# Patient Record
Sex: Female | Born: 1935 | Race: White | Hispanic: No | Smoking: Never smoker
Health system: Southern US, Community
[De-identification: ages and names within clinical notes are randomized; demographics above are authoritative.]

## PROBLEM LIST (undated history)

## (undated) DIAGNOSIS — I639 Cerebral infarction, unspecified: Secondary | ICD-10-CM

## (undated) DIAGNOSIS — I10 Essential (primary) hypertension: Secondary | ICD-10-CM

## (undated) DIAGNOSIS — E7800 Pure hypercholesterolemia, unspecified: Secondary | ICD-10-CM

## (undated) HISTORY — PX: TONSILLECTOMY: SUR1361

---

## 2004-06-20 ENCOUNTER — Ambulatory Visit: Payer: Self-pay | Admitting: Internal Medicine

## 2004-06-24 ENCOUNTER — Ambulatory Visit: Payer: Self-pay | Admitting: Internal Medicine

## 2004-07-21 ENCOUNTER — Ambulatory Visit: Payer: Self-pay | Admitting: Internal Medicine

## 2005-06-23 ENCOUNTER — Ambulatory Visit: Payer: Self-pay | Admitting: Internal Medicine

## 2005-07-19 ENCOUNTER — Inpatient Hospital Stay: Payer: Self-pay | Admitting: Vascular Surgery

## 2005-08-17 ENCOUNTER — Ambulatory Visit: Payer: Self-pay | Admitting: Internal Medicine

## 2006-08-20 ENCOUNTER — Ambulatory Visit: Payer: Self-pay | Admitting: Internal Medicine

## 2007-07-26 ENCOUNTER — Ambulatory Visit: Payer: Self-pay | Admitting: Gastroenterology

## 2007-09-12 ENCOUNTER — Ambulatory Visit: Payer: Self-pay | Admitting: Internal Medicine

## 2008-10-07 ENCOUNTER — Ambulatory Visit: Payer: Self-pay | Admitting: Internal Medicine

## 2009-10-22 ENCOUNTER — Ambulatory Visit: Payer: Self-pay | Admitting: Internal Medicine

## 2010-10-24 ENCOUNTER — Ambulatory Visit: Payer: Self-pay | Admitting: Internal Medicine

## 2011-11-10 ENCOUNTER — Ambulatory Visit: Payer: Self-pay | Admitting: Internal Medicine

## 2012-11-11 ENCOUNTER — Ambulatory Visit: Payer: Self-pay

## 2013-03-10 ENCOUNTER — Emergency Department: Payer: Self-pay | Admitting: Emergency Medicine

## 2013-03-10 LAB — URINALYSIS, COMPLETE
Bacteria: NONE SEEN
Blood: NEGATIVE
Leukocyte Esterase: NEGATIVE
Ph: 5 (ref 4.5–8.0)
Specific Gravity: 1.012 (ref 1.003–1.030)

## 2013-03-10 LAB — CBC
MCH: 32.2 pg (ref 26.0–34.0)
MCHC: 33.9 g/dL (ref 32.0–36.0)
MCV: 95 fL (ref 80–100)
Platelet: 254 10*3/uL (ref 150–440)
RBC: 4.43 10*6/uL (ref 3.80–5.20)
WBC: 8.8 10*3/uL (ref 3.6–11.0)

## 2013-03-10 LAB — COMPREHENSIVE METABOLIC PANEL
Alkaline Phosphatase: 144 U/L — ABNORMAL HIGH (ref 50–136)
Bilirubin,Total: 0.7 mg/dL (ref 0.2–1.0)
Creatinine: 1.24 mg/dL (ref 0.60–1.30)
EGFR (Non-African Amer.): 39 — ABNORMAL LOW
Osmolality: 274 (ref 275–301)
Sodium: 136 mmol/L (ref 136–145)

## 2013-03-10 LAB — LIPASE, BLOOD: Lipase: 207 U/L (ref 73–393)

## 2013-12-17 ENCOUNTER — Observation Stay: Payer: Self-pay

## 2013-12-17 LAB — BASIC METABOLIC PANEL
ANION GAP: 6 — AB (ref 7–16)
BUN: 19 mg/dL — ABNORMAL HIGH (ref 7–18)
CREATININE: 1.16 mg/dL (ref 0.60–1.30)
Calcium, Total: 8.9 mg/dL (ref 8.5–10.1)
Chloride: 105 mmol/L (ref 98–107)
Co2: 31 mmol/L (ref 21–32)
EGFR (African American): 49 — ABNORMAL LOW
EGFR (Non-African Amer.): 42 — ABNORMAL LOW
Glucose: 103 mg/dL — ABNORMAL HIGH (ref 65–99)
OSMOLALITY: 286 (ref 275–301)
Potassium: 4.6 mmol/L (ref 3.5–5.1)
Sodium: 142 mmol/L (ref 136–145)

## 2013-12-17 LAB — CBC WITH DIFFERENTIAL/PLATELET
BASOS PCT: 0.7 %
Basophil #: 0 10*3/uL (ref 0.0–0.1)
Eosinophil #: 0.1 10*3/uL (ref 0.0–0.7)
Eosinophil %: 2.2 %
HCT: 41 % (ref 35.0–47.0)
HGB: 13.6 g/dL (ref 12.0–16.0)
LYMPHS ABS: 1.2 10*3/uL (ref 1.0–3.6)
Lymphocyte %: 18.4 %
MCH: 32.5 pg (ref 26.0–34.0)
MCHC: 33.2 g/dL (ref 32.0–36.0)
MCV: 98 fL (ref 80–100)
MONO ABS: 0.5 x10 3/mm (ref 0.2–0.9)
Monocyte %: 8.5 %
Neutrophil #: 4.5 10*3/uL (ref 1.4–6.5)
Neutrophil %: 70.2 %
Platelet: 249 10*3/uL (ref 150–440)
RBC: 4.18 10*6/uL (ref 3.80–5.20)
RDW: 13.6 % (ref 11.5–14.5)
WBC: 6.4 10*3/uL (ref 3.6–11.0)

## 2013-12-17 LAB — URINALYSIS, COMPLETE
Bacteria: NONE SEEN
Bilirubin,UR: NEGATIVE
Blood: NEGATIVE
Glucose,UR: NEGATIVE mg/dL (ref 0–75)
KETONE: NEGATIVE
Leukocyte Esterase: NEGATIVE
Nitrite: NEGATIVE
PH: 8 (ref 4.5–8.0)
Protein: NEGATIVE
RBC,UR: NONE SEEN /HPF (ref 0–5)
SPECIFIC GRAVITY: 1.005 (ref 1.003–1.030)
Squamous Epithelial: NONE SEEN

## 2013-12-17 LAB — TROPONIN I: Troponin-I: <0.02 ng/mL

## 2013-12-17 LAB — LIPID PANEL
Cholesterol: 154 mg/dL (ref 0–200)
HDL: 41 mg/dL (ref 40–60)
Ldl Cholesterol, Calc: 81 mg/dL (ref 0–100)
Triglycerides: 160 mg/dL (ref 0–200)
VLDL Cholesterol, Calc: 32 mg/dL (ref 5–40)

## 2013-12-18 LAB — CBC WITH DIFFERENTIAL/PLATELET
BASOS ABS: 0 10*3/uL (ref 0.0–0.1)
BASOS PCT: 0.9 %
Eosinophil #: 0.2 10*3/uL (ref 0.0–0.7)
Eosinophil %: 4.4 %
HCT: 37.4 % (ref 35.0–47.0)
HGB: 12.3 g/dL (ref 12.0–16.0)
LYMPHS ABS: 1.4 10*3/uL (ref 1.0–3.6)
Lymphocyte %: 26.6 %
MCH: 31.3 pg (ref 26.0–34.0)
MCHC: 32.9 g/dL (ref 32.0–36.0)
MCV: 95 fL (ref 80–100)
MONOS PCT: 10.9 %
Monocyte #: 0.6 x10 3/mm (ref 0.2–0.9)
NEUTROS ABS: 3 10*3/uL (ref 1.4–6.5)
Neutrophil %: 57.2 %
PLATELETS: 214 10*3/uL (ref 150–440)
RBC: 3.93 10*6/uL (ref 3.80–5.20)
RDW: 13.1 % (ref 11.5–14.5)
WBC: 5.3 10*3/uL (ref 3.6–11.0)

## 2013-12-18 LAB — BASIC METABOLIC PANEL
ANION GAP: 7 (ref 7–16)
BUN: 14 mg/dL (ref 7–18)
Calcium, Total: 8.8 mg/dL (ref 8.5–10.1)
Chloride: 107 mmol/L (ref 98–107)
Co2: 28 mmol/L (ref 21–32)
Creatinine: 1.15 mg/dL (ref 0.60–1.30)
EGFR (African American): 50 — ABNORMAL LOW
GFR CALC NON AF AMER: 43 — AB
GLUCOSE: 84 mg/dL (ref 65–99)
Osmolality: 283 (ref 275–301)
Potassium: 3.8 mmol/L (ref 3.5–5.1)
Sodium: 142 mmol/L (ref 136–145)

## 2014-10-03 NOTE — H&P (Signed)
PATIENT NAME:  Cheyenne Boone, Cheyenne Boone MR#:  [id number] DATE OF BIRTH:  [birth year]  DATE OF ADMISSION:  12/17/2013  ADMITTING PHYSICIAN: Enid Baas, MD   PRIMARY CARE PHYSICIAN: Stann Mainland. Sampson Goon, MD  CHIEF COMPLAINT: Confusion and dizziness.     HISTORY OF PRESENT ILLNESS: Cheyenne Boone is an 81 year old, very pleasant Caucasian female with a past medical history significant for hypertension, hyperlipidemia and transient ischemic attack in the past, comes to the hospital secondary to confusion that started this morning. The patient states she lives at home by herself. She went to use the restroom this morning. She actually uses a bedside commode. After using the commode, she got confused getting back to bed, which was right next to her.  Even now, if you ask detailed questions, the patient cannot remember things, which is acute onset according to the patient, and sometimes knows the words but just cannot speak them out.  She is getting very emotional because of this. Also, she is noted to have elevated blood pressure, systolic greater than 220s and diastolic in 6 110 region so she is being admitted for possible transient ischemic attack.    PAST MEDICAL HISTORY: 1.  Hypertension.  2.  Hyperlipidemia.  3.  History of transient ischemic attack.    PAST SURGICAL HISTORY: Carotid endarterectomy.   ALLERGIES TO MEDICATIONS: No known drug allergies.   CURRENT HOME MEDICATIONS:  Not known at this time.   SOCIAL HISTORY: Lives at home by herself. No smoking or alcohol use.   FAMILY HISTORY: The patient is not sure.    REVIEW OF SYSTEMS:    CONSTITUTIONAL: Denies fever, fatigue or weakness.  EYES: Complains of blurred vision. No inflammation or glaucoma. Had left eye cataract taken out.  ENT: No tinnitus,positive for hearing loss, No epistaxis or discharge.  RESPIRATORY: No cough, wheeze, hemoptysis or chronic obstructive pulmonary disease.  CARDIOVASCULAR: No chest pain, orthopnea, edema,  arrhythmia, palpitations or syncope.  GASTROINTESTINAL: No nausea, vomiting, diarrhea, abdominal pain, hematemesis or melena.  GENITOURINARY: No dysuria, hematuria, renal calculus, frequency or incontinence.  ENDOCRINE: No polyuria, nocturia, thyroid problems, heat or cold intolerance.  HEMATOLOGY: No anemia, easy bruising or bleeding.  SKIN: No acne, rash or lesions.  MUSCULOSKELETAL: No neck, back, shoulder pain, arthritis or gout.  NEUROLOGIC: No numbness, weakness or cerebrovascular accident. History of transient ischemic attack present. Positive for dizziness now.   PSYCHOLOGICAL: No anxiety, insomnia, depression.   PHYSICAL EXAMINATION: VITAL SIGNS: Temperature 98.2 degrees Fahrenheit, pulse 63, respirations 18, blood pressure 224/105, pulse oximetry 96% on room air.  GENERAL: Well-built, well-nourished female lying in bed, not in any acute distress.  HEENT: Normocephalic, atraumatic. Pupils equal, round, reacting to light. Left pupil is postsurgical, mild erythematous conjunctiva noted. Extraocular movements are intact. Oropharynx clear without erythema, mass or exudates.  NECK: Supple. No thyromegaly, JVD or carotid bruits. No lymphadenopathy.  LUNGS: Moving air bilaterally. No wheeze or crackles. No use of accessory muscles for breathing.  CARDIOVASCULAR: S1, S2, regular rate and rhythm. A 2/6 systolic murmur present. No rubs or gallops.  ABDOMEN: Soft, nontender, nondistended. No hepatosplenomegaly. Normal bowel sounds.  EXTREMITIES: No pedal edema. No clubbing or cyanosis. Has 2+ dorsalis pedis pulses felt bilaterally.  SKIN: No acne, rash or lesions.  LYMPHATICS: No cervical or inguinal lymphadenopathy.  NEUROLOGIC: Cranial nerves intact. No focal motor or sensory deficits.  PSYCHOLOGICAL: The patient is awake, alert, oriented x 3.   LABORATORY, DIAGNOSTIC AND RADIOLOGICAL DATA: 1.  WBC 6.4, hemoglobin 13.6, hematocrit 41.0, platelet  count 249.  2.  Sodium 142, potassium 4.6,  chloride 105, bicarbonate 31, BUN 19, creatinine 1.16,  glucose 103, calcium of 8.9. Troponin less than 0.02.  3.  Urinalysis negative for any infection.  4.  CT of the head showing no acute intracranial abnormality. Diffuse areas of low attenuation indicating small vessel white matter ischemic changes which are chronic.  5.  EKG showing normal sinus rhythm. No acute ST-T wave changes.   ASSESSMENT AND PLAN: [name] with past medical history significant for hypertension, hyperlipidemia, being admitted for acute onset of confusion and dizziness and also noted to have elevated blood pressure.  1.  Possible transient ischemic attack. Check lipid profile, admit to telemetry. Will get MRI of the brain, carotid Dopplers and echocardiogram. Continue aspirin, add statin until home medications are verified. Also deep vein thrombosis prophylaxis with subcutaneous heparin.  2.  Malignant hypertension. The patient states her blood pressure medication has been stopped recently, about 6 months ago, as her blood pressure has not been this high before. She is getting p.o. clonidine stat in the ER, will be placed on IV hydralazine p.r.n. at this time.  3.  Hyperlipidemia. Add statin and check lipid profile.  4.  Deep vein thrombosis prophylaxis with subcu heparin.  5.  CODE STATUS: Full code.   TIME SPENT ON ADMISSION: 50 minutes.    ____________________________ Enid Baasadhika Myeesha Shane, MD rk:cs D: 12/17/2013 19:10:34 ET T: 12/17/2013 19:42:01 ET JOB#: [id number]  cc: Enid Baasadhika Hazleigh Mccleave, MD, <Dictator> Stann Mainlandavid P. Sampson GoonFitzgerald, MD Enid BaasADHIKA Letecia Arps MD ELECTRONICALLY SIGNED 12/18/2013 10:51

## 2014-10-03 NOTE — Discharge Summary (Signed)
PATIENT NAME:  Cheyenne Boone, Abbee M MR#:  [id number] DATE OF BIRTH:  [birth year]  DATE OF ADMISSION:  12/17/2013 DATE OF DISCHARGE:  12/19/2013  DISCHARGE DIAGNOSES: 1.  Malignant hypertension.  2.  Transient ischemic attack.   HISTORY OF PRESENT ILLNESS: Please see initial history and physical for details. Briefly, this is an 81 year old female admitted with symptoms of TIA as well as markedly elevated blood pressure.   HOSPITAL COURSE BY ISSUE: The patient's blood pressure was controlled with oral medications. Her work-up for TIA included a MRI which was negative for an acute CVA, carotid Dopplers and echocardiogram. For her blood pressure, she was started on Norvasc at low-dose, 2.5 mg. She was discharged home with home physical therapy and nursing. She is to continue a low sodium diet. She will follow up with Dr. Sampson GoonFitzgerald in 1 to 2 weeks.   TIME SPENT: 35 minutes.   ____________________________ Stann Mainlandavid P. Sampson GoonFitzgerald, MD dpf:sb D: 12/29/2013 11:40:12 ET T: 12/29/2013 11:55:30 ET JOB#: [id number]  cc: Stann Mainlandavid P. Sampson GoonFitzgerald, MD, <Dictator> Jaymie Mckiddy Sampson GoonFITZGERALD MD ELECTRONICALLY SIGNED 12/29/2013 21:25

## 2014-10-31 ENCOUNTER — Emergency Department: Payer: Commercial Managed Care - HMO

## 2014-10-31 ENCOUNTER — Encounter: Payer: Self-pay | Admitting: Emergency Medicine

## 2014-10-31 ENCOUNTER — Inpatient Hospital Stay
Admission: EM | Admit: 2014-10-31 | Discharge: 2014-11-05 | DRG: 481 | Disposition: A | Discharge status: Skilled Nursing Facility | Payer: Commercial Managed Care - HMO | Attending: Internal Medicine | Admitting: Internal Medicine

## 2014-10-31 DIAGNOSIS — R4189 Other symptoms and signs involving cognitive functions and awareness: Secondary | ICD-10-CM

## 2014-10-31 DIAGNOSIS — M810 Age-related osteoporosis without current pathological fracture: Secondary | ICD-10-CM | POA: Diagnosis present

## 2014-10-31 DIAGNOSIS — M25552 Pain in left hip: Secondary | ICD-10-CM | POA: Diagnosis present

## 2014-10-31 DIAGNOSIS — Z8673 Personal history of transient ischemic attack (TIA), and cerebral infarction without residual deficits: Secondary | ICD-10-CM | POA: Diagnosis not present

## 2014-10-31 DIAGNOSIS — S72002A Fracture of unspecified part of neck of left femur, initial encounter for closed fracture: Secondary | ICD-10-CM

## 2014-10-31 DIAGNOSIS — Y92008 Other place in unspecified non-institutional (private) residence as the place of occurrence of the external cause: Secondary | ICD-10-CM

## 2014-10-31 DIAGNOSIS — E785 Hyperlipidemia, unspecified: Secondary | ICD-10-CM | POA: Diagnosis present

## 2014-10-31 DIAGNOSIS — R52 Pain, unspecified: Secondary | ICD-10-CM

## 2014-10-31 DIAGNOSIS — F419 Anxiety disorder, unspecified: Secondary | ICD-10-CM | POA: Diagnosis present

## 2014-10-31 DIAGNOSIS — W1830XA Fall on same level, unspecified, initial encounter: Secondary | ICD-10-CM | POA: Diagnosis present

## 2014-10-31 DIAGNOSIS — D696 Thrombocytopenia, unspecified: Secondary | ICD-10-CM | POA: Diagnosis not present

## 2014-10-31 DIAGNOSIS — S72009A Fracture of unspecified part of neck of unspecified femur, initial encounter for closed fracture: Secondary | ICD-10-CM | POA: Diagnosis present

## 2014-10-31 DIAGNOSIS — I10 Essential (primary) hypertension: Secondary | ICD-10-CM | POA: Diagnosis present

## 2014-10-31 DIAGNOSIS — D62 Acute posthemorrhagic anemia: Secondary | ICD-10-CM | POA: Diagnosis not present

## 2014-10-31 DIAGNOSIS — Y93E2 Activity, laundry: Secondary | ICD-10-CM | POA: Diagnosis not present

## 2014-10-31 DIAGNOSIS — S72142A Displaced intertrochanteric fracture of left femur, initial encounter for closed fracture: Secondary | ICD-10-CM | POA: Diagnosis present

## 2014-10-31 DIAGNOSIS — Z01811 Encounter for preprocedural respiratory examination: Secondary | ICD-10-CM

## 2014-10-31 DIAGNOSIS — R4182 Altered mental status, unspecified: Secondary | ICD-10-CM | POA: Diagnosis not present

## 2014-10-31 HISTORY — DX: Pure hypercholesterolemia, unspecified: E78.00

## 2014-10-31 HISTORY — DX: Essential (primary) hypertension: I10

## 2014-10-31 LAB — URINALYSIS COMPLETE WITH MICROSCOPIC (ARMC ONLY)
BILIRUBIN URINE: NEGATIVE
Bacteria, UA: NONE SEEN
GLUCOSE, UA: NEGATIVE mg/dL
Hgb urine dipstick: NEGATIVE
LEUKOCYTES UA: NEGATIVE
NITRITE: NEGATIVE
PH: 9 — AB (ref 5.0–8.0)
Protein, ur: NEGATIVE mg/dL
RBC / HPF: NONE SEEN RBC/hpf (ref 0–5)
Specific Gravity, Urine: 1.009 (ref 1.005–1.030)
Squamous Epithelial / LPF: NONE SEEN
WBC, UA: NONE SEEN WBC/hpf (ref 0–5)

## 2014-10-31 LAB — BASIC METABOLIC PANEL
Anion gap: 8 (ref 5–15)
BUN: 22 mg/dL — ABNORMAL HIGH (ref 6–20)
CHLORIDE: 105 mmol/L (ref 101–111)
CO2: 29 mmol/L (ref 22–32)
CREATININE: 1.24 mg/dL — AB (ref 0.44–1.00)
Calcium: 9.2 mg/dL (ref 8.9–10.3)
GFR calc Af Amer: 44 mL/min — ABNORMAL LOW (ref >60)
GFR, EST NON AFRICAN AMERICAN: 38 mL/min — AB (ref >60)
Glucose, Bld: 105 mg/dL — ABNORMAL HIGH (ref 65–99)
POTASSIUM: 3.7 mmol/L (ref 3.5–5.1)
SODIUM: 142 mmol/L (ref 135–145)

## 2014-10-31 LAB — CBC
HCT: 41 % (ref 35.0–47.0)
Hemoglobin: 13.6 g/dL (ref 12.0–16.0)
MCH: 31.3 pg (ref 26.0–34.0)
MCHC: 33.2 g/dL (ref 32.0–36.0)
MCV: 94.3 fL (ref 80.0–100.0)
PLATELETS: 241 10*3/uL (ref 150–440)
RBC: 4.35 MIL/uL (ref 3.80–5.20)
RDW: 13.3 % (ref 11.5–14.5)
WBC: 4.8 10*3/uL (ref 3.6–11.0)

## 2014-10-31 LAB — TROPONIN I: Troponin I: <0.03 ng/mL (ref <0.031)

## 2014-10-31 MED ORDER — POLYETHYLENE GLYCOL 3350 17 G PO PACK
17.0000 g | PACK | Freq: Every day | ORAL | Status: DC | PRN
  Administered 2014-11-01 – 2014-11-05 (×2): 17 g via ORAL
  Filled 2014-10-31 (×2): qty 1

## 2014-10-31 MED ORDER — CALCIUM-D 600-400 MG-UNIT PO TABS: 1.0000 | ORAL_TABLET | Freq: Every day | ORAL | Status: DC

## 2014-10-31 MED ORDER — LATANOPROST 0.005 % OP SOLN
1.0000 [drp] | Freq: Every day | OPHTHALMIC | Status: DC
  Administered 2014-10-31 – 2014-11-04 (×5): 1 [drp] via OPHTHALMIC
  Filled 2014-10-31: qty 2.5

## 2014-10-31 MED ORDER — ALPRAZOLAM 0.25 MG PO TABS
0.2500 mg | ORAL_TABLET | Freq: Two times a day (BID) | ORAL | Status: DC | PRN
  Administered 2014-10-31 – 2014-11-05 (×7): 0.25 mg via ORAL
  Filled 2014-10-31 (×7): qty 1

## 2014-10-31 MED ORDER — POLYETHYLENE GLYCOL 3350 17 GM/SCOOP PO POWD: 1.0000 | Freq: Every day | ORAL | Status: DC | PRN

## 2014-10-31 MED ORDER — ONDANSETRON HCL 4 MG/2ML IJ SOLN: 4.0000 mg | Freq: Four times a day (QID) | INTRAMUSCULAR | Status: DC | PRN

## 2014-10-31 MED ORDER — CALCIUM CARBONATE-VITAMIN D 500-200 MG-UNIT PO TABS
1.0000 | ORAL_TABLET | Freq: Every day | ORAL | Status: DC
  Administered 2014-10-31 – 2014-11-05 (×4): 1 via ORAL
  Filled 2014-10-31 (×5): qty 1

## 2014-10-31 MED ORDER — DOCUSATE SODIUM 100 MG PO CAPS: 100.0000 mg | ORAL_CAPSULE | Freq: Every day | ORAL | Status: DC | PRN

## 2014-10-31 MED ORDER — SODIUM CHLORIDE 0.9 % IV SOLN
INTRAVENOUS | Status: DC
  Administered 2014-10-31: 17:00:00 via INTRAVENOUS
  Administered 2014-11-01: 1000 mL via INTRAVENOUS
  Administered 2014-11-01 (×2): via INTRAVENOUS

## 2014-10-31 MED ORDER — HYDROCHLOROTHIAZIDE 12.5 MG PO CAPS
12.5000 mg | ORAL_CAPSULE | ORAL | Status: DC
  Administered 2014-11-03 – 2014-11-05 (×3): 12.5 mg via ORAL
  Filled 2014-10-31 (×4): qty 1

## 2014-10-31 MED ORDER — MORPHINE SULFATE 2 MG/ML IJ SOLN
2.0000 mg | INTRAMUSCULAR | Status: DC | PRN
  Administered 2014-10-31 – 2014-11-03 (×4): 2 mg via INTRAVENOUS
  Filled 2014-10-31 (×4): qty 1

## 2014-10-31 MED ORDER — CEFAZOLIN SODIUM 1-5 GM-% IV SOLN
1.0000 g | Freq: Once | INTRAVENOUS | Status: DC
  Filled 2014-10-31: qty 50

## 2014-10-31 MED ORDER — ACETAMINOPHEN 650 MG RE SUPP: 650.0000 mg | Freq: Four times a day (QID) | RECTAL | Status: DC | PRN

## 2014-10-31 MED ORDER — ONDANSETRON HCL 4 MG PO TABS: 4.0000 mg | ORAL_TABLET | Freq: Four times a day (QID) | ORAL | Status: DC | PRN

## 2014-10-31 MED ORDER — SIMVASTATIN 20 MG PO TABS
20.0000 mg | ORAL_TABLET | Freq: Every day | ORAL | Status: DC
  Administered 2014-10-31 – 2014-11-04 (×5): 20 mg via ORAL
  Filled 2014-10-31 (×5): qty 1

## 2014-10-31 MED ORDER — FENTANYL CITRATE (PF) 100 MCG/2ML IJ SOLN
INTRAMUSCULAR | Status: AC
  Administered 2014-10-31: 50 ug via INTRAVENOUS
  Filled 2014-10-31: qty 2

## 2014-10-31 MED ORDER — ASPIRIN EC 81 MG PO TBEC
81.0000 mg | DELAYED_RELEASE_TABLET | ORAL | Status: DC
  Administered 2014-11-02 – 2014-11-05 (×4): 81 mg via ORAL
  Filled 2014-10-31 (×4): qty 1

## 2014-10-31 MED ORDER — ONDANSETRON HCL 4 MG/2ML IJ SOLN: 4.0000 mg | Freq: Once | INTRAMUSCULAR | Status: DC

## 2014-10-31 MED ORDER — FENTANYL CITRATE (PF) 100 MCG/2ML IJ SOLN
50.0000 ug | Freq: Once | INTRAMUSCULAR | Status: AC
Start: 2014-10-31 — End: 2014-10-31
  Administered 2014-10-31: 50 ug via INTRAVENOUS

## 2014-10-31 MED ORDER — MORPHINE SULFATE 2 MG/ML IJ SOLN: 2.0000 mg | Freq: Once | INTRAMUSCULAR | Status: DC

## 2014-10-31 MED ORDER — ACETAMINOPHEN 325 MG PO TABS: 650.0000 mg | ORAL_TABLET | Freq: Four times a day (QID) | ORAL | Status: DC | PRN

## 2014-10-31 MED ORDER — AMLODIPINE BESYLATE 5 MG PO TABS
2.5000 mg | ORAL_TABLET | ORAL | Status: DC
  Administered 2014-11-01: 2.5 mg via ORAL
  Administered 2014-11-03: 07:00:00 via ORAL
  Administered 2014-11-04 – 2014-11-05 (×2): 2.5 mg via ORAL
  Filled 2014-10-31 (×5): qty 1

## 2014-10-31 NOTE — ED Notes (Signed)
Patient lost her balance doing laundry this AM and fell. Patient c/o left hip pain. Unable to move LLE.

## 2014-10-31 NOTE — Consult Note (Signed)
Reason for consult, left comminuted intertrochanteric hip fracture  History patient suffered a fall at home today she is uncertain as to the reason but denies loss of consciousness. She is normally active and takes care of herself. She does use a cane. 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 She denies prodromal symptoms of left hip pain.  Past history is not really pertinent she has not had any prior orthopedic treatments.  Examination: Skin around the thigh is intact on the left with the leg markedly shortened and externally rotated approximately 45. Sensation to the foot is intact with brisk capillary refill and palpable dorsalis pedis and posterior tibial pulses. There is no peripheral edema.  X-ray examination shows a very comminuted fracture with subtrochanteric extension of the intertrochanteric fracture consistent with a very unstable fracture pattern.  Impression complex proximal femur fracture  Plan is for several medullary device fixation with expectation of some shortening along with healing. risks, benefits, possible complications were discussed with the patient in particular blood clots and infection, failure of fixation and shortening of the leg.

## 2014-10-31 NOTE — ED Notes (Signed)
Pt laying in bed with family at bedside. Questions answered. Will ask for update by doctor. Vitals reassessed. Will continue to monitor.

## 2014-10-31 NOTE — H&P (Addendum)
[facility] [place] [facility]   PATIENT NAME: Cheyenne Boone    MR#:  [id number]  DATE OF BIRTH:  01-28-1924  DATE OF ADMISSION:  10/31/2014  PRIMARY CARE PHYSICIAN:  Dr. Marcello Fennel    REQUESTING/REFERRING PHYSICIAN: Dr. Shaune Pollack  CHIEF COMPLAINT:   Chief Complaint  Patient presents with  . Hip Pain   S/p Fall and left hip pain and noted to have a left hip fracture.   HISTORY OF PRESENT ILLNESS:  Cheyenne Boone  is a 81 y.o. female with a known history of hypertension, hyperlipidemia, osteoporosis, history of previous CVA, who presented to the hospital after a mechanical fall and noted to have a left hip fracture. Patient denied any prodromal symptoms prior to her fall like any chest pain shortness of breath palpitations dizziness or any true syncope.  Patient basically lost her footing and fell to the floor and complained of left hip pain and was brought to the emergency room and noted to have a left hip fracture.  PAST MEDICAL HISTORY:   Past Medical History  Diagnosis Date  . Hypertension   . Hypercholesteremia     PAST SURGICAL HISTORY:  History reviewed. No pertinent past surgical history.  SOCIAL HISTORY:   History  Substance Use Topics  . Smoking status: Never Smoker   . Smokeless tobacco: Not on file  . Alcohol Use: No    FAMILY HISTORY:   No significant family hx.  Both mother & father died from old age.   DRUG ALLERGIES:  No Known Allergies  REVIEW OF SYSTEMS:   Review of Systems  Constitutional: Negative for fever and weight loss.  HENT: Negative for congestion, nosebleeds and tinnitus.   Eyes: Negative for blurred vision, double vision and redness.  Respiratory: Negative for cough, hemoptysis and shortness of breath.   Cardiovascular: Negative for chest pain, orthopnea, leg swelling and PND.  Gastrointestinal: Negative for nausea, vomiting, abdominal pain, diarrhea and melena.  Genitourinary: Negative for dysuria,  urgency and hematuria.  Musculoskeletal: Negative for joint pain and falls.  Neurological: Negative for dizziness, tingling, sensory change, focal weakness, seizures, weakness and headaches.  Endo/Heme/Allergies: Negative for polydipsia. Does not bruise/bleed easily.  Psychiatric/Behavioral: Negative for depression and memory loss. The patient is not nervous/anxious.     MEDICATIONS AT HOME:   Prior to Admission medications   Medication Sig Start Date End Date Taking? Authorizing Provider  ALPRAZolam (XANAX) 0.25 MG tablet Take 0.25 mg by mouth 2 (two) times daily as needed. For sleep 09/07/14  Yes Historical Provider, MD  amLODipine (NORVASC) 2.5 MG tablet Take 2.5 mg by mouth every morning. 08/06/14  Yes Historical Provider, MD  aspirin EC 81 MG tablet Take 81 mg by mouth every morning.   Yes Historical Provider, MD  Calcium Carbonate-Vitamin D (CALCIUM-D) 600-400 MG-UNIT TABS Take 1 tablet by mouth daily.   Yes Historical Provider, MD  docusate sodium (COLACE) 100 MG capsule Take 100 mg by mouth daily as needed for mild constipation.   Yes Historical Provider, MD  hydrochlorothiazide (MICROZIDE) 12.5 MG capsule Take 12.5 mg by mouth every morning. 08/06/14  Yes Historical Provider, MD  latanoprost (XALATAN) 0.005 % ophthalmic solution Place 1 drop into the left eye at bedtime. 08/20/14  Yes Historical Provider, MD  polyethylene glycol powder (GLYCOLAX/MIRALAX) powder Take 1 Container by mouth daily as needed for mild constipation or moderate constipation.   Yes Historical Provider, MD  simvastatin (ZOCOR) 20 MG tablet Take 20 mg by mouth at bedtime.  08/06/14  Yes Historical Provider, MD      VITAL SIGNS:  Blood pressure 105/49, pulse 67, temperature 98.1 F (36.7 C), temperature source Oral, resp. rate 20, height 4' (1.219 m), weight 53.071 kg (117 lb), SpO2 100 %.  PHYSICAL EXAMINATION:  Physical Exam  GENERAL:  81 y.o.-year-old patient lying in the bed with no acute distress.  EYES:  Pupils equal, round, reactive to light and accommodation. No scleral icterus. Extraocular muscles intact.  HEENT: Head atraumatic, normocephalic. Oropharynx and nasopharynx clear.  No oropharyngeal erythema, moist oral mucosa.  NECK:  Supple, no jugular venous distention. No thyroid enlargement, no tenderness. No Lymphadenopathy.  LUNGS: Normal breath sounds bilaterally, no wheezing, rales, rhonchi. No use of accessory muscles of respiration.  CARDIOVASCULAR: S1, S2 normal. + II/VI SEM at RSB, no rubs, gallops or clicks.  ABDOMEN: Soft, nontender, nondistended. Bowel sounds present. No organomegaly or mass.  EXTREMITIES: No pedal edema, cyanosis, or clubbing.  Left lower ext. Externally rotated and shortened due to hip fracutre. +2 pedal and radial pulses bilaterally. NEUROLOGIC: Cranial nerves II through XII are intact. No focal Motor or sensory deficits appreciated b/l PSYCHIATRIC: The patient is alert and oriented x 3. Good affect.  SKIN: No obvious rash, lesion, or ulcer.   LABORATORY PANEL:   CBC  Recent Labs Lab 10/31/14 0912  WBC 4.8  HGB 13.6  HCT 41.0  PLT 241   ------------------------------------------------------------------------------------------------------------------  Chemistries   Recent Labs Lab 10/31/14 0912  NA 142  K 3.7  CL 105  CO2 29  GLUCOSE 105*  BUN 22*  CREATININE 1.24*  CALCIUM 9.2   ------------------------------------------------------------------------------------------------------------------  Cardiac Enzymes  Recent Labs Lab 10/31/14 0912  TROPONINI <0.03   ------------------------------------------------------------------------------------------------------------------  RADIOLOGY:  Dg Chest 1 View  10/31/2014   CLINICAL DATA:  Left intertrochanteric femoral neck fracture due to a fall and her laundry room at home earlier today. Preoperative respiratory evaluation.  EXAM: CHEST  1 VIEW  COMPARISON:  No prior chest x-ray.  Visualized lung bases on CT abdomen 03/10/2013.  FINDINGS: Cardiac silhouette upper normal in size to mildly enlarged for the supine technique. Thoracic aorta atherosclerotic. Large hiatal hernia as noted previously. Hilar and mediastinal contours otherwise unremarkable. Lungs clear. Bronchovascular markings normal. Pulmonary vascularity normal. No visible pleural effusions. No pneumothorax. Old healed fractures involving right ribs. Generalized osseous demineralization.  IMPRESSION: 1. Borderline to mild cardiomegaly. No acute cardiopulmonary disease. 2. Large hiatal hernia.   Electronically Signed   By: Hulan Saas M.D.   On: 10/31/2014 09:54   Dg Hip Unilat With Pelvis 2-3 Views Left  10/31/2014   CLINICAL DATA:  81 year old who fell earlier today while at home in her laundry room. Injury to the left hip with inability to move the left lower extremity. Initial encounter.  EXAM: LEFT HIP (WITH PELVIS) 2-3 VIEWS  COMPARISON:  None.  FINDINGS: Comminuted intertrochanteric left femoral neck fracture. Severe osseous demineralization. Mild joint space narrowing.  Included AP pelvis demonstrates no fractures elsewhere. Symmetric mild joint space narrowing in the contralateral right hip. Sacroiliac joints and symphysis pubis intact. Aortoiliofemoral atherosclerosis.  IMPRESSION: Comminuted intertrochanteric left femoral neck fracture. Severe osseous demineralization.   Electronically Signed   By: Hulan Saas M.D.   On: 10/31/2014 09:52    EKG:                   IMPRESSION AND PLAN:   81 year old female with past medical history of hypertension, hyperlipidemia, osteoporosis, history of previous CVA, anxiety, who  presented to the hospital after a mechanical fall and noted to have a left hip fracture.  #1 preoperative medical evaluation - patient is likely a low to moderate risk for noncardiac surgery. There is no absolute contraindication to surgery at this time. Patient's EKG has been reviewed  and shows no acute ST or T-wave changes.  - I would proceed with surgery as per orthopedics  #2 left hip fracture - get orthopedic consult and continue care as per them  #3 hypertension stable and controlled - continue hydrochlorothiazide and amlodipine  #4 anxiety continue as needed Xanax  #5 hyperlipidemia continue simvastatin  #6 Glaucoma - continue with latanoprost eyedrops    All the records are reviewed and case discussed with ED provider. Management plans discussed with the patient, family and they are in agreement.  CODE STATUS: Full  TOTAL TIME TAKING CARE OF THIS PATIENT: 45 minutes.    Houston SirenSAINANI,VIVEK J M.D on 10/31/2014 at 12:53 PM  Between 7am to 6pm - Pager - [phone]  After 6pm go to [email or web address] - password [email or web address] [facility]  [facility]  Office  [phone]  CC: Primary care physician; No primary care provider on file.

## 2014-10-31 NOTE — ED Notes (Signed)
Patient transported to X-ray 

## 2014-10-31 NOTE — ED Notes (Signed)
Patient oxygen saturations decreased to 86%. Patient placed on 3L [place] and oxygen saturations elevated to 93%. Shaune PollackLord, MD notified.

## 2014-10-31 NOTE — ED Provider Notes (Signed)
[facility] Emergency Department Provider Note   ____________________________________________  Time seen: On arrival I have reviewed the triage vital signs and the triage nursing note.  HISTORY  Chief Complaint Hip Pain   Historian Patient  HPI Cheyenne Boone is a 81 y.o. female who came in with left hip pain after a fall from standing. She lost her balance doing laundry this morning. She is having moderate pain. Location is left hip. There are no other traumatic injuries including no head injury neck injury chest or abdominal injury.    Past Medical History  Diagnosis Date  . Hypertension   . Hypercholesteremia     There are no active problems to display for this patient.   History reviewed. No pertinent past surgical history.  No current outpatient prescriptions on file.  Allergies Review of patient's allergies indicates no known allergies.  History reviewed. No pertinent family history.  Social History History  Substance Use Topics  . Smoking status: Never Smoker   . Smokeless tobacco: Not on file  . Alcohol Use: No    Review of Systems  Constitutional: Negative for fever. Eyes: Negative for visual changes. ENT: Negative for sore throat. Cardiovascular: Negative for chest pain. Respiratory: Negative for shortness of breath. Gastrointestinal: Negative for abdominal pain, vomiting and diarrhea. Genitourinary: Negative for dysuria. Musculoskeletal: Negative for back pain. Skin: Negative for rash. Neurological: Negative for headaches, focal weakness or numbness.  ____________________________________________   PHYSICAL EXAM:  VITAL SIGNS: ED Triage Vitals  Enc Vitals Group     BP 10/31/14 0903 181/98 mmHg     Pulse Rate 10/31/14 0903 88     Resp 10/31/14 0903 16     Temp 10/31/14 0903 98.1 F (36.7 C)     Temp Source 10/31/14 0903 Oral     SpO2 10/31/14 0903 98 %     Weight 10/31/14 0903 117 lb (53.071 kg)     Height  10/31/14 0903 4' (1.219 m)     Head Cir --      Peak Flow --      Pain Score --      Pain Loc --      Pain Edu? --      Excl. in GC? --      Constitutional: Alert and oriented. Well appearing and in no distress. Heart appearing Eyes: Conjunctivae are normal. PERRL. Normal extraocular movements. ENT   Head: Normocephalic and atraumatic.   Nose: No congestion/rhinnorhea.   Mouth/Throat: Mucous membranes are moist.   Neck: No stridor. No midline C-spine tenderness. Cardiovascular: Normal rate, regular rhythm.  No murmurs, rubs, or gallops. Respiratory: Normal respiratory effort without tachypnea nor retractions. Breath sounds are clear and equal bilaterally. No wheezes/rales/rhonchi. Gastrointestinal: Soft and nontender. No distention.  Genitourinary: Musculoskeletal: Left leg shortened and externally rotated. Normal distal pulses. Pelvis stable. Other 3 extremities without traumatic injury. Neurologic:  Normal speech and language. No gross focal neurologic deficits are appreciated. Skin:  Skin is warm, dry and intact. No rash noted. Psychiatric: Mood and affect are normal. Speech and behavior are normal. Patient exhibits appropriate insight and judgment.  ____________________________________________   EKG I, Governor Rooks, MD, the attending physician have personally viewed and interpreted this ECG.  66 bpm. Normal sinus rhythm. Normal axis. Normal QRS. Normal ST and T-wave ____________________________________________  LABS (pertinent positives/negatives)  Urinalysis negative Troponin negative BUN 22 and creatinine 1.24, electrolytes without significant abnormality White blood count 4.8 and hemoglobin 13.6  ____________________________________________  RADIOLOGY Radiologist results reviewed  Chest x-ray one view: No acute cardio pulmonary disease  Left hip with pelvis: comminuted intertrochanteric left femoral neck  fracture __________________________________________  PROCEDURES  Procedure(s) performed: None Critical Care performed: None  ____________________________________________   ED COURSE / ASSESSMENT AND PLAN  Pertinent labs & imaging results that were available during my care of the patient were reviewed by me and considered in my medical decision making (see chart for details).   Patient is neurovascularly intact. She has had no head injury, neck injury, chest or abdominal injury. She does have a closed left hip fracture. Consult at Dr. Rosita KeaMenz with orthopedics who will see her in the hospital. Hospitalist will admit, I discussed with Dr. Cherlynn KaiserSainani. _ __________________________________________   FINAL CLINICAL IMPRESSION(S) / ED DIAGNOSES   Final diagnoses:  Pre-op chest exam  Closed left hip fracture, initial encounter      Governor Rooksebecca Myangel Summons, MD 10/31/14 1204

## 2014-10-31 NOTE — ED Notes (Signed)
Report to receiving nurse, Shannin 

## 2014-11-01 ENCOUNTER — Inpatient Hospital Stay: Payer: Commercial Managed Care - HMO | Admitting: Anesthesiology

## 2014-11-01 ENCOUNTER — Inpatient Hospital Stay: Payer: Commercial Managed Care - HMO

## 2014-11-01 ENCOUNTER — Encounter
Admission: EM | Disposition: A | Discharge status: Skilled Nursing Facility | Payer: Self-pay | Source: Home / Self Care | Attending: Internal Medicine

## 2014-11-01 ENCOUNTER — Encounter: Payer: Self-pay | Admitting: Anesthesiology

## 2014-11-01 HISTORY — PX: FEMUR IM NAIL: SHX1597

## 2014-11-01 LAB — BASIC METABOLIC PANEL
Anion gap: 6 (ref 5–15)
BUN: 20 mg/dL (ref 6–20)
CO2: 27 mmol/L (ref 22–32)
Calcium: 8.5 mg/dL — ABNORMAL LOW (ref 8.9–10.3)
Chloride: 104 mmol/L (ref 101–111)
Creatinine, Ser: 0.96 mg/dL (ref 0.44–1.00)
GFR calc non Af Amer: 51 mL/min — ABNORMAL LOW (ref >60)
GFR, EST AFRICAN AMERICAN: 59 mL/min — AB (ref >60)
GLUCOSE: 126 mg/dL — AB (ref 65–99)
POTASSIUM: 3.6 mmol/L (ref 3.5–5.1)
Sodium: 137 mmol/L (ref 135–145)

## 2014-11-01 LAB — CBC
HEMATOCRIT: 33 % — AB (ref 35.0–47.0)
Hemoglobin: 11.1 g/dL — ABNORMAL LOW (ref 12.0–16.0)
MCH: 31.9 pg (ref 26.0–34.0)
MCHC: 33.5 g/dL (ref 32.0–36.0)
MCV: 95.1 fL (ref 80.0–100.0)
PLATELETS: 191 10*3/uL (ref 150–440)
RBC: 3.47 MIL/uL — AB (ref 3.80–5.20)
RDW: 13.3 % (ref 11.5–14.5)
WBC: 8.4 10*3/uL (ref 3.6–11.0)

## 2014-11-01 SURGERY — INSERTION, INTRAMEDULLARY ROD, FEMUR
Anesthesia: Spinal | Laterality: Left

## 2014-11-01 MED ORDER — MAGNESIUM HYDROXIDE 400 MG/5ML PO SUSP: 30.0000 mL | Freq: Every day | ORAL | Status: DC | PRN

## 2014-11-01 MED ORDER — IPRATROPIUM-ALBUTEROL 0.5-2.5 (3) MG/3ML IN SOLN
3.0000 mL | RESPIRATORY_TRACT | Status: DC | PRN
  Administered 2014-11-01 – 2014-11-03 (×2): 3 mL via RESPIRATORY_TRACT
  Filled 2014-11-01 (×2): qty 3

## 2014-11-01 MED ORDER — CEFAZOLIN SODIUM 1-5 GM-% IV SOLN
INTRAVENOUS | Status: DC | PRN
  Administered 2014-11-01: 1 g via INTRAVENOUS

## 2014-11-01 MED ORDER — PROPOFOL 10 MG/ML IV BOLUS
INTRAVENOUS | Status: DC | PRN
  Administered 2014-11-01: 20 ug via INTRAVENOUS
  Administered 2014-11-01: 10 ug via INTRAVENOUS

## 2014-11-01 MED ORDER — METHYLPREDNISOLONE SODIUM SUCC 125 MG IJ SOLR
80.0000 mg | Freq: Two times a day (BID) | INTRAMUSCULAR | Status: DC
  Administered 2014-11-01 – 2014-11-05 (×8): 80 mg via INTRAVENOUS
  Filled 2014-11-01 (×8): qty 2

## 2014-11-01 MED ORDER — BISACODYL 10 MG RE SUPP
10.0000 mg | Freq: Every day | RECTAL | Status: DC | PRN
  Administered 2014-11-02 – 2014-11-05 (×2): 10 mg via RECTAL
  Filled 2014-11-01 (×2): qty 1

## 2014-11-01 MED ORDER — MAGNESIUM CITRATE PO SOLN
1.0000 | Freq: Once | ORAL | Status: AC | PRN
  Filled 2014-11-01: qty 296

## 2014-11-01 MED ORDER — METOCLOPRAMIDE HCL 10 MG PO TABS
5.0000 mg | ORAL_TABLET | Freq: Three times a day (TID) | ORAL | Status: DC | PRN
  Filled 2014-11-01: qty 1

## 2014-11-01 MED ORDER — DOCUSATE SODIUM 100 MG PO CAPS
100.0000 mg | ORAL_CAPSULE | Freq: Two times a day (BID) | ORAL | Status: DC
  Administered 2014-11-01 – 2014-11-05 (×9): 100 mg via ORAL
  Filled 2014-11-01 (×8): qty 1

## 2014-11-01 MED ORDER — ONDANSETRON HCL 4 MG/2ML IJ SOLN: 4.0000 mg | Freq: Once | INTRAMUSCULAR | Status: AC | PRN

## 2014-11-01 MED ORDER — METOCLOPRAMIDE HCL 5 MG/ML IJ SOLN: 5.0000 mg | Freq: Three times a day (TID) | INTRAMUSCULAR | Status: DC | PRN

## 2014-11-01 MED ORDER — CEFTRIAXONE SODIUM IN DEXTROSE 20 MG/ML IV SOLN
1.0000 g | INTRAVENOUS | Status: DC
  Administered 2014-11-01 – 2014-11-04 (×4): 1 g via INTRAVENOUS
  Filled 2014-11-01 (×5): qty 50

## 2014-11-01 MED ORDER — FENTANYL CITRATE (PF) 100 MCG/2ML IJ SOLN: 25.0000 ug | INTRAMUSCULAR | Status: DC | PRN

## 2014-11-01 MED ORDER — MENTHOL 3 MG MT LOZG
1.0000 | LOZENGE | OROMUCOSAL | Status: DC | PRN
  Filled 2014-11-01: qty 9

## 2014-11-01 MED ORDER — MIDAZOLAM HCL 2 MG/2ML IJ SOLN
INTRAMUSCULAR | Status: DC | PRN
  Administered 2014-11-01: 0.5 mg via INTRAVENOUS

## 2014-11-01 MED ORDER — PHENOL 1.4 % MT LIQD
1.0000 | OROMUCOSAL | Status: DC | PRN
  Filled 2014-11-01: qty 177

## 2014-11-01 MED ORDER — ALUM & MAG HYDROXIDE-SIMETH 200-200-20 MG/5ML PO SUSP: 30.0000 mL | ORAL | Status: DC | PRN

## 2014-11-01 MED ORDER — FENTANYL CITRATE (PF) 100 MCG/2ML IJ SOLN
INTRAMUSCULAR | Status: DC | PRN
  Administered 2014-11-01: 25 ug via INTRAVENOUS

## 2014-11-01 MED ORDER — PROPOFOL INFUSION 10 MG/ML OPTIME
INTRAVENOUS | Status: DC | PRN
  Administered 2014-11-01: 25 ug/kg/min via INTRAVENOUS

## 2014-11-01 MED ORDER — CEFAZOLIN SODIUM-DEXTROSE 2-3 GM-% IV SOLR
2.0000 g | Freq: Four times a day (QID) | INTRAVENOUS | Status: AC
  Administered 2014-11-01 – 2014-11-02 (×3): 2 g via INTRAVENOUS
  Filled 2014-11-01 (×3): qty 50

## 2014-11-01 MED ORDER — ENOXAPARIN SODIUM 40 MG/0.4ML ~~LOC~~ SOLN
40.0000 mg | SUBCUTANEOUS | Status: DC
  Administered 2014-11-02: 40 mg via SUBCUTANEOUS
  Filled 2014-11-01: qty 0.4

## 2014-11-01 MED ORDER — SODIUM CHLORIDE 0.9 % IV SOLN
INTRAVENOUS | Status: DC
  Administered 2014-11-01 – 2014-11-05 (×7): via INTRAVENOUS

## 2014-11-01 MED ORDER — PHENYLEPHRINE HCL 10 MG/ML IJ SOLN
INTRAMUSCULAR | Status: DC | PRN
  Administered 2014-11-01 (×6): 100 ug via INTRAVENOUS

## 2014-11-01 MED ORDER — HYDROCODONE-ACETAMINOPHEN 5-325 MG PO TABS
1.0000 | ORAL_TABLET | Freq: Four times a day (QID) | ORAL | Status: DC | PRN
  Administered 2014-11-01 – 2014-11-02 (×3): 1 via ORAL
  Filled 2014-11-01 (×3): qty 1

## 2014-11-01 SURGICAL SUPPLY — 44 items
130 degree 11mmx340mm left nail ×3 IMPLANT
AFFIXUS DISTAL SHORT GRADUATED DRILL ×3 IMPLANT
AFFIXUS VERSANAIL THREADED GUIDE PIN ×3 IMPLANT
BIOMET AFFIXUS BALL NOSE GUIDE WIRE ×3 IMPLANT
BIT DRILL 4.3MMS DISTAL GRDTED (BIT) ×1 IMPLANT
CANISTER SUCT 1200ML W/VALVE (MISCELLANEOUS) ×3 IMPLANT
CHLORAPREP W/TINT 26ML (MISCELLANEOUS) ×3 IMPLANT
DRAPE SHEET LG 3/4 BI-LAMINATE (DRAPES) ×3 IMPLANT
DRAPE SURG 17X11 SM STRL (DRAPES) ×3 IMPLANT
DRAPE U-SHAPE 47X51 STRL (DRAPES) ×3 IMPLANT
DRILL 4.3MMS DISTAL GRADUATED (BIT) ×3
ELECT CAUTERY BLADE 6.4 (BLADE) ×3 IMPLANT
GAUZE SPONGE 4X4 12PLY STRL (GAUZE/BANDAGES/DRESSINGS) ×3 IMPLANT
GLOVE BIOGEL M 6.5 STRL (GLOVE) ×3 IMPLANT
GLOVE BIOGEL PI IND STRL 7.0 (GLOVE) ×1 IMPLANT
GLOVE BIOGEL PI IND STRL 9 (GLOVE) ×1 IMPLANT
GLOVE BIOGEL PI INDICATOR 7.0 (GLOVE) ×2
GLOVE BIOGEL PI INDICATOR 9 (GLOVE) ×2
GLOVE SURG ORTHO 9.0 STRL STRW (GLOVE) ×3 IMPLANT
GOWN SPECIALTY ULTRA XL (MISCELLANEOUS) ×3 IMPLANT
GOWN STRL REUS W/ TWL LRG LVL3 (GOWN DISPOSABLE) ×1 IMPLANT
GOWN STRL REUS W/TWL LRG LVL3 (GOWN DISPOSABLE) ×2
GOWN STRL REUS W/TWL LRG LVL4 (GOWN DISPOSABLE) ×3 IMPLANT
GUIDEPIN VERSANAIL DSP 3.2X444 ×3 IMPLANT
GUIDEWIRE BALL NOSE 80CM (WIRE) ×3 IMPLANT
HFN LH 130 DEG 11MM X 340MM (Nail) ×3 IMPLANT
HIP FRAC NAIL LAG SCR 10.5X100 (Orthopedic Implant) ×2 IMPLANT
IV NS 500ML (IV SOLUTION) ×2
IV NS 500ML BAXH (IV SOLUTION) ×1 IMPLANT
KIT RM TURNOVER STRD PROC AR (KITS) ×3 IMPLANT
MAT BLUE FLOOR 46X72 FLO (MISCELLANEOUS) ×3 IMPLANT
NEEDLE FILTER BLUNT 18X 1/2SAF (NEEDLE) ×2
NEEDLE FILTER BLUNT 18X1 1/2 (NEEDLE) ×1 IMPLANT
PACK HIP COMPR (MISCELLANEOUS) ×3 IMPLANT
PAD GROUND ADULT SPLIT (MISCELLANEOUS) IMPLANT
SCREW BONE CORTICAL 5.0X38 (Screw) ×3 IMPLANT
SCREW CANN THRD AFF 10.5X100 (Orthopedic Implant) ×1 IMPLANT
STAPLER SKIN PROX 35W (STAPLE) ×3 IMPLANT
SUT VIC AB 1 CT1 36 (SUTURE) ×3 IMPLANT
SUT VIC AB 2-0 CT1 (SUTURE) ×3 IMPLANT
SYRINGE 10CC LL (SYRINGE) ×3 IMPLANT
TAPE MICROFOAM 4IN (TAPE) ×3 IMPLANT
affixus cortical  bone screw ×2 IMPLANT
biomet lag screw 10.5x100mm ×2 IMPLANT

## 2014-11-01 NOTE — OR Nursing (Signed)
Patient has a spinal anesthesia sensation level at t10 no movement of lower extrmeties

## 2014-11-01 NOTE — Progress Notes (Signed)
CSW consult for SNF. Will await PT eval with recommendations for appropriate level of care. CSW will follow. Dellie BurnsJosie Maya Scholer, MSW, LCSW [phone] (weekend coverage)

## 2014-11-01 NOTE — Progress Notes (Signed)
   11/01/14 0955  Clinical Encounter Type  Visited With Patient and family together  Visit Type Initial;Spiritual support;Pre-op  Referral From Nurse  Consult/Referral To Chaplain  Spiritual Encounters  Spiritual Needs Prayer;Emotional  Stress Factors  Patient Stress Factors Other (Comment)  Chaplain went to unit looking for patient but patient had been transported to surgery. Located patietn and family in pre-op. Had prayer with patient and offered a compassionate presence. Chaplain Neliah Cuyler A. Shiraz Bastyr Ext. [phone]

## 2014-11-01 NOTE — Anesthesia Preprocedure Evaluation (Addendum)
Anesthesia Evaluation  Patient identified by MRN, date of birth, ID band Patient awake    Reviewed: Allergy & Precautions, NPO status , Patient's Chart, lab work & pertinent test results  Airway Mallampati: III  TM Distance: >3 FB Neck ROM: Full    Dental  (+) Upper Dentures, Lower Dentures   Pulmonary          Cardiovascular hypertension, Pt. on medications + Valvular Problems/Murmurs (murmer as a child)     Neuro/Psych    GI/Hepatic GERD- (hx of GERD no meds now)  ,  Endo/Other    Renal/GU      Musculoskeletal   Abdominal   Peds  Hematology   Anesthesia Other Findings   Reproductive/Obstetrics                           Anesthesia Physical Anesthesia Plan  ASA: III  Anesthesia Plan: Spinal   Post-op Pain Management:    Induction:   Airway Management Planned:   Additional Equipment:   Intra-op Plan:   Post-operative Plan:   Informed Consent: I have reviewed the patients History and Physical, chart, labs and discussed the procedure including the risks, benefits and alternatives for the proposed anesthesia with the patient or authorized representative who has indicated his/her understanding and acceptance.     Plan Discussed with:   Anesthesia Plan Comments:         Anesthesia Quick Evaluation

## 2014-11-01 NOTE — Progress Notes (Signed)
Pt  NPO for surgery today. Skin prep and bath done in preparation. Only needed something for pain x 1 at begin of the shift. Reports the pain is only bad when she turns. Remains SR with 1st degree on the monitor.

## 2014-11-01 NOTE — Progress Notes (Signed)
In or, main issue is the hip being cared for by ortho so please call me with issues today. Am labs ordered, no charge for today

## 2014-11-01 NOTE — OR Nursing (Signed)
500 ml bolus complete pules 61 and B/P at 105/55

## 2014-11-01 NOTE — OR Nursing (Signed)
Patient at T12 level

## 2014-11-01 NOTE — Transfer of Care (Signed)
Immediate Anesthesia Transfer of Care Note  Patient: Cheyenne Boone  Procedure(s) Performed: Procedure(s): INTRAMEDULLARY (IM) NAIL FEMORAL (Left)  Patient Location: PACU  Anesthesia Type:Spinal  Level of Consciousness: awake and patient cooperative  Airway & Oxygen Therapy: Patient Spontanous Breathing and Patient connected to nasal cannula oxygen  Post-op Assessment: Report given to RN and Post -op Vital signs reviewed and stable  Post vital signs: Reviewed and stable  Last Vitals:  Filed Vitals:   11/01/14 0814  BP: 123/55  Pulse: 65  Temp: 36.9 C  Resp: 18    Complications: No apparent anesthesia complications

## 2014-11-01 NOTE — Anesthesia Postprocedure Evaluation (Signed)
  Anesthesia Post-op Note  Patient: Cheyenne Boone  Procedure(s) Performed: Procedure(s): INTRAMEDULLARY (IM) NAIL FEMORAL (Left)  Anesthesia type:Spinal  Patient location: PACU  Post pain: Pain level controlled  Post assessment: Post-op Vital signs reviewed, Patient's Cardiovascular Status Stable, Respiratory Function Stable, Patent Airway and No signs of Nausea or vomiting  Post vital signs: Reviewed and stable  Last Vitals:  Filed Vitals:   11/01/14 1130  BP: 141/79  Pulse: 58  Temp: 37.8 C  Resp: 7    Level of consciousness: awake, alert  and patient cooperative  Complications: No apparent anesthesia complications

## 2014-11-01 NOTE — Op Note (Signed)
10/31/2014 - 11/01/2014  11:32 AM  PATIENT:  Cheyenne Boone  81 y.o. female  PRE-OPERATIVE DIAGNOSIS:  left hip fracture comminuted left intertrochanteric hip fracture  POST-OPERATIVE DIAGNOSIS:  Same  PROCEDURE:  Procedure(s): INTRAMEDULLARY (IM) NAIL FEMORAL (Left)  SURGEON: Leitha SchullerMichael J Taison Celani, MD  ASSISTANTS: None  ANESTHESIA:   spinal  EBL:  Total I/O In: 1947.5 [I.V.:1947.5] Out: 500 [Urine:350; Blood:150]  BLOOD ADMINISTERED:none  DRAINS: none   LOCAL MEDICATIONS USED:  NONE  SPECIMEN:  No Specimen  DISPOSITION OF SPECIMEN:  N/A  COUNTS:  YES  TOURNIQUET:  * No tourniquets in log *  IMPLANTS: Biomet affixes nail left 11 x 3 40, 100 mm lag screw, 38 mm distal interlocking screw.  DICTATION: Reubin Milan.Dragon Dictation  patient was brought to the operating room and after adequate spinal anesthesia The right leg was placed in the well-leg holder and the left foot in the traction boot. Traction was applied and C-arm showed acceptable reduction of the fracture in both AP and lateral projections. The hip was prepped and draped using a Barrier drape method. A proximal incision was made and a guide were inserted into the tip of the trochanter and proximal reaming carried out. The guide long guidewire was inserted down the canal. Measurements were made off of this and a 340 mm rod was chosen reaming was carried out to 13 mm and an 11 x 340 rod was obtained and inserted down the canal. It was placed at the appropriate depth and a small lateral incision was made and a guide were inserted into essentially center center had. Measurements made off of this and 100 mm drill drilling was carried out followed by the placement of the 100 mm lag screw. The set screw was placed proximally with a quarter turn rotation to allow for compression. Compression device was then applied and the fracture compressed with related release of traction. The distal lag screw was placed using a freehand technique with  through this slotted distal screw hole drilling measuring and placing the screw with AP lateral views proximally AP view distally. The wounds were thoroughly irrigated and closed with #1 Vicryl for the deep tissue 2-0 Vicryl substantially and skin staples. Xeroform 4 x 4's ABDs and tape applied. The patient center comes stable condition  PLAN OF CARE: Continue as inpatient  PATIENT DISPOSITION:  PACU - hemodynamically stable.

## 2014-11-01 NOTE — Anesthesia Procedure Notes (Signed)
Spinal Patient location during procedure: OR Preanesthetic Checklist Completed: patient identified, site marked, surgical consent, pre-op evaluation, timeout performed, IV checked, risks and benefits discussed and monitors and equipment checked Spinal Block Patient position: right lateral decubitus Prep: Betadine Patient monitoring: heart rate, continuous pulse ox, blood pressure and cardiac monitor Approach: midline Location: L4-5 Injection technique: single-shot Needle Needle type: Whitacre and Introducer  Needle gauge: 25 G Needle length: 9 cm Assessment Sensory level: T8 Additional Notes Negative paresthesia. Negative blood return. Positive free-flowing CSF. Expiration date of kit checked and confirmed. Patient tolerated procedure well, without complications.

## 2014-11-02 LAB — CBC WITH DIFFERENTIAL/PLATELET
Basophils Absolute: 0 10*3/uL (ref 0–0.1)
Basophils Relative: 0 %
EOS ABS: 0 10*3/uL (ref 0–0.7)
Eosinophils Relative: 0 %
HCT: 27.3 % — ABNORMAL LOW (ref 35.0–47.0)
HEMOGLOBIN: 9.3 g/dL — AB (ref 12.0–16.0)
Lymphocytes Relative: 6 %
Lymphs Abs: 0.5 10*3/uL — ABNORMAL LOW (ref 1.0–3.6)
MCH: 32.4 pg (ref 26.0–34.0)
MCHC: 33.9 g/dL (ref 32.0–36.0)
MCV: 95.6 fL (ref 80.0–100.0)
MONO ABS: 0.4 10*3/uL (ref 0.2–0.9)
MONOS PCT: 5 %
NEUTROS PCT: 89 %
Neutro Abs: 7.5 10*3/uL — ABNORMAL HIGH (ref 1.4–6.5)
Platelets: 149 10*3/uL — ABNORMAL LOW (ref 150–440)
RBC: 2.86 MIL/uL — AB (ref 3.80–5.20)
RDW: 13.3 % (ref 11.5–14.5)
WBC: 8.3 10*3/uL (ref 3.6–11.0)

## 2014-11-02 LAB — BASIC METABOLIC PANEL
Anion gap: 4 — ABNORMAL LOW (ref 5–15)
BUN: 20 mg/dL (ref 6–20)
CO2: 24 mmol/L (ref 22–32)
Calcium: 7.5 mg/dL — ABNORMAL LOW (ref 8.9–10.3)
Chloride: 105 mmol/L (ref 101–111)
Creatinine, Ser: 1.18 mg/dL — ABNORMAL HIGH (ref 0.44–1.00)
GFR calc Af Amer: 46 mL/min — ABNORMAL LOW (ref >60)
GFR calc non Af Amer: 40 mL/min — ABNORMAL LOW (ref >60)
GLUCOSE: 200 mg/dL — AB (ref 65–99)
Potassium: 3.9 mmol/L (ref 3.5–5.1)
Sodium: 133 mmol/L — ABNORMAL LOW (ref 135–145)

## 2014-11-02 MED ORDER — ENOXAPARIN SODIUM 30 MG/0.3ML ~~LOC~~ SOLN
30.0000 mg | SUBCUTANEOUS | Status: DC
  Administered 2014-11-03 – 2014-11-04 (×2): 30 mg via SUBCUTANEOUS
  Filled 2014-11-02 (×2): qty 0.3

## 2014-11-02 NOTE — Evaluation (Signed)
Physical Therapy Evaluation Patient Details Name: Heath GoldKatherine M Roderick MRN: [id number] DOB: 08/25/1923 Today's Date: 11/02/2014   History of Present Illness  Pt is an 81 y.o. female s/p fall with L hip pain.  Imaging demonstrating comminuted intertrochanteric L femoral neck fx with subtrochanteric extension.  Imaging also shows large hital hernia.  Pt s/p L IMN femoral 11/01/14.  Clinical Impression  Currently pt demonstrates impairments with L LE strength, pain, and limitations with functional mobility.  Prior to admission, pt was independent without AD in home (used walking stick in community).  Pt lives alone in 1 level home with 3 steps to enter with R railing (pt's family member lives next door).  Currently pt is max assist supine to sit and max assist x1-2 for transfers.  Pt would benefit from skilled PT to address above noted impairments and functional limitations.  Recommend pt discharge to STR when medically appropriate.     Follow Up Recommendations SNF    Equipment Recommendations  Rolling walker with 5" wheels    Recommendations for Other Services       Precautions / Restrictions Precautions Precautions: Fall Restrictions Weight Bearing Restrictions: Yes LLE Weight Bearing: Weight bearing as tolerated      Mobility  Bed Mobility Overal bed mobility: Needs Assistance Bed Mobility: Supine to Sit     Supine to sit: Max assist;HOB elevated     General bed mobility comments: assist for trunk and B LE's  Transfers Overall transfer level: Needs assistance  Treatment:  Pt required vc's and demo for correct technique with transfers noted below. Transfers: Sit to/from [employer]Stand;Stand Pivot Transfers Sit to Stand: Max assist;+2 physical assistance (trialed RW 2x's but pt unable to maintain standing d/t L hip pain) Stand pivot transfers: Mod assist;Max assist;+2 physical assistance (stand pivot transfer bed to commode; pt then stood from commode with max assist (commode removed)  and pt assisted to sitting on recliner placed directly behind pt)       General transfer comment: pt unable to WB through L LE d/t significant L hip pain  Ambulation/Gait             General Gait Details: Not appropriate at this time (pt unable to WB through L LE d/t significant L hip pain)  Stairs            Wheelchair Mobility    Modified Rankin (Stroke Patients Only)       Balance                                             Pertinent Vitals/Pain Pain Assessment: 0-10 Pain Score: 10-Worst pain ever (2/10 at rest) Pain Location: L hip Pain Descriptors / Indicators: Sharp;Shooting Pain Intervention(s): Limited activity within patient's tolerance;Monitored during session;Repositioned (RN notified of pain)  HR 71-87 bpm during session O2 >94% on 4 L/min via nasal cannula during session.    Home Living Family/patient expects to be discharged to:: Skilled nursing facility Living Arrangements: Alone   Type of Home: House Home Access: Stairs to enter Entrance Stairs-Rails: Right Entrance Stairs-Number of Steps: 3 Home Layout: One level Home Equipment: Walker - 4 wheels;Cane - single point;Bedside commode;Hand held shower head;Shower seat Additional Comments: [facility]    Prior Function Level of Independence: Independent with assistive device(s)         Comments: Pt independent without AD in home but uses walking  stick in community.     Hand Dominance        Extremity/Trunk Assessment   Upper Extremity Assessment: Defer to OT evaluation           Lower Extremity Assessment: LLE deficits/detail   LLE Deficits / Details: L DF at least 3+/5     Communication   Communication: HOH (R hearing aide)  Cognition Arousal/Alertness: Awake/alert Behavior During Therapy: WFL for tasks assessed/performed Overall Cognitive Status: Within Functional Limits for tasks assessed                      General Comments  Pt's family  present beginning of session but left for rest of session.  Pt shaking after getting to chair and nursing came to assess pt (pt's BP 122/102 and vitals as noted above); pt reporting being really cold.    Exercises  Deferred d/t pt requesting to toilet right away.      Assessment/Plan    PT Assessment Patient needs continued PT services  PT Diagnosis Difficulty walking;Acute pain   PT Problem List Decreased strength;Decreased activity tolerance;Decreased balance;Decreased mobility;Decreased knowledge of use of DME;Decreased knowledge of precautions;Pain  PT Treatment Interventions DME instruction;Gait training;Stair training;Functional mobility training;Therapeutic activities;Therapeutic exercise;Patient/family education;Balance training;Neuromuscular re-education   PT Goals (Current goals can be found in the Care Plan section) Acute Rehab PT Goals Patient Stated Goal: To have less pain PT Goal Formulation: With patient Time For Goal Achievement: 11/16/14 Potential to Achieve Goals: Good    Frequency BID   Barriers to discharge   Level of assist    Co-evaluation               End of Session Equipment Utilized During Treatment: Gait belt;Oxygen Activity Tolerance: Patient limited by pain Patient left: in chair;with call bell/phone within reach;with chair alarm set;with nursing/sitter in room Nurse Communication: Mobility status;Precautions (Pt's pain levels with mobility)         Time: 1610-9604 PT Time Calculation (min) (ACUTE ONLY): 50 min   Charges:   PT Evaluation $Initial PT Evaluation Tier I: 1 Procedure PT Treatments $Therapeutic Activity: 8-22 mins   PT G CodesHendricks Limes Nov 20, 2014, 10:37 AM Hendricks Limes, PT [phone]

## 2014-11-02 NOTE — Care Management Note (Signed)
Case Management Note  Patient Details  Name: Cheyenne Boone MRN: [id number] Date of Birth: 07/30/1923  Subjective/Objective:   CSW following for SNF. CM will sign off.                 Action/Plan:   Expected Discharge Date:                  Expected Discharge Plan:  Skilled Nursing Facility  In-House Referral:  Clinical Social Work  Discharge planning Services     Post Acute Care Choice:    Choice offered to:     DME Arranged:    DME Agency:     HH Arranged:    HH Agency:     Status of Service:  Completed, signed off  Medicare Important Message Given:  Yes Date Medicare IM Given:  11/02/14 Medicare IM give by:  Gweneth DimitriLisa Kavon Valenza Date Additional Medicare IM Given:    Additional Medicare Important Message give by:     If discussed at Long Length of Stay Meetings, dates discussed:    Additional Comments:  Cheyenne MemosLisa M Josealfredo Adkins, RN 11/02/2014, 2:09 PM

## 2014-11-02 NOTE — Progress Notes (Signed)
Pt SR with 1st degree heart block. POD 1. Minimal pain except with movement. Medicated at HS. BP has been low this shift, but back within normal range.  [facility].

## 2014-11-02 NOTE — Progress Notes (Signed)
Physical Therapy Treatment Patient Details Name: Cheyenne GoldKatherine M Corriveau MRN: [id number] DOB: 06/23/1923 Today's Date: 11/02/2014    History of Present Illness Pt is an 81 y.o. female s/p fall with L hip pain.  Imaging demonstrating comminuted intertrochanteric L femoral neck fx with subtrochanteric extension.  Imaging also shows large hital hernia.  Pt s/p L IMN femoral 11/01/14.    PT Comments    Pt with improved pain control this afternoon and able to ambulate 6 feet with RW and 2 assist.  Will continue to progress pt per pt tolerance.  Recommend pt discharge to STR when medically appropriate.  Follow Up Recommendations  SNF     Equipment Recommendations  Rolling walker with 5" wheels    Recommendations for Other Services       Precautions / Restrictions Precautions Precautions: Fall Restrictions Weight Bearing Restrictions: Yes LLE Weight Bearing: Weight bearing as tolerated    Mobility  Bed Mobility Overal bed mobility: Needs Assistance Bed Mobility: Sit to Supine       Sit to supine: Mod assist;Max assist;+2 for physical assistance   General bed mobility comments: assist for trunk and B LE's  Transfers Overall transfer level: Needs assistance Equipment used: Rolling walker (2 wheeled) Transfers: Sit to/from [employer]Stand;Stand Pivot Transfers Sit to Stand: Mod assist;+2 physical assistance Stand pivot transfers: Min assist;Mod assist;+2 physical assistance       General transfer comment: vc's required for hand and feet placement  Ambulation/Gait Ambulation/Gait assistance: Min assist;Mod assist;+2 physical assistance Ambulation Distance (Feet): 6 Feet Assistive device: Rolling walker (2 wheeled)       General Gait Details: antalgic gait; decreased stance time L LE; vc's required for stepping technique, walker advancement, and to increase UE support through Longs Drug StoresW   Stairs            Wheelchair Mobility    Modified Rankin (Stroke Patients Only)        Balance                                    Cognition Arousal/Alertness: Awake/alert Behavior During Therapy: WFL for tasks assessed/performed Overall Cognitive Status: Within Functional Limits for tasks assessed                      Exercises      General Comments  Pt's family present for session.      Pertinent Vitals/Pain Pain Assessment: 0-10 Pain Score: 6  (none at rest) Pain Location: L hip Pain Intervention(s): Limited activity within patient's tolerance;Monitored during session;Premedicated before session;Repositioned  Vitals stable and WFL throughout treatment session (pt on 2 L/min O2 via nasal cannula).     Home Living                      Prior Function            PT Goals (current goals can now be found in the care plan section) Acute Rehab PT Goals Patient Stated Goal: To have less pain PT Goal Formulation: With patient Time For Goal Achievement: 11/16/14 Potential to Achieve Goals: Good Progress towards PT goals: Progressing toward goals    Frequency  BID    PT Plan Current plan remains appropriate    Co-evaluation             End of Session Equipment Utilized During Treatment: Gait belt;Oxygen Activity Tolerance: Patient limited by pain (although  significant improved compared to this AM) Patient left: in bed;with call bell/phone within reach;with bed alarm set;with family/visitor present (B heels elevated via pillow)     Time: 1355-1420 PT Time Calculation (min) (ACUTE ONLY): 25 min  Charges:  $Gait Training: 8-22 mins $Therapeutic Activity: 8-22 mins                    G CodesHendricks Limes 11/09/14, 2:33 PM Hendricks Limes, PT [phone]

## 2014-11-02 NOTE — Progress Notes (Signed)
Pts VSS. PRN meds given for pain 10/10. On reassessment pain 0/10.  Will continue to monitor.

## 2014-11-02 NOTE — Progress Notes (Signed)
Pts  BP 108/55, Per MD Hande hold 2 scheduled BP (see MAR) medications this morning.

## 2014-11-02 NOTE — Clinical Social Work Placement (Signed)
   CLINICAL SOCIAL WORK PLACEMENT  NOTE  Date:  11/02/2014  Patient Details  Name: Cheyenne Boone MRN: [id number] Date of Birth: 04/10/1924  Clinical Social Work is seeking post-discharge placement for this patient at the Skilled  Nursing Facility level of care (*CSW will initial, date and re-position this form in  chart as items are completed):  Yes   Patient/family provided with [place] Clinical Social Work Department's list of facilities offering this level of care within the geographic area requested by the patient (or if unable, by the patient's family).  Yes   Patient/family informed of their freedom to choose among providers that offer the needed level of care, that participate in Medicare, Medicaid or managed care program needed by the patient, have an available bed and are willing to accept the patient.  Yes   Patient/family informed of [facility]'s ownership interest in [facility] and [facility], as well as of the fact that they are under no obligation to receive care at these facilities.  PASRR submitted to EDS on 11/02/14     PASRR number received on 11/02/14     Existing PASRR number confirmed on       FL2 transmitted to all facilities in geographic area requested by pt/family on 11/02/14     FL2 transmitted to all facilities within larger geographic area on       Patient informed that his/her managed care company has contracts with or will negotiate with certain facilities, including the following:            Patient/family informed of bed offers received.  Patient chooses bed at       Physician recommends and patient chooses bed at      Patient to be transferred to   on  .  Patient to be transferred to facility by       Patient family notified on   of transfer.  Name of family member notified:        PHYSICIAN Please sign FL2     Additional Comment:    _______________________________________________ Haig ProphetMorgan, Graison Leinberger G, LCSW 11/02/2014,  12:39 PM

## 2014-11-02 NOTE — Progress Notes (Signed)
PROGRESS NOTE  STORMEY WILBORN ZOX:[id number] DOB: 1923/09/30 DOA: 10/31/2014 PCP: No primary care provider on file.  HPI/Recap of past 24 hours: 81 y/o f with hx of HTN, Hyperlipidemia, osteoporosis, previous CVA admitted with left Hip fracture following a mechanical fall. Pt underwent ORIF yesterday. This am slightly confused according to daughter. Denies pain      Objective: BP 108/55 mmHg  Pulse 57  Temp(Src) 97.6 F (36.4 C) (Oral)  Resp 18  Ht  (1.499 m)  Wt 62.596 kg (138 lb)  BMI 27.86 kg/m2  SpO2 97%  Intake/Output Summary (Last 24 hours) at 11/02/14 0811 Last data filed at 11/02/14 0651  Gross per 24 hour  Intake   2279 ml  Output    930 ml  Net   1349 ml   Filed Weights   10/31/14 0903 10/31/14 1533 11/01/14 1303  Weight: 53.071 kg (117 lb) 53.071 kg (117 lb) 62.596 kg (138 lb)    Exam:   General:  Not in distress  Cardiovascular: S1 S2  Respiratory: Clear to auscultation  Abdomen: Soft. Non tender  Neuro:No Focal  Musculoskeltal; S/p Left hip ORIF  Data Reviewed: Basic Metabolic Panel:  Recent Labs Lab 10/31/14 0912 11/01/14 0422 11/02/14 0355  NA 142 137 133*  K 3.7 3.6 3.9  CL 105 104 105  CO2 GLUCOSE 105* 126* 200*  BUN 22* 20 20  CREATININE 1.24* 0.96 1.18*  CALCIUM 9.2 8.5* 7.5*   Liver Function Tests: No results for input(s): AST, ALT, ALKPHOS, BILITOT, PROT, ALBUMIN in the last 168 hours. No results for input(s): LIPASE, AMYLASE in the last 168 hours. No results for input(s): AMMONIA in the last 168 hours. CBC:  Recent Labs Lab 10/31/14 0912 11/01/14 0422 11/02/14 0355  WBC 4.8 8.4 8.3  NEUTROABS  --   --  7.5*  HGB 13.6 11.1* 9.3*  HCT 41.0 33.0* 27.3*  MCV 94.3 95.1 95.6  PLT 241 191 149*   Cardiac Enzymes:    Recent Labs Lab 10/31/14 0912  TROPONINI <0.03   BNP (last 3 results) No results for input(s): BNP in the last 8760 hours.  ProBNP (last 3 results) No results for  input(s): PROBNP in the last 8760 hours.  CBG: No results for input(s): GLUCAP in the last 168 hours.  No results found for this or any previous visit (from the past 240 hour(s)).   Studies: Dg C-arm 1-60 Min-no Report  11/01/2014   CLINICAL DATA: left hip fracture   C-ARM 1-60 MINUTES  Fluoroscopy was utilized by the requesting physician.  No radiographic  interpretation.     Scheduled Meds: . amLODipine  2.5 mg Oral BH-q7a  . aspirin EC  81 mg Oral BH-q7a  . calcium-vitamin D  1 tablet Oral Daily  . cefTRIAXone (ROCEPHIN)  IV  1 g Intravenous Q24H  . docusate sodium  100 mg Oral BID  . enoxaparin (LOVENOX) injection  40 mg Subcutaneous Q24H  . hydrochlorothiazide  12.5 mg Oral BH-q7a  . latanoprost  1 drop Left Eye QHS  . methylPREDNISolone (SOLU-MEDROL) injection  80 mg Intravenous Q12H  . simvastatin  20 mg Oral QHS    Continuous Infusions: . sodium chloride 75 mL/hr at 11/02/14 0508    Assessment/Plan: 1Left proximal Intertrochanteric fracture with Sub trochnatric extension; s/p ORIF Doing well post -op Continue pain management  On lovenox 2 HTN: On Amlodipine. 3 Hyperlipidemia;  On Simvastatin 4 Elevated Sugar; Continue to monitor 5 Physical Therapy  Code Status: Full  Family Communication: D/w daughter      Barbette ReichmannHANDE,Gwendolen Hewlett   11/02/2014, 8:11 AM  LOS: 2 days

## 2014-11-02 NOTE — Progress Notes (Signed)
   Subjective: 1 Day Post-Op Procedure(s) (LRB): INTRAMEDULLARY (IM) NAIL FEMORAL (Left) Patient reports pain as 0 on 0-10 scale.   Patient is well, and has had no acute complaints or problems We will start therapy today.  Plan is to go Skilled nursing facility after hospital stay.  Objective: Vital signs in last 24 hours: Temp:  [97.5 F (36.4 C)-100.6 F (38.1 C)] 97.6 F (36.4 C) (05/23 0732) Pulse Rate:  [46-110] 57 (05/23 0732) Resp:  [10-18] 18 (05/23 0732) BP: (83-190)/(45-83) 108/55 mmHg (05/23 0732) SpO2:  [89 %-100 %] 97 % (05/23 0732) Weight:  [62.596 kg (138 lb)] 62.596 kg (138 lb) (05/22 1303)  Intake/Output from previous day: 05/22 0701 - 05/23 0700 In: 3326.5 [P.O.:240; I.V.:3036.5; IV Piggyback:50] Out: 930 [Urine:780; Blood:150] Intake/Output this shift:     Recent Labs  10/31/14 0912 11/01/14 0422 11/02/14 0355  HGB 13.6 11.1* 9.3*    Recent Labs  11/01/14 0422 11/02/14 0355  WBC 8.4 8.3  RBC 3.47* 2.86*  HCT 33.0* 27.3*  PLT 191 149*    Recent Labs  11/01/14 0422 11/02/14 0355  NA 137 133*  K 3.6 3.9  CL 104 105  CO2 27 24  BUN 20 20  CREATININE 0.96 1.18*  GLUCOSE 126* 200*  CALCIUM 8.5* 7.5*   No results for input(s): LABPT, INR in the last 72 hours.  EXAM General - Patient is Alert, Appropriate and Oriented Extremity - Neurologically intact Neurovascular intact Sensation intact distally Intact pulses distally Dorsiflexion/Plantar flexion intact No cellulitis present Dressing - dressing C/D/I Motor Function - intact, moving foot and toes well on exam.   Past Medical History  Diagnosis Date  . Hypertension   . Hypercholesteremia     Assessment/Plan:   1 Day Post-Op Procedure(s) (LRB): INTRAMEDULLARY (IM) NAIL FEMORAL (Left) Active Problems:   Hip fracture  Estimated body mass index is 27.86 kg/(m^2) as calculated from the following:   Height as of this encounter: 4\' 11"  (1.499 m).   Weight as of this encounter:  62.596 kg (138 lb). Advance diet Up with therapy  Recheck labs in the am  DVT Prophylaxis - Lovenox Weight-Bearing as tolerated to left leg D/C O2 and Pulse OX and try on Room Air  T. Cranston Neighborhris Gaines, PA-C [facility] 11/02/2014, 7:43 AM

## 2014-11-02 NOTE — Progress Notes (Signed)
Pharmacy Note - Anticoagulation  Patient with orders for enoxaparin 40mg  SQ daily for DVT prophylaxis  Estimated Creatinine Clearance: 26.5 mL/min (by C-G formula based on Cr of 1.18).  Per policy, will transition to enoxaparin 30mg  SQ daily for CrCl < 3330ml/min  Garlon HatchetJody Jaymie Misch, PharmD Clinical Pharmacist 11/02/2014

## 2014-11-02 NOTE — Clinical Social Work Note (Signed)
Clinical Social Work Assessment  Patient Details  Name: Cheyenne Boone MRN: [id number] Date of Birth: 1924/04/26  Date of referral:  11/02/14               Reason for consult:  Facility Placement                Permission sought to share information with:  Facility Sport and exercise psychologist, Family Supports Permission granted to share information::  Yes, Verbal Permission Granted  Name::      Cheyenne Boone Daughter ([place]) [phone]  Agency::     Relationship::     Contact Information:     Housing/Transportation Living arrangements for the past 2 months:  Single Family Home Source of Information:  Adult Children Patient Interpreter Needed:  None Criminal Activity/Legal Involvement Pertinent to Current Situation/Hospitalization:  No - Comment as needed Significant Relationships:  Adult Children Lives with:  Self Do you feel safe going back to the place where you live?  Yes Need for family participation in patient care:  Yes (Comment)  Care giving concerns:  Patient lives alone in [place].    Social Worker assessment / plan: Holiday representative (CSW) met with patient and her oldest daughter Cheyenne Boone at bedside. Patient was sitting in chair and just worked with PT. CSW introduced self and explained role of [place] department. Per daughter patient lives alone in [place]. Daughter Cheyenne Boone lives in [place] and patient has 2 other children that live in the [place] area. CSW explained SNF process to daughter and patient. They are agreeable for SNF search in [facility] and prefer [place]. CSW explained to daughter and patient that [facility] will have to authorize SNF stay. Daughter verbalized her understanding.   FL2 complete and faxed out. Per Maudie Mercury admission coordinator at [facility] they can accept patient. CSW made patient and daughter aware. Plan is for patient to D/C to [facility] when medically stable. CSW made Amy with [facility] aware of above. CSW will continue to follow and assist  as needed.   Blima Rich, [place] [phone]  Employment status:  Retired Nurse, adult PT Recommendations:  [place] / Referral to community resources:  [place]  Patient/Family's Response to care: Patient and daughter Cheyenne Boone are agreeable to [employer]   Patient/Family's Understanding of and Emotional Response to Diagnosis, Current Treatment, and Prognosis: Daughter thanked CSW for visit and assisting with placement.   Emotional Assessment Appearance:  Appears stated age Attitude/Demeanor/Rapport:    Affect (typically observed):  Accepting, Quiet Orientation:  Oriented to Self, Oriented to Place, Oriented to  Time, Oriented to Situation Alcohol / Substance use:  Not Applicable Psych involvement (Current and /or in the community):  No (Comment)  Discharge Needs  Concerns to be addressed:  Discharge Planning Concerns Readmission within the last 30 days:  No Current discharge risk:  Lives alone Barriers to Discharge:  Insurance Authorization   Elwyn Reach 11/02/2014, 12:40 PM

## 2014-11-03 ENCOUNTER — Encounter
Admission: RE | Admit: 2014-11-03 | Discharge: 2014-11-03 | Disposition: A | Discharge status: Home / Self Care | Payer: Commercial Managed Care - HMO | Source: Ambulatory Visit | Attending: Internal Medicine | Admitting: Internal Medicine

## 2014-11-03 LAB — CBC
HEMATOCRIT: 24.8 % — AB (ref 35.0–47.0)
HEMOGLOBIN: 8.2 g/dL — AB (ref 12.0–16.0)
MCH: 31.6 pg (ref 26.0–34.0)
MCHC: 33.1 g/dL (ref 32.0–36.0)
MCV: 95.2 fL (ref 80.0–100.0)
Platelets: 153 10*3/uL (ref 150–440)
RBC: 2.61 MIL/uL — ABNORMAL LOW (ref 3.80–5.20)
RDW: 13.4 % (ref 11.5–14.5)
WBC: 13.6 10*3/uL — ABNORMAL HIGH (ref 3.6–11.0)

## 2014-11-03 NOTE — Progress Notes (Signed)
PROGRESS NOTE  Cheyenne Boone ZOX:[id number] DOB: 06/06/1924 DOA: 10/31/2014 PCP: No primary care provider on file.   81 y/o f with hx of HTN, Hyperlipidemia, osteoporosis, previous CVA admitted with left Hip fracture following a mechanical fall. Pt underwent ORIF 11/01/14 Denies pain      Objective: BP 144/67 mmHg  Pulse 69  Temp(Src) 97.8 F (36.6 C) (Oral)  Resp 18  Ht 4\' 11"  (1.499 m)  Wt 62.596 kg (138 lb)  BMI 27.86 kg/m2  SpO2 98%  Intake/Output Summary (Last 24 hours) at 11/03/14 0802 Last data filed at 11/03/14 0700  Gross per 24 hour  Intake    730 ml  Output   2900 ml  Net  -2170 ml   Filed Weights   10/31/14 0903 10/31/14 1533 11/01/14 1303  Weight: 53.071 kg (117 lb) 53.071 kg (117 lb) 62.596 kg (138 lb)    Exam:   General:  Not in distress  Cardiovascular: S1 S2  Respiratory: Clear to auscultation  Abdomen: Soft. Non tender  Neuro:No Focal  Musculoskeltal; S/p Left hip ORIF  Data Reviewed: Basic Metabolic Panel:  Recent Labs Lab 10/31/14 0912 11/01/14 0422 11/02/14 0355  NA 142 137 133*  K 3.7 3.6 3.9  CL 105 104 105  CO2 29 27 24   GLUCOSE 105* 126* 200*  BUN 22* 20 20  CREATININE 1.24* 0.96 1.18*  CALCIUM 9.2 8.5* 7.5*   Liver Function Tests: No results for input(s): AST, ALT, ALKPHOS, BILITOT, PROT, ALBUMIN in the last 168 hours. No results for input(s): LIPASE, AMYLASE in the last 168 hours. No results for input(s): AMMONIA in the last 168 hours. CBC:  Recent Labs Lab 10/31/14 0912 11/01/14 0422 11/02/14 0355 11/03/14 0626  WBC 4.8 8.4 8.3 13.6*  NEUTROABS  --   --  7.5*  --   HGB 13.6 11.1* 9.3* 8.2*  HCT 41.0 33.0* 27.3* 24.8*  MCV 94.3 95.1 95.6 95.2  PLT 241 191 149* 153   Cardiac Enzymes:    Recent Labs Lab 10/31/14 0912  TROPONINI <0.03   BNP (last 3 results) No results for input(s): BNP in the last 8760 hours.  ProBNP (last 3 results) No results for input(s): PROBNP in the last 8760  hours.  CBG: No results for input(s): GLUCAP in the last 168 hours.  No results found for this or any previous visit (from the past 240 hour(s)).   Studies: No results found.  Scheduled Meds: . amLODipine  2.5 mg Oral BH-q7a  . aspirin EC  81 mg Oral BH-q7a  . calcium-vitamin D  1 tablet Oral Daily  . cefTRIAXone (ROCEPHIN)  IV  1 g Intravenous Q24H  . docusate sodium  100 mg Oral BID  . enoxaparin (LOVENOX) injection  30 mg Subcutaneous Q24H  . hydrochlorothiazide  12.5 mg Oral BH-q7a  . latanoprost  1 drop Left Eye QHS  . methylPREDNISolone (SOLU-MEDROL) injection  80 mg Intravenous Q12H  . simvastatin  20 mg Oral QHS    Continuous Infusions: . sodium chloride 75 mL/hr at 11/03/14 [id number]    Assessment/Plan: 1Left proximal Intertrochanteric fracture with Sub trochnatric extension; s/p ORIF Doing well post -op Continue pain management  On lovenox  Continue Physical Therapy 2 HTN: Stable-On Amlodipine. 3 Hyperlipidemia;  On Simvastatin 4 Elevated Sugar; Continue to monitor 5 Post -op anemia/ Low Platelets; Continue to monitor  6 For Rehab soon   Code Status: Full        Cheyenne Boone   11/03/2014, 8:02 AM  LOS: 3 days

## 2014-11-03 NOTE — Progress Notes (Signed)
   Subjective: 2 Days Post-Op Procedure(s) (LRB): INTRAMEDULLARY (IM) NAIL FEMORAL (Left) Patient reports pain as mild.   Patient is well, and has had no acute complaints or problems We will continue therapy today.  Plan is to go Skilled nursing facility after hospital stay.  Objective: Vital signs in last 24 hours: Temp:  [97.6 F (36.4 C)-98.1 F (36.7 C)] 97.7 F (36.5 C) (05/24 0437) Pulse Rate:  [57-83] 77 (05/24 0437) Resp:  [16-18] 18 (05/24 0437) BP: (108-140)/(55-82) 140/78 mmHg (05/24 0437) SpO2:  [96 %-100 %] 97 % (05/24 0437)  Intake/Output from previous day: 05/23 0701 - 05/24 0700 In: 730 [P.O.:480] Out: 2900 [Urine:2900] Intake/Output this shift:     Recent Labs  10/31/14 0912 11/01/14 0422 11/02/14 0355 11/03/14 0626  HGB 13.6 11.1* 9.3* 8.2*    Recent Labs  11/02/14 0355 11/03/14 0626  WBC 8.3 13.6*  RBC 2.86* 2.61*  HCT 27.3* 24.8*  PLT 149* 153    Recent Labs  11/01/14 0422 11/02/14 0355  NA 137 133*  K 3.6 3.9  CL 104 105  CO2 27 24  BUN 20 20  CREATININE 0.96 1.18*  GLUCOSE 126* 200*  CALCIUM 8.5* 7.5*   No results for input(s): LABPT, INR in the last 72 hours.  EXAM General - Patient is Alert, Appropriate and Oriented Extremity - Neurologically intact Neurovascular intact Sensation intact distally Intact pulses distally Dorsiflexion/Plantar flexion intact Dressing - dressing C/D/I Motor Function - intact, moving foot and toes well on exam.   Past Medical History  Diagnosis Date  . Hypertension   . Hypercholesteremia     Assessment/Plan:   2 Days Post-Op Procedure(s) (LRB): INTRAMEDULLARY (IM) NAIL FEMORAL (Left) Active Problems:   Hip fracture  Estimated body mass index is 27.86 kg/(m^2) as calculated from the following:   Height as of this encounter: 4\' 11"  (1.499 m).   Weight as of this encounter: 62.596 kg (138 lb). Advance diet Up with therapy  Recheck Hgb in the am  DVT Prophylaxis -  Lovenox Weight-Bearing as tolerated to left leg D/C O2 and Pulse OX and try on Room Air  T. Cranston Neighborhris Gaines, PA-C [facility] 11/03/2014, 7:13 AM

## 2014-11-03 NOTE — Progress Notes (Signed)
Clinical Social Worker (CSW) met with patient and daughter Juliann Pulse at bedside. CSW answered all questions regarding SNF at [facility]. Plan is for patient to D/C to [facility] tomorrow. Daughter Juliann Pulse is going to try to meet with Maudie Mercury admissions coordinator at [facility] today to complete paper work before she returns to [place]. Patient can sign her own paper work if needed. CSW made Amy with [facility] aware of above. CSW will continue to follow and assist as needed.   Blima Rich, [place] [phone]

## 2014-11-03 NOTE — Plan of Care (Signed)
Problem: Phase II Progression Outcomes Goal: Tolerating diet Outcome: Progressing Tolerating meals  Problem: Phase III Progression Outcomes Goal: Pain controlled on oral analgesia Outcome: Progressing Pt states pain med effective

## 2014-11-03 NOTE — Progress Notes (Signed)
Occupational Therapy Treatment Patient Details Name: Cheyenne Boone MRN: [id number] DOB: March 06, 1924 Today's Date: 11/03/2014    History of present illness Pt is an 81 y.o. female s/p fall with L hip pain.  Imaging demonstrating comminuted intertrochanteric L femoral neck fx with subtrochanteric extension.  Imaging also shows large hital hernia.  Pt s/p L IMN femoral 11/01/14.   OT comments  Patient seen for lower body dressing skills. She stated she needed to go to the bathroom. Session focused on transfer from chair to bed side commode. She required moderate assist of 2 and verbal cues for safety and technique. Patient required assistance for hygiene. Noted blood seeping from wound and nursing informed. After transfer back to chair patient had increased shortness of breath, but O2 sats were 99%. Patient daughter Cheyenne Boone arrived and stated she was anxious about going to rehab.  Follow Up Recommendations  SNF    Equipment Recommendations   (reacher, sock aid and LH shoe horn)    Recommendations for Other Services      Precautions / Restrictions Precautions Precautions: Fall Restrictions Weight Bearing Restrictions: Yes LLE Weight Bearing: Weight bearing as tolerated       Mobility Bed Mobility      Transfers          Balance                                   ADL                                              Vision                     Perception     Praxis      Cognition   Behavior During Therapy: WFL for tasks assessed/performed Overall Cognitive Status: Within Functional Limits for tasks assessed                       Extremity/Trunk Assessment  Upper Extremity Assessment Upper Extremity Assessment: Overall WFL for tasks assessed   Lower Extremity Assessment Lower Extremity Assessment: Defer to PT evaluation        Exercises     Shoulder Instructions       General Comments      Pertinent  Vitals/ Pain       Pain Assessment: 0-10 Pain Score: 2  Pain Location: left hip Pain Descriptors / Indicators: Aching;Sore Pain Intervention(s): Monitored during session  Home Living Family/patient expects to be discharged to:: Skilled nursing facility Living Arrangements: Alone   Type of Home: House Home Access: Stairs to enter [employer] of Steps: 3 Entrance Stairs-Rails: Right Home Layout: One level               Home Equipment: Walker - 4 wheels;Cane - single point;Bedside commode;Hand held shower head;Shower seat          Prior Functioning/Environment Level of Independence: Independent        Comments: Pt independent without AD in home but uses walking stick in community.   Frequency       Progress Toward Goals  OT Goals(current goals can now be found in the care plan section)  Progress towards OT goals: Progressing toward goals  Acute Rehab OT Goals Patient Stated  Goal: To have less pain OT Goal Formulation: With patient/family Time For Goal Achievement: 11/17/14 Potential to Achieve Goals: Good ADL Goals Pt Will Perform Lower Body Bathing: with min assist;with adaptive equipment;sit to/from stand (while seated in chair ) Pt Will Perform Lower Body Dressing: with adaptive equipment;sit to/from stand;with min assist Pt Will Transfer to Toilet: with mod assist;stand pivot transfer;bedside commode (BSC over toilet) Pt Will Perform Toileting - Clothing Manipulation and hygiene: with min assist;with adaptive equipment;sit to/from stand Pt Will Perform Tub/Shower Transfer: with min assist;shower seat;Squat pivot transfer  Plan      Co-evaluation                 End of Session Equipment Utilized During Treatment: Gait belt   Activity Tolerance Patient limited by fatigue   Patient Left in chair;with call bell/phone within reach   Nurse Communication Other (comment) (Reported urine out put and O2 and shortness of breath.)         Time: 1350-1415 OT Time Calculation (min): 25 min  Charges: OT General Charges $OT Visit: 1 Procedure OT Evaluation $Initial OT Evaluation Tier I: 1 Procedure OT Treatments $Self Care/Home Management : 23-37 mins  Wofford,Susan 11/03/2014, 2:26 PM    Susanne BordersSusan Wofford, OTR/L

## 2014-11-03 NOTE — Evaluation (Signed)
Occupational Therapy Evaluation Patient Details Name: Cheyenne Boone MRN: [id number] DOB: 1924/04/25 Today's Date: 11/03/2014    History of Present Illness Pt is an 81 y.o. female s/p fall with L hip pain.  Imaging demonstrating comminuted intertrochanteric L femoral neck fx with subtrochanteric extension.  Imaging also shows large hital hernia.  Pt s/p L IMN femoral 11/01/14.   Clinical Impression   Pt is 81 year old female s/p L hip ORIF after falling and sustaining a hip fracture.  She lives at home alone and was very active doing housework and cutting the grass. Pt was independent in all ADLs prior to surgery and is eager to return to PLOF.  Pt is currently limited in functional ADLs due to pain and decreased ROM .  Pt requires moderate assist and max cues for LB dressing and bathing skills due to pain and decreased AROM of L LE  and would benefit from continued skilled OT services for education in assistive devices, functional mobility, and education in recommendations for home modifications to increase safety and prevent falls. She had a company come out a few years ago and made adaptations to her bathroom toilet and shower installing grab bars and a walk in shower with seat.  Pt is a good candidate for SNF to continue rehabilitation.         Follow Up Recommendations  SNF    Equipment Recommendations   (reacher, sock aid and LH shoe horn)    Recommendations for Other Services       Precautions / Restrictions Precautions Precautions: Fall Restrictions Weight Bearing Restrictions: Yes LLE Weight Bearing: Weight bearing as tolerated      Mobility Bed Mobility                  Transfers                      Balance                                            ADL                                         General ADL Comments: Pt required a lot of encouragement to get out of bed to chair but once in chair she was  cooperative but cautious about moving LLE.  She requires moderate assist and max cues to use reacher and sock aid and is HOH.  She is able to complete grooming skills and UB dressing and bathing Independently after set up.      Vision     Perception     Praxis      Pertinent Vitals/Pain Pain Assessment: 0-10 Pain Score: 4  Pain Location: L hip Pain Descriptors / Indicators: Sharp;Sore Pain Intervention(s): Limited activity within patient's tolerance;Monitored during session;Premedicated before session     Hand Dominance     Extremity/Trunk Assessment Upper Extremity Assessment Upper Extremity Assessment: Overall WFL for tasks assessed   Lower Extremity Assessment Lower Extremity Assessment: Defer to PT evaluation       Communication Communication Communication: HOH   Cognition Arousal/Alertness: Awake/alert Behavior During Therapy: WFL for tasks assessed/performed Overall Cognitive Status: Within Functional Limits for tasks assessed  General Comments       Exercises       Shoulder Instructions      Home Living Family/patient expects to be discharged to:: Skilled nursing facility Living Arrangements: Alone   Type of Home: House Home Access: Stairs to enter Entrance Stairs-Number of Steps: 3 Entrance Stairs-Rails: Right Home Layout: One level               Home Equipment: Walker - 4 wheels;Cane - single point;Bedside commode;Hand held shower head;Shower seat          Prior Functioning/Environment Level of Independence: Independent        Comments: Pt independent without AD in home but uses walking stick in community.    OT Diagnosis: Generalized weakness;Acute pain   OT Problem List: Decreased strength;Decreased range of motion;Decreased activity tolerance;Decreased knowledge of use of DME or AE;Pain   OT Treatment/Interventions:      OT Goals(Current goals can be found in the care plan section) Acute Rehab OT  Goals Patient Stated Goal: To have less pain OT Goal Formulation: With patient/family Time For Goal Achievement: 11/17/14 Potential to Achieve Goals: Good  OT Frequency:     Barriers to D/C:            Co-evaluation              End of Session Equipment Utilized During Treatment:  (reacher and sock aid)  Activity Tolerance: Patient limited by pain Patient left: in chair;with call bell/phone within reach;with chair alarm set;with family/visitor present   Time: [id number] OT Time Calculation (min): 28 min Charges:  OT General Charges $OT Visit: 1 Procedure OT Evaluation $Initial OT Evaluation Tier I: 1 Procedure OT Treatments $Self Care/Home Management : 8-22 mins G-Codes:    Cheyenne Boone 11/03/2014, 12:56 PM   Cheyenne BordersSusan Einar Boone, OTR/L ascom [phone]

## 2014-11-03 NOTE — Progress Notes (Signed)
Physical Therapy Treatment Patient Details Name: Cheyenne Boone MRN: [id number] DOB: 1923/11/17 Today's Date: 11/03/2014    History of Present Illness Pt is an 81 y.o. female s/p fall with L hip pain.  Imaging demonstrating comminuted intertrochanteric L femoral neck fx with subtrochanteric extension.  Imaging also shows large hital hernia.  Pt s/p L IMN femoral 11/01/14.    PT Comments    Pt with decreased ambulation distance this afternoon (20 feet with RW with 2 assist) and pt with increased sweating and SOB with activity and feeling weak limiting activity; upon returning to bed pt c/o feeling like she would pass out.  Pt assisted back to bed and nursing notified and came to assess pt (nursing reporting no recent pain medications and planning to get pt pain medication); vital sign BP 154/71, HR 72 bpm, and O2 94% on 1 L/min via nasal cannula.  Will continue to progress pt per pt tolerance.  Continue to recommend STR.  Follow Up Recommendations  SNF     Equipment Recommendations  Rolling walker with 5" wheels    Recommendations for Other Services       Precautions / Restrictions Precautions Precautions: Fall Restrictions Weight Bearing Restrictions: Yes LLE Weight Bearing: Weight bearing as tolerated    Mobility  Bed Mobility Overal bed mobility: Needs Assistance Bed Mobility: Sit to Supine     Sit to supine: Mod assist;+2 for physical assistance   General bed mobility comments: assist for trunk and B LE's  Transfers Overall transfer level: Needs assistance Equipment used: Rolling walker (2 wheeled) Transfers: Sit to/from [employer] Transfers Sit to Stand: Min assist;Mod assist;+2 physical assistance Stand pivot transfers: Min assist;Mod assist (transfer chair to commode)       General transfer comment: vc's required for hand and feet placement  Ambulation/Gait Ambulation/Gait assistance: Min assist;Mod assist;+2 physical assistance;+2  safety/equipment Ambulation Distance (Feet): 20 Feet Assistive device: Rolling walker (2 wheeled)       General Gait Details: antalgic gait; decreased stance time L LE; intermittent assist to advance L LE (pt able to advance L LE on her own intermittently with decreased foot clearance and heelstrike); limited d/t SOB and fatigue and L hip pain   Stairs            Wheelchair Mobility    Modified Rankin (Stroke Patients Only)       Balance                                    Cognition Arousal/Alertness: Awake/alert Behavior During Therapy: WFL for tasks assessed/performed Overall Cognitive Status: Within Functional Limits for tasks assessed                      Exercises      General Comments  Pt agreeable to PT session and requesting to use commode.      Pertinent Vitals/Pain Pain Assessment: 0-10 Pain Score: 9  Pain Location: L hip Pain Descriptors / Indicators: Aching;Sharp;Shooting;Sore Pain Intervention(s): Limited activity within patient's tolerance;Monitored during session;Repositioned;Patient requesting pain meds-RN notified    Home Living Family/patient expects to be discharged to:: Skilled nursing facility Living Arrangements: Alone   Type of Home: House Home Access: Stairs to enter Entrance Stairs-Rails: Right Home Layout: One level Home Equipment: Walker - 4 wheels;Cane - single point;Bedside commode;Hand held shower head;Shower seat      Prior Function Level of Independence: Independent  Comments: Pt independent without AD in home but uses walking stick in community.   PT Goals (current goals can now be found in the care plan section) Acute Rehab PT Goals Patient Stated Goal: To have less pain PT Goal Formulation: With patient Time For Goal Achievement: 11/16/14 Potential to Achieve Goals: Good Progress towards PT goals: Progressing toward goals    Frequency  BID    PT Plan Current plan remains appropriate     Co-evaluation             End of Session Equipment Utilized During Treatment: Gait belt;Oxygen Activity Tolerance: Patient limited by pain;Patient limited by fatigue Patient left: in bed;with call bell/phone within reach;with bed alarm set;with nursing/sitter in room     Time: [id number] PT Time Calculation (min) (ACUTE ONLY): 38 min  Charges:  $Gait Training: 8-22 mins $Therapeutic Exercise: 8-22 mins $Therapeutic Activity: 8-22 mins                    G CodesHendricks Boone:      Cheyenne Boone 11/03/2014, 3:39 PM Cheyenne LimesEmily Jaclyn Boone, PT [phone]

## 2014-11-03 NOTE — Progress Notes (Signed)
Physical Therapy Treatment Patient Details Name: Cheyenne Boone MRN: [id number] DOB: 06/10/1924 Today's Date: 11/03/2014    History of Present Illness Pt is an 81 y.o. female s/p fall with L hip pain.  Imaging demonstrating comminuted intertrochanteric L femoral neck fx with subtrochanteric extension.  Imaging also shows large hital hernia.  Pt s/p L IMN femoral 11/01/14.    PT Comments    Pt's pain appearing to be improved today compared to yesterday and pt able to progress to 30 feet ambulation with RW this morning and participate in LE bed ex's.  Will continue to progress pt per pt tolerance.  Follow Up Recommendations  SNF     Equipment Recommendations  Rolling walker with 5" wheels    Recommendations for Other Services       Precautions / Restrictions Precautions Precautions: Fall Restrictions Weight Bearing Restrictions: Yes LLE Weight Bearing: Weight bearing as tolerated    Mobility  Bed Mobility Overal bed mobility: Needs Assistance Bed Mobility: Supine to Sit     Supine to sit: Mod assist;HOB elevated     General bed mobility comments: assist for trunk and L LE  Transfers Overall transfer level: Needs assistance Equipment used: Rolling walker (2 wheeled) Transfers: Sit to/from [employer]Stand;Stand Pivot Transfers Sit to Stand: Min assist;+2 physical assistance Stand pivot transfers: Min guard;Min assist;+2 physical assistance       General transfer comment: vc's required for hand and feet placement  Ambulation/Gait Ambulation/Gait assistance: Min assist;Min guard;+2 physical assistance Ambulation Distance (Feet): 30 Feet Assistive device: Rolling walker (2 wheeled)       General Gait Details: antalgic gait; decreased stance time L LE; initial assist to advance L LE then pt able to advance L LE on her own with decreased foot clearance and heelstrike   Stairs            Wheelchair Mobility    Modified Rankin (Stroke Patients Only)        Balance                                    Cognition Arousal/Alertness: Awake/alert Behavior During Therapy: WFL for tasks assessed/performed Overall Cognitive Status: Within Functional Limits for tasks assessed                      Exercises   Performed x10 B LE semi-supine therapeutic exercise:  Quad sets with 3 second holds (AROM R; AROM L); hip Adduction isometrics x3 seconds (pillow between thighs) (AROM B LE's); ankle pumps (AROM B LE's); gluteal sets x3 second holds (AROM B); heelslides (AROM R; AAROM L); SAQ's (AROM R; AAROM L); hip abduction/adduction (AROM R; AAROM L).  Pt required vc's and tactile cues for correct technique with ex's.    General Comments  Pt agreeable to PT session.      Pertinent Vitals/Pain Pain Assessment: 0-10 Pain Score: 6  (0/10 at rest) Pain Location: L hip Pain Descriptors / Indicators: Sharp;Shooting;Sore Pain Intervention(s): Limited activity within patient's tolerance;Monitored during session;Premedicated before session;Repositioned  Vitals stable and WFL throughout treatment session (on 1 L/min O2 via nasal cannula).     Home Living Family/patient expects to be discharged to:: Skilled nursing facility Living Arrangements: Alone   Type of Home: House Home Access: Stairs to enter Entrance Stairs-Rails: Right Home Layout: One level Home Equipment: Walker - 4 wheels;Cane - single point;Bedside commode;Hand held shower head;Shower seat  Prior Function Level of Independence: Independent      Comments: Pt independent without AD in home but uses walking stick in community.   PT Goals (current goals can now be found in the care plan section) Acute Rehab PT Goals Patient Stated Goal: To have less pain PT Goal Formulation: With patient Time For Goal Achievement: 11/16/14 Potential to Achieve Goals: Good Progress towards PT goals: Progressing toward goals    Frequency  BID    PT Plan Current plan remains  appropriate    Co-evaluation             End of Session Equipment Utilized During Treatment: Gait belt Activity Tolerance: Patient limited by pain Patient left: in chair;with call bell/phone within reach;with chair alarm set;with family/visitor present     Time: 1610-9604 PT Time Calculation (min) (ACUTE ONLY): 36 min  Charges:  $Gait Training: 8-22 mins $Therapeutic Exercise: 8-22 mins                    G CodesHendricks Limes 2014/11/12, 1:42 PM Hendricks Limes, PT [phone]

## 2014-11-03 NOTE — Progress Notes (Signed)
Pt has not had any complaints of pain thus far this shift. PT working with pt. Noticed small amount of bright red blood to dressing at incision site. Pt has no pain. Pt void post foley. Up to chair with PT. Pt became short of breath on 1L, increased 0xygen to 2L, will continue to monitor. Pt tolerating meals well. Able to move toes and feel sensation to affected extremity. Skin warm and dry. Pulses palpable. Will continue to assess and monitor for safety.

## 2014-11-04 ENCOUNTER — Encounter: Payer: Self-pay | Admitting: Orthopedic Surgery

## 2014-11-04 ENCOUNTER — Inpatient Hospital Stay: Payer: Commercial Managed Care - HMO

## 2014-11-04 LAB — CBC WITH DIFFERENTIAL/PLATELET
BASOS PCT: 0 %
Basophils Absolute: 0 10*3/uL (ref 0–0.1)
EOS PCT: 0 %
Eosinophils Absolute: 0 10*3/uL (ref 0–0.7)
HCT: 25 % — ABNORMAL LOW (ref 35.0–47.0)
HEMOGLOBIN: 8.3 g/dL — AB (ref 12.0–16.0)
LYMPHS PCT: 8 %
Lymphs Abs: 1 10*3/uL (ref 1.0–3.6)
MCH: 31.4 pg (ref 26.0–34.0)
MCHC: 33.1 g/dL (ref 32.0–36.0)
MCV: 94.9 fL (ref 80.0–100.0)
Monocytes Absolute: 0.9 10*3/uL (ref 0.2–0.9)
Monocytes Relative: 7 %
NEUTROS ABS: 11.2 10*3/uL — AB (ref 1.4–6.5)
Neutrophils Relative %: 85 %
Platelets: 201 10*3/uL (ref 150–440)
RBC: 2.63 MIL/uL — AB (ref 3.80–5.20)
RDW: 13.6 % (ref 11.5–14.5)
WBC: 13.1 10*3/uL — ABNORMAL HIGH (ref 3.6–11.0)

## 2014-11-04 LAB — BLOOD GAS, ARTERIAL
Acid-Base Excess: 3.3 mmol/L — ABNORMAL HIGH (ref 0.0–3.0)
Bicarbonate: 26.3 mEq/L (ref 21.0–28.0)
FIO2: 0.32 %
O2 Saturation: 93.6 %
PCO2 ART: 33 mmHg (ref 32.0–48.0)
PO2 ART: 62 mmHg — AB (ref 83.0–108.0)
Patient temperature: 37
pH, Arterial: 7.51 — ABNORMAL HIGH (ref 7.350–7.450)

## 2014-11-04 LAB — BASIC METABOLIC PANEL
Anion gap: 3 — ABNORMAL LOW (ref 5–15)
BUN: 29 mg/dL — ABNORMAL HIGH (ref 6–20)
CHLORIDE: 111 mmol/L (ref 101–111)
CO2: 26 mmol/L (ref 22–32)
Calcium: 8.3 mg/dL — ABNORMAL LOW (ref 8.9–10.3)
Creatinine, Ser: 1.07 mg/dL — ABNORMAL HIGH (ref 0.44–1.00)
GFR calc non Af Amer: 45 mL/min — ABNORMAL LOW (ref >60)
GFR, EST AFRICAN AMERICAN: 52 mL/min — AB (ref >60)
GLUCOSE: 129 mg/dL — AB (ref 65–99)
Potassium: 3.9 mmol/L (ref 3.5–5.1)
SODIUM: 140 mmol/L (ref 135–145)

## 2014-11-04 LAB — GLUCOSE, CAPILLARY: Glucose-Capillary: 131 mg/dL — ABNORMAL HIGH (ref 65–99)

## 2014-11-04 MED ORDER — HYDROCODONE-ACETAMINOPHEN 5-325 MG PO TABS
1.0000 | ORAL_TABLET | Freq: Four times a day (QID) | ORAL | Status: DC | PRN
Start: 2014-11-04 — End: 2015-04-07

## 2014-11-04 MED ORDER — ENOXAPARIN SODIUM 30 MG/0.3ML ~~LOC~~ SOLN: 30.0000 mg | SUBCUTANEOUS | Status: DC

## 2014-11-04 NOTE — Progress Notes (Signed)
   Subjective: 3 Days Post-Op Procedure(s) (LRB): INTRAMEDULLARY (IM) NAIL FEMORAL (Left) Patient reports pain as mild.  Pt sleeping Patient is well, and has had no acute complaints or problems. Nurses note confusion last night. We will start therapy today.  Plan is to go Skilled nursing facility after hospital stay.  Objective: Vital signs in last 24 hours: Temp:  [98 F (36.7 C)-98.9 F (37.2 C)] 98.9 F (37.2 C) (05/25 0741) Pulse Rate:  [61-108] 108 (05/25 0741) Resp:  [18] 18 (05/25 0741) BP: (117-169)/(52-88) 169/80 mmHg (05/25 0741) SpO2:  [90 %-100 %] 90 % (05/25 0741)  Intake/Output from previous day: 05/24 0701 - 05/25 0700 In: 720 [P.O.:240] Out: 50 [Urine:50] Intake/Output this shift:     Recent Labs  11/02/14 0355 11/03/14 0626 11/04/14 0511  HGB 9.3* 8.2* 8.3*    Recent Labs  11/03/14 0626 11/04/14 0511  WBC 13.6* 13.1*  RBC 2.61* 2.63*  HCT 24.8* 25.0*  PLT 153 201    Recent Labs  11/02/14 0355 11/04/14 0511  NA 133* 140  K 3.9 3.9  CL 105 111  CO2 24 26  BUN 20 29*  CREATININE 1.18* 1.07*  GLUCOSE 200* 129*  CALCIUM 7.5* 8.3*   No results for input(s): LABPT, INR in the last 72 hours.  EXAM General - Patient is Sleeping Extremity - Neurologically intact Neurovascular intact Sensation intact distally Intact pulses distally No cellulitis present Dressing - dressing C/D/I and scant drainage Motor Function - intact, moving foot and toes well on exam.   Past Medical History  Diagnosis Date  . Hypertension   . Hypercholesteremia     Assessment/Plan:   3 Days Post-Op Procedure(s) (LRB): INTRAMEDULLARY (IM) NAIL FEMORAL (Left) Active Problems:   Hip fracture  Estimated body mass index is 27.86 kg/(m^2) as calculated from the following:   Height as of this encounter: 4\' 11"  (1.499 m).   Weight as of this encounter: 62.596 kg (138 lb). Advance diet Up with therapy  Discharge to SNF today if cleared by medicine Follow up  with [facility] in 2 weeks  DVT Prophylaxis - Lovenox Weight-Bearing as tolerated to left leg D/C O2 and Pulse OX and try on Room Air  T. Cranston Neighborhris Destry Bezdek, PA-C [facility] 11/04/2014, 7:44 AM

## 2014-11-04 NOTE — Progress Notes (Signed)
Pt became unresponsive this am. Md notified. Rapid response called at 0853. Pt not responding to painful stimuli. CT of head ordered and completed. Pt VSS, pt continued to be unresponsive. Pt transferred to ICU . [facility].

## 2014-11-04 NOTE — Progress Notes (Signed)
PT Cancellation Note  Patient Details Name: Cheyenne Boone MRN: [id number] DOB: 01/24/1924   Cancelled Treatment:    Reason Eval/Treat Not Completed: Medical issues which prohibited therapy (Pt with rapid response this AM and transferring to ICU stepdown).  Will hold PT at this time and will initiate treatment (pending continuation of PT orders upon transfer to ICU) when medically appropriate.   Irving Burtonmily Angelyn Osterberg 11/04/2014, 9:42 AM Hendricks LimesEmily Chyrel Taha, PT [phone]

## 2014-11-04 NOTE — Progress Notes (Signed)
PT Cancellation Note  Patient Details Name: Heath GoldKatherine M Maffett MRN: [id number] DOB: 02/26/1924   Cancelled Treatment:    Reason Eval/Treat Not Completed: Medical issues which prohibited therapy (Pt with rapid response this AM and transferred to ICU).  Discussed pt with pt's ICU nurse and that PT would require new orders to continue.  Nursing reporting she would contact MD for new order.  Will hold PT until new (or continue upon transfer to ICU) order received.    Irving Burtonmily Marifer Hurd 11/04/2014, 2:44 PM Hendricks LimesEmily Jakory Matsuo, PT [phone]

## 2014-11-04 NOTE — Progress Notes (Signed)
Spoke with Dr. Marcello FennelHande via telephone and order received to transfer patient to ICU stepdown

## 2014-11-04 NOTE — Progress Notes (Signed)
OT Cancellation Note  Patient Details Name: Cheyenne GoldKatherine M Boone MRN: [id number] DOB: 10/20/1923   Cancelled Treatment:      Medical issues which prohibited therapy (Pt with rapid response this AM and transferring to ICU stepdown). Will hold OT at this time and will initiate treatment (pending continuation of OT orders upon transfer to ICU) when medically appropriate.  Wofford,Susan    Susanne BordersSusan Wofford, OTR/L ascom [phone] 11/04/2014, 1:06 PM

## 2014-11-04 NOTE — Progress Notes (Signed)
Pt  noted to be unresponsive this am. CT head, Chronic Microvascular changes but no acute abnormality. Transferred to Step down. Now more alert, but confused  Check u a and culture Blood culture today Continue to monitor  Discussed with family

## 2014-11-04 NOTE — Care Management Note (Signed)
Case Management Note  Patient Details  Name: Heath GoldKatherine M Hunke MRN: [id number] Date of Birth: 04/01/1924  Subjective/Objective:    Rapid response called this am. Per nursing, pt became acutely unresponsive. Would not respond to any form of  stimuli by staff. Plan is head CT to rule out acute stroke. VSS. Possible transfer to CCU                Action/Plan:   Expected Discharge Date:                  Expected Discharge Plan:  Skilled Nursing Facility  In-House Referral:  Clinical Social Work  Discharge planning Services     Post Acute Care Choice:    Choice offered to:     DME Arranged:    DME Agency:     HH Arranged:    HH Agency:     Status of Service:    Medicare Important Message Given:  Yes Date Medicare IM Given:  11/02/14 Medicare IM give by:  Gweneth DimitriLisa Brallan Denio Date Additional Medicare IM Given:    Additional Medicare Important Message give by:     If discussed at Long Length of Stay Meetings, dates discussed:    Additional Comments:  Marily MemosLisa M Lonni Dirden, RN 11/04/2014, 9:31 AM

## 2014-11-04 NOTE — Progress Notes (Signed)
PROGRESS NOTE  Cheyenne Boone YNW:[id number] DOB: 06/10/1924 DOA: 10/31/2014 PCP: No primary care provider on file.   81 y/o f with hx of HTN, Hyperlipidemia, osteoporosis, previous CVA admitted with left Hip fracture following a mechanical fall. Pt underwent ORIF 11/01/14 This am drowsy      Objective: BP 169/80 mmHg  Pulse 108  Temp(Src) 98.9 F (37.2 C) (Oral)  Resp 18  Ht 4\' 11"  (1.499 m)  Wt 62.596 kg (138 lb)  BMI 27.86 kg/m2  SpO2 90%  Intake/Output Summary (Last 24 hours) at 11/04/14 0818 Last data filed at 11/03/14 1400  Gross per 24 hour  Intake    720 ml  Output     50 ml  Net    670 ml   Filed Weights   10/31/14 0903 10/31/14 1533 11/01/14 1303  Weight: 53.071 kg (117 lb) 53.071 kg (117 lb) 62.596 kg (138 lb)    Exam:   General:  Not in distress  Cardiovascular: S1 S2  Respiratory: Clear to auscultation  Abdomen: Soft. Non tender  Neuro:No Focal  Musculoskeltal; S/p Left hip ORIF  Data Reviewed: Basic Metabolic Panel:  Recent Labs Lab 10/31/14 0912 11/01/14 0422 11/02/14 0355 11/04/14 0511  NA 142 137 133* 140  K 3.7 3.6 3.9 3.9  CL 105 104 105 111  CO2 29 27 24 26   GLUCOSE 105* 126* 200* 129*  BUN 22* 20 20 29*  CREATININE 1.24* 0.96 1.18* 1.07*  CALCIUM 9.2 8.5* 7.5* 8.3*   Liver Function Tests: No results for input(s): AST, ALT, ALKPHOS, BILITOT, PROT, ALBUMIN in the last 168 hours. No results for input(s): LIPASE, AMYLASE in the last 168 hours. No results for input(s): AMMONIA in the last 168 hours. CBC:  Recent Labs Lab 10/31/14 0912 11/01/14 0422 11/02/14 0355 11/03/14 0626 11/04/14 0511  WBC 4.8 8.4 8.3 13.6* 13.1*  NEUTROABS  --   --  7.5*  --  11.2*  HGB 13.6 11.1* 9.3* 8.2* 8.3*  HCT 41.0 33.0* 27.3* 24.8* 25.0*  MCV 94.3 95.1 95.6 95.2 94.9  PLT 241 191 149* 153 201   Cardiac Enzymes:    Recent Labs Lab 10/31/14 0912  TROPONINI <0.03   BNP (last 3 results) No results for input(s): BNP in the  last 8760 hours.  ProBNP (last 3 results) No results for input(s): PROBNP in the last 8760 hours.  CBG: No results for input(s): GLUCAP in the last 168 hours.  No results found for this or any previous visit (from the past 240 hour(s)).   Studies: No results found.  Scheduled Meds: . amLODipine  2.5 mg Oral BH-q7a  . aspirin EC  81 mg Oral BH-q7a  . calcium-vitamin D  1 tablet Oral Daily  . cefTRIAXone (ROCEPHIN)  IV  1 g Intravenous Q24H  . docusate sodium  100 mg Oral BID  . enoxaparin (LOVENOX) injection  30 mg Subcutaneous Q24H  . hydrochlorothiazide  12.5 mg Oral BH-q7a  . latanoprost  1 drop Left Eye QHS  . methylPREDNISolone (SOLU-MEDROL) injection  80 mg Intravenous Q12H  . simvastatin  20 mg Oral QHS    Continuous Infusions: . sodium chloride 75 mL/hr at 11/03/14 1810    Assessment/Plan: 1Left proximal Intertrochanteric fracture with Sub trochnatric extension; s/p ORIF Doing well post -op Continue pain management  On lovenox  Continue Physical Therapy 2 HTN: Stable-On Amlodipine. 3 Hyperlipidemia;  On Simvastatin 4 Elevated Sugar; Continue to monitor 5 Post -op anemia/ Low Platelets; Continue to monitor  6 For Rehab today   Code Status: Full        Cheyenne Boone   11/04/2014, 8:18 AM  LOS: 4 days

## 2014-11-04 NOTE — Consult Note (Signed)
Paged for rapid response. Patient unresponsive to pain applied to right finger using a pen rubbed on the nail. Patient on O2 at 3l. O2 sat being monitored. Patient on off unit telemetry. HOB elevated to 45 degrees, head in midline alignment. Accompanied patient to CT scan. Patient had been incontinent of urine.  Per staff this was the second time she had been incontinent. On arrival back to room, patient cleaned and linen changed. At 0945, patient started to cry. Did not speak. Increased heart rate to 135. Per family, she was squeezing family members hand in response to verbal stimuli. patient briefly cried again. No verbal response. Family states patient is hard of hearing and wears a hearing aide in her right ear. Wallace CullensGray purse and glasses given to daughter Steward DroneBrenda. Steward DroneBrenda had asked about some keys and was told by another family member the keys had been given to a different family member yesterday. Hearing aide in case transport in a white gift bag with some other belongings. Flowers taken by family member. Patient transported to ICU, O2 at 3lpm during transport. Continues with off unit telemetry.

## 2014-11-04 NOTE — Discharge Instructions (Signed)

## 2014-11-04 NOTE — Progress Notes (Signed)
   11/04/14 0920  Clinical Encounter Type  Visited With Patient  Visit Type Code  Referral From Nurse  Consult/Referral To Chaplain  Spiritual Encounters  Spiritual Needs Emotional;Other (Comment)  Stress Factors  Patient Stress Factors Health changes  Chaplain was paged for a rapid response. Provided a compassionate presence. No family present. Chaplain Perlie Scheuring A. Marua Qin Ext. [phone]

## 2014-11-04 NOTE — Progress Notes (Signed)
Rapid Response was called for patient this morning. Per RN patient is going down for CT scan. [facility]. [facility] Amy. Authorization # [id number]. CSW [facility] and [facility] about [facility]. CSW will continue to follow and assist as needed.   Jetta LoutBailey Morgan, LCSWA [phone]

## 2014-11-05 MED ORDER — POLYETHYLENE GLYCOL 3350 17 G PO PACK: 17.0000 g | PACK | Freq: Every day | ORAL | Status: AC | PRN

## 2014-11-05 NOTE — Progress Notes (Signed)
RN SPOKE WITH CHRIS GAINES,PA  RE: NEED TO RESUME PHYSICAL TX AFTER TRANSFER BACK FROM CCU. MD ORDERS PHYSCIAL TX WBAT

## 2014-11-05 NOTE — Progress Notes (Signed)
[facility] per MD order. Family at the bedside. Report called to Merleen NicelyElizabeth Santos, RN at [phone].  Informed of am medications given.  Transfer paperwork sent with EMS staff. Discharge via stretcher with EMS staff.

## 2014-11-05 NOTE — Progress Notes (Signed)
Patient is medically stable for [facility]. [facility] to room [facility]B. RN will call report at [phone] and arrange EMS for transport. Clinical Child psychotherapistocial Worker (CSW) prepared D/C packet and sent D/C Summary to [employer] via carefinder. [facility] has been received. Patient's family friend Rosey Batheresa was at bedside and aware of above. Please reconsult if future social work needs arise. CSW signing off.   Jetta LoutBailey Morgan, LCSWA [phone]

## 2014-11-05 NOTE — Progress Notes (Signed)
Physical Therapy Treatment Patient Details Name: Cheyenne Boone MRN: [id number] DOB: March 11, 1924 Today's Date: 11/05/2014    History of Present Illness Pt is an 81 y.o. female s/p fall with L hip pain.  Imaging demonstrating comminuted intertrochanteric L femoral neck fx with subtrochanteric extension.  Imaging also shows large hital hernia.  Pt s/p L IMN femoral 11/01/14.  Pt transferred to CCU d/t unresponsiveness 11/04/14 and now transferred back to floor 11/05/14 and new PT order received 11/05/14.    PT Comments    Pt transferred back to floor (from CCU) this AM and pt agreeable only to in bed ex's d/t not getting any recent pain medication and anxiety medication.  Pt able to perform 20 reps of B LE semi-supine bed ex's (AROM to AAROM for L LE) and required pacing/rest breaks d/t SOB (O2 >91% on room air during session).  Will continue to progress pt per pt tolerance.  Follow Up Recommendations  SNF     Equipment Recommendations  Rolling walker with 5" wheels    Recommendations for Other Services       Precautions / Restrictions Precautions Precautions: Fall Restrictions Weight Bearing Restrictions: Yes LLE Weight Bearing: Weight bearing as tolerated    Mobility  Bed Mobility               General bed mobility comments: Pt declined OOB d/t not getting recent pain medication and anxiety medication.  Transfers                    Ambulation/Gait                 Stairs            Wheelchair Mobility    Modified Rankin (Stroke Patients Only)       Balance                                    Cognition Arousal/Alertness: Awake/alert Behavior During Therapy: WFL for tasks assessed/performed Overall Cognitive Status: Within Functional Limits for tasks assessed                      Exercises   Performed x20 B LE semi-supine therapeutic exercise:  Quad sets with 3 second holds (AROM R; AROM L); hip Adduction  isometrics x3 seconds (pillow between thighs) (AROM B LE's); ankle pumps (AROM B LE's); gluteal sets x3 second holds (AROM B); heelslides (AROM R; AAROM L); SAQ's (AROM R; AROM L); hip abduction/adduction (AROM R; AAROM L).  Pt required vc's and tactile cues for correct technique with ex's.    General Comments  Nursing cleared pt for participation in physical therapy.  Pt's daughter present for part of session.      Pertinent Vitals/Pain Pain Assessment: No/denies pain  HR 85-89 bpm during session.    Home Living                      Prior Function            PT Goals (current goals can now be found in the care plan section) Acute Rehab PT Goals Patient Stated Goal: To have less pain PT Goal Formulation: With patient Time For Goal Achievement: 11/16/14 Potential to Achieve Goals: Good Progress towards PT goals: Progressing toward goals    Frequency  BID    PT Plan Current plan remains appropriate  Co-evaluation             End of Session   Activity Tolerance: Patient tolerated treatment well (but required pacing/rest breaks d/t SOB with exertion) Patient left: in bed;with call bell/phone within reach;with bed alarm set (B heels elevated via pillow)     Time: [id number] PT Time Calculation (min) (ACUTE ONLY): 25 min  Charges:  $Therapeutic Exercise: 23-37 mins                    G CodesHendricks Boone:      Cheyenne Boone 11/05/2014, 11:41 AM Cheyenne LimesEmily Alberto Boone, PT [phone]

## 2014-11-05 NOTE — Progress Notes (Signed)
Patient arrived from CCU A+O no signs of distress

## 2014-11-05 NOTE — Clinical Social Work Placement (Signed)
   CLINICAL SOCIAL WORK PLACEMENT  NOTE  Date:  11/05/2014  Patient Details  Name: Cheyenne Boone MRN: [id number] Date of Birth: 12/29/1923  Clinical Social Work is seeking post-discharge placement for this patient at the Skilled  Nursing Facility level of care (*CSW will initial, date and re-position this form in  chart as items are completed):  Yes   Patient/family provided with [facility] Clinical Social Work Department's list of facilities offering this level of care within the geographic area requested by the patient (or if unable, by the patient's family).  Yes   Patient/family informed of their freedom to choose among providers that offer the needed level of care, that participate in Medicare, Medicaid or managed care program needed by the patient, have an available bed and are willing to accept the patient.  Yes   Patient/family informed of '[facility] interest [facility] and [facility], as well as of the fact that they are under no obligation to receive care at these facilities.  PASRR submitted to EDS on 11/02/14     PASRR number received on 11/02/14     Existing PASRR number confirmed on       FL2 transmitted to all facilities in geographic area requested by pt/family on 11/02/14     FL2 transmitted to all facilities within larger geographic area on       Patient informed that his/her managed care company has contracts with or will negotiate with certain facilities, including the following:        Yes   Patient/family informed of bed offers received.  Patient chooses bed at  [facility]     Physician recommends and patient chooses bed at      Patient [facility] on 11/05/14.  Patient to be transferred to facility by  (EMS )     Patient family notified on 11/05/14 of transfer.  Name of family member notified:   Cheyenne Boone(Teresa family friend at bedside. )     PHYSICIAN       Additional Comment:     _______________________________________________ Haig ProphetMorgan, Akanksha Bellmore G, LCSW 11/05/2014, 2:49 PM

## 2014-11-05 NOTE — Progress Notes (Signed)
Dr.Hande here to see patient.

## 2014-11-05 NOTE — Progress Notes (Signed)
DR HANDE  ON ROUNDS. MD ORDERS D/C [facility]. PLAN D/C TO SNF LATER THIS EVENING.

## 2014-11-05 NOTE — Progress Notes (Signed)
   Subjective: 4 Days Post-Op Procedure(s) (LRB): INTRAMEDULLARY (IM) NAIL FEMORAL (Left) Patient reports pain as 0/10. Alert and oriented. Patient is well, and has had no acute complaints or problems.  We will continue therapy today.  Plan is to go Skilled nursing facility after hospital stay.  Objective: Vital signs in last 24 hours: Temp:  [98 F (36.7 C)-98.1 F (36.7 C)] 98 F (36.7 C) (05/26 0645) Pulse Rate:  [61-112] 66 (05/26 0645) Resp:  [13-29] 17 (05/26 0645) BP: (115-191)/(57-102) 152/78 mmHg (05/26 0645) SpO2:  [92 %-100 %] 94 % (05/26 0645)  Intake/Output from previous day: 05/25 0701 - 05/26 0700 In: 825 [I.V.:825] Out: 1550 [Urine:1550] Intake/Output this shift:     Recent Labs  11/03/14 0626 11/04/14 0511  HGB 8.2* 8.3*    Recent Labs  11/03/14 0626 11/04/14 0511  WBC 13.6* 13.1*  RBC 2.61* 2.63*  HCT 24.8* 25.0*  PLT 153 201    Recent Labs  11/04/14 0511  NA 140  K 3.9  CL 111  CO2 26  BUN 29*  CREATININE 1.07*  GLUCOSE 129*  CALCIUM 8.3*   No results for input(s): LABPT, INR in the last 72 hours.  EXAM General - Patient is alert and oriented x 3 Extremity - Neurologically intact Neurovascular intact Sensation intact distally Intact pulses distally No cellulitis present Dressing - dressing C/D/I and scant drainage Motor Function - intact, moving foot toes and upper extremities well on exam.   Past Medical History  Diagnosis Date  . Hypertension   . Hypercholesteremia     Assessment/Plan:   4 Days Post-Op Procedure(s) (LRB): INTRAMEDULLARY (IM) NAIL FEMORAL (Left) Active Problems:   Hip fracture  Estimated body mass index is 27.86 kg/(m^2) as calculated from the following:   Height as of this encounter: 4\' 11"  (1.499 m).   Weight as of this encounter: 62.596 kg (138 lb). Advance diet Up with therapy  Discharge to SNF today if cleared by medicine Follow up with [facility] in 2 weeks  DVT Prophylaxis -  Lovenox Weight-Bearing as tolerated to left leg D/C O2 and Pulse OX and try on Room Air  T. Cranston Neighborhris Gaines, PA-C [facility] 11/05/2014, 7:46 AM

## 2014-11-05 NOTE — Progress Notes (Signed)
PROGRESS NOTE  Cheyenne GoldKatherine M Ertle ZOX:[id number] DOB: 09/30/1923 DOA: 10/31/2014 PCP: No primary care provider on file.   81 y/o f with hx of HTN, Hyperlipidemia, osteoporosis, previous CVA admitted with left Hip fracture following a mechanical fall. Pt underwent ORIF 11/01/14 Had episode of unresponsiveness yesterday. This am more awake       Objective: BP 152/78 mmHg  Pulse 66  Temp(Src) 98 F (36.7 C) (Oral)  Resp 17  Ht 4\' 11"  (1.499 m)  Wt 62.596 kg (138 lb)  BMI 27.86 kg/m2  SpO2 94%  Intake/Output Summary (Last 24 hours) at 11/05/14 0826 Last data filed at 11/05/14 0557  Gross per 24 hour  Intake    825 ml  Output   1550 ml  Net   -725 ml   Filed Weights   10/31/14 0903 10/31/14 1533 11/01/14 1303  Weight: 53.071 kg (117 lb) 53.071 kg (117 lb) 62.596 kg (138 lb)    Exam:   General:  Not in distress  Cardiovascular: S1 S2  Respiratory: Clear to auscultation  Abdomen: Soft. Non tender  Neuro:No Focal  Musculoskeltal; S/p Left hip ORIF  Data Reviewed: Basic Metabolic Panel:  Recent Labs Lab 10/31/14 0912 11/01/14 0422 11/02/14 0355 11/04/14 0511  NA 142 137 133* 140  K 3.7 3.6 3.9 3.9  CL 105 104 105 111  CO2 29 27 24 26   GLUCOSE 105* 126* 200* 129*  BUN 22* 20 20 29*  CREATININE 1.24* 0.96 1.18* 1.07*  CALCIUM 9.2 8.5* 7.5* 8.3*   Liver Function Tests: No results for input(s): AST, ALT, ALKPHOS, BILITOT, PROT, ALBUMIN in the last 168 hours. No results for input(s): LIPASE, AMYLASE in the last 168 hours. No results for input(s): AMMONIA in the last 168 hours. CBC:  Recent Labs Lab 10/31/14 0912 11/01/14 0422 11/02/14 0355 11/03/14 0626 11/04/14 0511  WBC 4.8 8.4 8.3 13.6* 13.1*  NEUTROABS  --   --  7.5*  --  11.2*  HGB 13.6 11.1* 9.3* 8.2* 8.3*  HCT 41.0 33.0* 27.3* 24.8* 25.0*  MCV 94.3 95.1 95.6 95.2 94.9  PLT 241 191 149* 153 201   Cardiac Enzymes:    Recent Labs Lab 10/31/14 0912  TROPONINI <0.03   BNP (last 3  results) No results for input(s): BNP in the last 8760 hours.  ProBNP (last 3 results) No results for input(s): PROBNP in the last 8760 hours.  CBG:  Recent Labs Lab 11/04/14 0847  GLUCAP 131*    No results found for this or any previous visit (from the past 240 hour(s)).   Studies: Ct Head Wo Contrast  11/04/2014   CLINICAL DATA:  Patient unresponsive today. Status post fixation of a left hip fracture 11/01/2014.  EXAM: CT HEAD WITHOUT CONTRAST  TECHNIQUE: Contiguous axial images were obtained from the base of the skull through the vertex without intravenous contrast.  COMPARISON:  Brain MRI 12/18/2013.  Head CT scan 12/17/2013.  FINDINGS: Cortical atrophy and chronic microvascular ischemic change are identified as on the prior studies. No evidence of acute intracranial abnormality including hemorrhage, infarct, mass lesion, mass effect or abnormal extra-axial fluid collection is identified. Chronic right mastoid effusion is unchanged. Left mastoid air cells and visualized paranasal sinuses are clear.  IMPRESSION: No acute abnormality.  Atrophy and chronic microvascular ischemic change.  Chronic right mastoid effusion.   Electronically Signed   By: Drusilla Kannerhomas  Dalessio M.D.   On: 11/04/2014 09:40    Scheduled Meds: . amLODipine  2.5 mg Oral BH-q7a  .  aspirin EC  81 mg Oral BH-q7a  . calcium-vitamin D  1 tablet Oral Daily  . cefTRIAXone (ROCEPHIN)  IV  1 g Intravenous Q24H  . docusate sodium  100 mg Oral BID  . hydrochlorothiazide  12.5 mg Oral BH-q7a  . latanoprost  1 drop Left Eye QHS  . methylPREDNISolone (SOLU-MEDROL) injection  80 mg Intravenous Q12H  . simvastatin  20 mg Oral QHS    Continuous Infusions: . sodium chloride 75 mL/hr at 11/05/14 0645    Assessment/Plan: 1Left proximal Intertrochanteric fracture with Sub trochnatric extension; s/p ORIF Doing well post -op Continue pain management  Continue Physical Therapy 2 Episode of unresponsiveness; CT Negative. Mental  status improved D/c Foley 2 HTN: Stable-On Amlodipine. 3 Hyperlipidemia;  On Simvastatin 4 Elevated Sugar; Continue to monitor 5 Post -op anemia/ Low Platelets; Continue to monitor  6 For Rehab  soon   Code Status: Full        Draylon Mercadel   11/05/2014, 8:26 AM  LOS: 5 days

## 2014-11-05 NOTE — Discharge Summary (Signed)
Physician Discharge Summary  Cheyenne Boone:[id number] DOB: 1924-02-13 DOA: 10/31/2014  PCP: Cheyenne Boone  Admit date: 10/31/2014 Discharge date: 11/05/2014  Time spent: 35 minutes  Recommendations for Outpatient Follow-up:  1. Follow up with Cheyenne Boone in 1-2 weeks 2. Follow up with Cheyenne Boone in 2 -3 weeks    Discharge Diagnoses:  Active Problems: 1  Fall with Left Hip fracture- s/p Intramedullary nail 11/01/14 2 Altered mental state- resolved  3 HTN 4 Anxiety   Discharge Condition: Stable    Filed Weights   10/31/14 0903 10/31/14 1533 11/01/14 1303  Weight: 53.071 kg (117 lb) 53.071 kg (117 lb) 62.596 kg (138 lb)    History of present illness:  Cheyenne Boone is a 81 year old female with a known history of hypertension hyperlipidemia osteoporosis history of previous CVA present in the hospital after mechanical fall was noted to have a left hip fracture. Patient basically lost her footing and fell to the floor. X-ray of the left hip showed evidence for comminuted left intertrochanteric left femur fracture with severe demineralization Patient underwent intramedullary nail placement on 11/01/2014.Following surgery she was doing well but on 11/04/2014 patient had an episode of altered mental status when she became unresponsive. She did maintain a vital signs. Patient was transferred to the CCU for monitoring CT scan of the head showed chronic microvascular changes but no acute process. Patient's mental status gradually returned back to baseline. She was also seen by physical therapy and advised to go to skilled nursing facility. Blood cultures were negative. Urine cultures were negative  Patient was stable at time of discharge She will follow up with orthopedics Cheyenne Boone  also follow up with me Cheyenne Boone in 1-2 weeks' time         Procedures: Left Hip intramedullary nail Consultations:  Cheyenne Boone  Discharge Exam: Filed Vitals:   11/05/14 1134  BP:  149/68  Pulse: 83  Temp: 97.8 F (36.6 C)  Resp: 18    General: Not in distress Cardiovascular: S1 S2 Respiratory: Clear  Discharge Instructions    Current Discharge Medication List    START taking these medications   Details  enoxaparin (LOVENOX) 30 MG/0.3ML injection Inject 0.3 mLs (30 mg total) into the skin daily. Qty: 14 Syringe, Refills: 0    HYDROcodone-acetaminophen (NORCO/VICODIN) 5-325 MG per tablet Take 1-2 tablets by mouth every 6 (six) hours as needed for moderate pain. Qty: 30 tablet, Refills: 0      CONTINUE these medications which have NOT CHANGED   Details  ALPRAZolam (XANAX) 0.25 MG tablet Take 0.25 mg by mouth 2 (two) times daily as needed. For sleep    amLODipine (NORVASC) 2.5 MG tablet Take 2.5 mg by mouth every morning.    aspirin EC 81 MG tablet Take 81 mg by mouth every morning.    Calcium Carbonate-Vitamin D (CALCIUM-D) 600-400 MG-UNIT TABS Take 1 tablet by mouth daily.    docusate sodium (COLACE) 100 MG capsule Take 100 mg by mouth daily as needed for mild constipation.    hydrochlorothiazide (MICROZIDE) 12.5 MG capsule Take 12.5 mg by mouth every morning.    latanoprost (XALATAN) 0.005 % ophthalmic solution Place 1 drop into the left eye at bedtime.    polyethylene glycol powder (GLYCOLAX/MIRALAX) powder Take 1 Container by mouth daily as needed for mild constipation or moderate constipation.    simvastatin (ZOCOR) 20 MG tablet Take 20 mg by mouth at bedtime.       No Known Allergies  Follow-up Information    Follow up with Cheyenne Boone On 11/20/2014.   Specialty:  Orthopedic Surgery   Why:  Appointment is at 9:30   Contact information:   [address] [facility] [place] [address] [phone]        The results of significant diagnostics from this hospitalization (including imaging, microbiology, ancillary and laboratory) are listed below for reference.    Significant Diagnostic Studies: Dg  Chest 1 View  10/31/2014   CLINICAL DATA:  Left intertrochanteric femoral neck fracture due to a fall and her laundry room at home earlier today. Preoperative respiratory evaluation.  EXAM: CHEST  1 VIEW  COMPARISON:  No prior chest x-ray. Visualized lung bases on CT abdomen 03/10/2013.  FINDINGS: Cardiac silhouette upper normal in size to mildly enlarged for the supine technique. Thoracic aorta atherosclerotic. Large hiatal hernia as noted previously. Hilar and mediastinal contours otherwise unremarkable. Lungs clear. Bronchovascular markings normal. Pulmonary vascularity normal. No visible pleural effusions. No pneumothorax. Old healed fractures involving right ribs. Generalized osseous demineralization.  IMPRESSION: 1. Borderline to mild cardiomegaly. No acute cardiopulmonary disease. 2. Large hiatal hernia.   Electronically Signed   By: Hulan Saas M.D.   On: 10/31/2014 09:54   Ct Head Wo Contrast  11/04/2014   CLINICAL DATA:  Patient unresponsive today. Status post fixation of a left hip fracture 11/01/2014.  EXAM: CT HEAD WITHOUT CONTRAST  TECHNIQUE: Contiguous axial images were obtained from the base of the skull through the vertex without intravenous contrast.  COMPARISON:  Brain MRI 12/18/2013.  Head CT scan 12/17/2013.  FINDINGS: Cortical atrophy and chronic microvascular ischemic change are identified as on the prior studies. No evidence of acute intracranial abnormality including hemorrhage, infarct, mass lesion, mass effect or abnormal extra-axial fluid collection is identified. Chronic right mastoid effusion is unchanged. Left mastoid air cells and visualized paranasal sinuses are clear.  IMPRESSION: No acute abnormality.  Atrophy and chronic microvascular ischemic change.  Chronic right mastoid effusion.   Electronically Signed   By: Drusilla Kanner M.D.   On: 11/04/2014 09:40   Dg C-arm 1-60 Min-no Report  11/01/2014   CLINICAL DATA: left hip fracture   C-ARM 1-60 MINUTES  Fluoroscopy  was utilized by the requesting physician.  No radiographic  interpretation.    Dg Hip Operative Unilat With Pelvis Left  11/03/2014   CLINICAL DATA:  Hip fracture ORIF.  Initial encounter.  EXAM: OPERATIVE LEFT HIP (WITH PELVIS IF PERFORMED) 3 VIEWS  TECHNIQUE: Fluoroscopic spot image(s) were submitted for interpretation post-operatively.  FLUOROSCOPY TIME:  Radiation Exposure Index (as provided by the fluoroscopic device):  If the device does not provide the exposure index:  Fluoroscopy Time:  1 min and 9 seconds  Number of Acquired Images:  3  COMPARISON:  Radiographs 10/31/2014.  FINDINGS: Three spot fluoroscopic images demonstrate intramedullary nail and dynamic femoral neck screw fixation of the comminuted intertrochanteric femoral fracture. There is a distal interlocking screw. The main fracture fragments demonstrate near anatomic alignment. No complications observed.  IMPRESSION: Near anatomic reduction of intertrochanteric fracture post ORIF.   Electronically Signed   By: Carey Bullocks M.D.   On: 11/03/2014 08:26   Dg Hip Unilat With Pelvis 2-3 Views Left  10/31/2014   CLINICAL DATA:  81 year old who fell earlier today while at home in her laundry room. Injury to the left hip with inability to move the left lower extremity. Initial encounter.  EXAM: LEFT HIP (WITH PELVIS) 2-3 VIEWS  COMPARISON:  None.  FINDINGS: Comminuted intertrochanteric left femoral neck fracture. Severe osseous demineralization. Mild joint space narrowing.  Included AP pelvis demonstrates no fractures elsewhere. Symmetric mild joint space narrowing in the contralateral right hip. Sacroiliac joints and symphysis pubis intact. Aortoiliofemoral atherosclerosis.  IMPRESSION: Comminuted intertrochanteric left femoral neck fracture. Severe osseous demineralization.   Electronically Signed   By: Hulan Saashomas  Lawrence M.D.   On: 10/31/2014 09:52    Microbiology: Recent Results (from the past 240 hour(s))  Culture, blood (routine x 2)      Status: None (Preliminary result)   Collection Time: 11/04/14  2:12 PM  Result Value Ref Range Status   Specimen Description BLOOD  Final   Special Requests NONE  Final   Culture NO GROWTH < 24 HOURS  Final   Report Status PENDING  Incomplete     Labs: Basic Metabolic Panel:  Recent Labs Lab 10/31/14 0912 11/01/14 0422 11/02/14 0355 11/04/14 0511  NA 142 137 133* 140  K 3.7 3.6 3.9 3.9  CL 105 104 105 111  CO2 29 27 24 26   GLUCOSE 105* 126* 200* 129*  BUN 22* 20 20 29*  CREATININE 1.24* 0.96 1.18* 1.07*  CALCIUM 9.2 8.5* 7.5* 8.3*   Liver Function Tests: No results for input(s): AST, ALT, ALKPHOS, BILITOT, PROT, ALBUMIN in the last 168 hours. No results for input(s): LIPASE, AMYLASE in the last 168 hours. No results for input(s): AMMONIA in the last 168 hours. CBC:  Recent Labs Lab 10/31/14 0912 11/01/14 0422 11/02/14 0355 11/03/14 0626 11/04/14 0511  WBC 4.8 8.4 8.3 13.6* 13.1*  NEUTROABS  --   --  7.5*  --  11.2*  HGB 13.6 11.1* 9.3* 8.2* 8.3*  HCT 41.0 33.0* 27.3* 24.8* 25.0*  MCV 94.3 95.1 95.6 95.2 94.9  PLT 241 191 149* 153 201   Cardiac Enzymes:  Recent Labs Lab 10/31/14 0912  TROPONINI <0.03   BNP: BNP (last 3 results) No results for input(s): BNP in the last 8760 hours.  ProBNP (last 3 results) No results for input(s): PROBNP in the last 8760 hours.  CBG:  Recent Labs Lab 11/04/14 0847  GLUCAP 131*       Signed:  Kathryne Ramella   11/05/2014, 12:59 PM

## 2014-11-06 LAB — URINE CULTURE: CULTURE: NO GROWTH

## 2014-11-09 LAB — CULTURE, BLOOD (ROUTINE X 2): CULTURE: NO GROWTH

## 2014-11-11 ENCOUNTER — Encounter
Admission: RE | Admit: 2014-11-11 | Discharge: 2014-11-11 | Disposition: A | Discharge status: Home / Self Care | Payer: Commercial Managed Care - HMO | Source: Ambulatory Visit | Attending: Internal Medicine | Admitting: Internal Medicine

## 2015-02-28 ENCOUNTER — Encounter: Payer: Self-pay | Admitting: Emergency Medicine

## 2015-02-28 ENCOUNTER — Emergency Department
Admission: EM | Admit: 2015-02-28 | Discharge: 2015-02-28 | Disposition: A | Discharge status: Home / Self Care | Payer: Commercial Managed Care - HMO | Attending: Emergency Medicine | Admitting: Emergency Medicine

## 2015-02-28 DIAGNOSIS — Z7901 Long term (current) use of anticoagulants: Secondary | ICD-10-CM | POA: Insufficient documentation

## 2015-02-28 DIAGNOSIS — J302 Other seasonal allergic rhinitis: Secondary | ICD-10-CM

## 2015-02-28 DIAGNOSIS — J301 Allergic rhinitis due to pollen: Secondary | ICD-10-CM | POA: Diagnosis not present

## 2015-02-28 DIAGNOSIS — Z7982 Long term (current) use of aspirin: Secondary | ICD-10-CM | POA: Diagnosis not present

## 2015-02-28 DIAGNOSIS — Z79899 Other long term (current) drug therapy: Secondary | ICD-10-CM | POA: Insufficient documentation

## 2015-02-28 DIAGNOSIS — I10 Essential (primary) hypertension: Secondary | ICD-10-CM | POA: Diagnosis not present

## 2015-02-28 DIAGNOSIS — J029 Acute pharyngitis, unspecified: Secondary | ICD-10-CM | POA: Diagnosis present

## 2015-02-28 MED ORDER — FLUTICASONE PROPIONATE 50 MCG/ACT NA SUSP: 2.0000 | Freq: Every day | NASAL | Status: DC

## 2015-02-28 NOTE — Discharge Instructions (Signed)
Allergies  Allergies may happen from anything your body is sensitive to. This may be food, medicines, pollens, chemicals, and many other things. Food allergies can be severe and deadly.  HOME CARE  If you do not know what causes a reaction, keep a diary. Write down the foods you ate and the symptoms that followed. Avoid foods that cause reactions.  If you have red raised spots (hives) or a rash:  Take medicine as told by your doctor.  Use medicines for red raised spots and itching as needed.  Apply cold cloths (compresses) to the skin. Take a cool bath. Avoid hot baths or showers.  If you are severely allergic:  It is often necessary to go to the hospital after you have treated your reaction.  Wear your medical alert jewelry.  You and your family must learn how to give a allergy shot or use an allergy kit (anaphylaxis kit).  Always carry your allergy kit or shot with you. Use this medicine as told by your doctor if a severe reaction is occurring. GET HELP RIGHT AWAY IF:  You have trouble breathing or are making high-pitched whistling sounds (wheezing).  You have a tight feeling in your chest or throat.  You have a puffy (swollen) mouth.  You have red raised spots, puffiness (swelling), or itching all over your body.  You have had a severe reaction that was helped by your allergy kit or shot. The reaction can return once the medicine has worn off.  You think you are having a food allergy. Symptoms most often happen within 30 minutes of eating a food.  Your symptoms have not gone away within 2 days or are getting worse.  You have new symptoms.  You want to retest yourself with a food or drink you think causes an allergic reaction. Only do this under the care of a doctor. MAKE SURE YOU:   Understand these instructions.  Will watch your condition.  Will get help right away if you are not doing well or get worse. Document Released: 09/23/2012 Document Reviewed:  09/23/2012 [facility] Patient Information 2015 [place]. This information is not intended to replace advice given to you by your health care provider. Make sure you discuss any questions you have with your health care provider.   INCREASE FLUIDS  USE NOSE SPRAY AS DIRECTED FOLLOW UP WITH YOUR DOCTOR IF ANY CONTINUED PROBLEMS

## 2015-02-28 NOTE — ED Provider Notes (Signed)
[facility] Emergency Department Provider Note  ____________________________________________  Time seen: Approximately 11:47 AM  I have reviewed the triage vital signs and the nursing notes.   HISTORY  Chief Complaint Sore Throat   HPI Cheyenne Boone is a 81 y.o. female with complaint of getting choked on her phlegm.  Patient states this started approximately one week ago. She denies any fever. She denies any difficulty breathing.She states it is worse in the mornings. Son is here with her and states that she had some difficulties this morning clearing her throat and sounded like she was wheezing. Patient denied any difficulty breathing that time as well. Currently she does not take any allergy medications. Patient continues take her regular medication. She denies any pain at this time.   Past Medical History  Diagnosis Date  . Hypertension   . Hypercholesteremia     Patient Active Problem List   Diagnosis Date Noted  . Hip fracture 10/31/2014    Past Surgical History  Procedure Laterality Date  . Tonsillectomy    . Femur im nail Left 11/01/2014    Procedure: INTRAMEDULLARY (IM) NAIL FEMORAL;  Surgeon: Kennedy Bucker, MD;  Location: [facility] ORS;  Service: Orthopedics;  Laterality: Left;    Current Outpatient Rx  Name  Route  Sig  Dispense  Refill  . ALPRAZolam (XANAX) 0.25 MG tablet   Oral   Take 0.25 mg by mouth 2 (two) times daily as needed. For sleep         . amLODipine (NORVASC) 2.5 MG tablet   Oral   Take 2.5 mg by mouth every morning.         Marland Kitchen aspirin EC 81 MG tablet   Oral   Take 81 mg by mouth every morning.         . Calcium Carbonate-Vitamin D (CALCIUM-D) 600-400 MG-UNIT TABS   Oral   Take 1 tablet by mouth daily.         Marland Kitchen docusate sodium (COLACE) 100 MG capsule   Oral   Take 100 mg by mouth daily as needed for mild constipation.         Marland Kitchen EXPIRED: enoxaparin (LOVENOX) 30 MG/0.3ML injection   Subcutaneous    Inject 0.3 mLs (30 mg total) into the skin daily.   14 Syringe   0   . fluticasone (FLONASE) 50 MCG/ACT nasal spray   Each Nare   Place 2 sprays into both nostrils daily.   1 g   2   . hydrochlorothiazide (MICROZIDE) 12.5 MG capsule   Oral   Take 12.5 mg by mouth every morning.         Marland Kitchen HYDROcodone-acetaminophen (NORCO/VICODIN) 5-325 MG per tablet   Oral   Take 1-2 tablets by mouth every 6 (six) hours as needed for moderate pain.   30 tablet   0   . latanoprost (XALATAN) 0.005 % ophthalmic solution   Left Eye   Place 1 drop into the left eye at bedtime.         . polyethylene glycol (MIRALAX / GLYCOLAX) packet   Oral   Take 17 g by mouth daily as needed for mild constipation or moderate constipation.   14 each   0   . simvastatin (ZOCOR) 20 MG tablet   Oral   Take 20 mg by mouth at bedtime.           Allergies Review of patient's allergies indicates no known allergies.  No family history on file.  Social History Social History  Substance Use Topics  . Smoking status: Never Smoker   . Smokeless tobacco: None  . Alcohol Use: No    Review of Systems Constitutional: No fever/chills Eyes: No visual changes. ENT: phlegm Cardiovascular: Denies chest pain. Respiratory: Denies shortness of breath. Gastrointestinal: No abdominal pain.  No nausea, no vomiting.  No diarrhea.  No constipation. Genitourinary: Negative for dysuria. Musculoskeletal: Negative for back pain. Skin: Negative for rash. Neurological: Negative for headaches, focal weakness or numbness.  10-point ROS otherwise negative.  ____________________________________________   PHYSICAL EXAM:  VITAL SIGNS: ED Triage Vitals  Enc Vitals Group     BP 02/28/15 1128 138/74 mmHg     Pulse Rate 02/28/15 1128 75     Resp 02/28/15 1128 18     Temp 02/28/15 1128 97.7 F (36.5 C)     Temp Source 02/28/15 1128 Oral     SpO2 02/28/15 1128 96 %     Weight 02/28/15 1128 115 lb (52.164 kg)      Height 02/28/15 1128  (1.422 m)     Head Cir --      Peak Flow --      Pain Score --      Pain Loc --      Pain Edu? --      Excl. in GC? --     Constitutional: Alert and oriented. Well appearing and in no acute distress. Patient is talking in complete sentences and not having any difficulty with breathing. Eyes: Conjunctivae are normal. PERRL. EOMI. Head: Atraumatic. Nose: Mild congestion/no rhinnorhea. Mouth/Throat: Mucous membranes are moist.  Oropharynx non-erythematous. Moderate posterior drainage was seen. Neck: No stridor.   Cardiovascular: Normal rate, regular rhythm. Grossly normal heart sounds.  Good peripheral circulation. Respiratory: Normal respiratory effort.  No retractions. Lungs CTAB. Gastrointestinal: Soft and nontender. No distention. Musculoskeletal: No lower extremity tenderness nor edema.  No joint effusions. Neurologic:  Normal speech and language. No gross focal neurologic deficits are appreciated. No gait instability. Skin:  Skin is warm, dry and intact. No rash noted. Psychiatric: Mood and affect are normal. Speech and behavior are normal.  ____________________________________________   LABS (all labs ordered are listed, but only abnormal results are displayed)  Labs Reviewed - No data to display  PROCEDURES  Procedure(s) performed: None  Critical Care performed: No  ____________________________________________   INITIAL IMPRESSION / ASSESSMENT AND PLAN / ED COURSE  Pertinent labs & imaging results that were available during my care of the patient were reviewed by me and considered in my medical decision making (see chart for details).  Patient was started on Flonase nasal spray to decrease nasal swelling and also help with posterior drainage. Family agrees that this is less likely to cause drowsiness than over-the-counter medication and increasing her risk of falling. She will follow up with her PCP if any continued  problems. ____________________________________________   FINAL CLINICAL IMPRESSION(S) / ED DIAGNOSES  Final diagnoses:  Seasonal allergies      Tommi Rumps, PA-C 02/28/15 1405  Darien Ramus, MD 02/28/15 [phone]

## 2015-02-28 NOTE — ED Notes (Signed)
States she feels like she is getting choked on her phlegm and food  No drooling noted in triage   This started about 1 week ago no fever

## 2015-03-09 ENCOUNTER — Emergency Department: Payer: Commercial Managed Care - HMO

## 2015-03-09 ENCOUNTER — Inpatient Hospital Stay
Admission: EM | Admit: 2015-03-09 | Discharge: 2015-03-15 | DRG: 056 | Disposition: A | Discharge status: Other Acute Inpatient Hospital | Payer: Commercial Managed Care - HMO | Attending: Internal Medicine | Admitting: Internal Medicine

## 2015-03-09 ENCOUNTER — Encounter: Payer: Self-pay | Admitting: Emergency Medicine

## 2015-03-09 DIAGNOSIS — Z79899 Other long term (current) drug therapy: Secondary | ICD-10-CM

## 2015-03-09 DIAGNOSIS — J9601 Acute respiratory failure with hypoxia: Secondary | ICD-10-CM | POA: Diagnosis not present

## 2015-03-09 DIAGNOSIS — I359 Nonrheumatic aortic valve disorder, unspecified: Secondary | ICD-10-CM | POA: Diagnosis present

## 2015-03-09 DIAGNOSIS — J690 Pneumonitis due to inhalation of food and vomit: Secondary | ICD-10-CM | POA: Diagnosis not present

## 2015-03-09 DIAGNOSIS — N281 Cyst of kidney, acquired: Secondary | ICD-10-CM | POA: Diagnosis present

## 2015-03-09 DIAGNOSIS — K760 Fatty (change of) liver, not elsewhere classified: Secondary | ICD-10-CM | POA: Diagnosis present

## 2015-03-09 DIAGNOSIS — E876 Hypokalemia: Secondary | ICD-10-CM | POA: Diagnosis present

## 2015-03-09 DIAGNOSIS — T380X5A Adverse effect of glucocorticoids and synthetic analogues, initial encounter: Secondary | ICD-10-CM | POA: Diagnosis present

## 2015-03-09 DIAGNOSIS — I10 Essential (primary) hypertension: Secondary | ICD-10-CM | POA: Diagnosis present

## 2015-03-09 DIAGNOSIS — A419 Sepsis, unspecified organism: Secondary | ICD-10-CM | POA: Diagnosis not present

## 2015-03-09 DIAGNOSIS — E7800 Pure hypercholesterolemia, unspecified: Secondary | ICD-10-CM | POA: Diagnosis present

## 2015-03-09 DIAGNOSIS — Z452 Encounter for adjustment and management of vascular access device: Secondary | ICD-10-CM

## 2015-03-09 DIAGNOSIS — J9811 Atelectasis: Secondary | ICD-10-CM | POA: Diagnosis not present

## 2015-03-09 DIAGNOSIS — F419 Anxiety disorder, unspecified: Secondary | ICD-10-CM | POA: Diagnosis present

## 2015-03-09 DIAGNOSIS — R0600 Dyspnea, unspecified: Secondary | ICD-10-CM | POA: Diagnosis not present

## 2015-03-09 DIAGNOSIS — R633 Feeding difficulties, unspecified: Secondary | ICD-10-CM

## 2015-03-09 DIAGNOSIS — T17990A Other foreign object in respiratory tract, part unspecified in causing asphyxiation, initial encounter: Secondary | ICD-10-CM | POA: Diagnosis present

## 2015-03-09 DIAGNOSIS — Z6823 Body mass index (BMI) 23.0-23.9, adult: Secondary | ICD-10-CM

## 2015-03-09 DIAGNOSIS — Z9889 Other specified postprocedural states: Secondary | ICD-10-CM | POA: Diagnosis not present

## 2015-03-09 DIAGNOSIS — G7000 Myasthenia gravis without (acute) exacerbation: Secondary | ICD-10-CM | POA: Diagnosis present

## 2015-03-09 DIAGNOSIS — Z8673 Personal history of transient ischemic attack (TIA), and cerebral infarction without residual deficits: Secondary | ICD-10-CM

## 2015-03-09 DIAGNOSIS — E46 Unspecified protein-calorie malnutrition: Secondary | ICD-10-CM | POA: Diagnosis present

## 2015-03-09 DIAGNOSIS — E860 Dehydration: Secondary | ICD-10-CM | POA: Diagnosis present

## 2015-03-09 DIAGNOSIS — K5730 Diverticulosis of large intestine without perforation or abscess without bleeding: Secondary | ICD-10-CM | POA: Diagnosis present

## 2015-03-09 DIAGNOSIS — K449 Diaphragmatic hernia without obstruction or gangrene: Secondary | ICD-10-CM | POA: Diagnosis present

## 2015-03-09 DIAGNOSIS — J9690 Respiratory failure, unspecified, unspecified whether with hypoxia or hypercapnia: Secondary | ICD-10-CM

## 2015-03-09 DIAGNOSIS — Z4659 Encounter for fitting and adjustment of other gastrointestinal appliance and device: Secondary | ICD-10-CM

## 2015-03-09 DIAGNOSIS — R4701 Aphasia: Secondary | ICD-10-CM | POA: Diagnosis present

## 2015-03-09 DIAGNOSIS — R001 Bradycardia, unspecified: Secondary | ICD-10-CM | POA: Diagnosis present

## 2015-03-09 DIAGNOSIS — H409 Unspecified glaucoma: Secondary | ICD-10-CM | POA: Diagnosis present

## 2015-03-09 DIAGNOSIS — J9600 Acute respiratory failure, unspecified whether with hypoxia or hypercapnia: Secondary | ICD-10-CM | POA: Diagnosis not present

## 2015-03-09 DIAGNOSIS — R451 Restlessness and agitation: Secondary | ICD-10-CM | POA: Diagnosis present

## 2015-03-09 DIAGNOSIS — R1310 Dysphagia, unspecified: Secondary | ICD-10-CM | POA: Diagnosis present

## 2015-03-09 DIAGNOSIS — I2510 Atherosclerotic heart disease of native coronary artery without angina pectoris: Secondary | ICD-10-CM | POA: Diagnosis present

## 2015-03-09 DIAGNOSIS — G9341 Metabolic encephalopathy: Secondary | ICD-10-CM | POA: Diagnosis not present

## 2015-03-09 DIAGNOSIS — Z7951 Long term (current) use of inhaled steroids: Secondary | ICD-10-CM | POA: Diagnosis not present

## 2015-03-09 DIAGNOSIS — Z01818 Encounter for other preprocedural examination: Secondary | ICD-10-CM | POA: Diagnosis not present

## 2015-03-09 DIAGNOSIS — Z978 Presence of other specified devices: Secondary | ICD-10-CM

## 2015-03-09 DIAGNOSIS — M419 Scoliosis, unspecified: Secondary | ICD-10-CM | POA: Diagnosis present

## 2015-03-09 DIAGNOSIS — E785 Hyperlipidemia, unspecified: Secondary | ICD-10-CM | POA: Diagnosis present

## 2015-03-09 HISTORY — DX: Cerebral infarction, unspecified: I63.9

## 2015-03-09 LAB — BASIC METABOLIC PANEL
Anion gap: 9 (ref 5–15)
BUN: 20 mg/dL (ref 6–20)
CALCIUM: 9.5 mg/dL (ref 8.9–10.3)
CO2: 29 mmol/L (ref 22–32)
CREATININE: 1.06 mg/dL — AB (ref 0.44–1.00)
Chloride: 105 mmol/L (ref 101–111)
GFR calc Af Amer: 53 mL/min — ABNORMAL LOW (ref >60)
GFR, EST NON AFRICAN AMERICAN: 45 mL/min — AB (ref >60)
Glucose, Bld: 96 mg/dL (ref 65–99)
Potassium: 3.7 mmol/L (ref 3.5–5.1)
Sodium: 143 mmol/L (ref 135–145)

## 2015-03-09 LAB — URINALYSIS COMPLETE WITH MICROSCOPIC (ARMC ONLY)
BILIRUBIN URINE: NEGATIVE
Bacteria, UA: NONE SEEN
Glucose, UA: NEGATIVE mg/dL
Hgb urine dipstick: NEGATIVE
Leukocytes, UA: NEGATIVE
NITRITE: NEGATIVE
PH: 7 (ref 5.0–8.0)
Protein, ur: NEGATIVE mg/dL
Specific Gravity, Urine: 1.005 (ref 1.005–1.030)
Squamous Epithelial / LPF: NONE SEEN

## 2015-03-09 LAB — CBC
HCT: 43.8 % (ref 35.0–47.0)
Hemoglobin: 14.7 g/dL (ref 12.0–16.0)
MCH: 30.9 pg (ref 26.0–34.0)
MCHC: 33.6 g/dL (ref 32.0–36.0)
MCV: 91.9 fL (ref 80.0–100.0)
Platelets: 248 10*3/uL (ref 150–440)
RBC: 4.77 MIL/uL (ref 3.80–5.20)
RDW: 13.8 % (ref 11.5–14.5)
WBC: 5.5 10*3/uL (ref 3.6–11.0)

## 2015-03-09 LAB — TROPONIN I: Troponin I: <0.03 ng/mL (ref <0.031)

## 2015-03-09 MED ORDER — IOHEXOL 300 MG/ML  SOLN
75.0000 mL | Freq: Once | INTRAMUSCULAR | Status: AC | PRN
  Administered 2015-03-09: 75 mL via INTRAVENOUS

## 2015-03-09 MED ORDER — PANTOPRAZOLE SODIUM 40 MG IV SOLR
40.0000 mg | Freq: Two times a day (BID) | INTRAVENOUS | Status: DC
  Administered 2015-03-09 – 2015-03-12 (×6): 40 mg via INTRAVENOUS
  Filled 2015-03-09 (×6): qty 40

## 2015-03-09 MED ORDER — SODIUM CHLORIDE 0.9 % IV SOLN
INTRAVENOUS | Status: DC
  Administered 2015-03-09: 22:00:00 via INTRAVENOUS

## 2015-03-09 MED ORDER — ENOXAPARIN SODIUM 30 MG/0.3ML ~~LOC~~ SOLN
30.0000 mg | SUBCUTANEOUS | Status: DC
  Administered 2015-03-09: 30 mg via SUBCUTANEOUS
  Filled 2015-03-09: qty 0.3

## 2015-03-09 MED ORDER — FLUTICASONE PROPIONATE 50 MCG/ACT NA SUSP
2.0000 | Freq: Every day | NASAL | Status: DC
  Administered 2015-03-09 – 2015-03-15 (×4): 2 via NASAL
  Filled 2015-03-09 (×2): qty 16

## 2015-03-09 MED ORDER — ONDANSETRON HCL 4 MG/2ML IJ SOLN: 4.0000 mg | Freq: Four times a day (QID) | INTRAMUSCULAR | Status: DC | PRN

## 2015-03-09 MED ORDER — ALPRAZOLAM 0.25 MG PO TABS
0.2500 mg | ORAL_TABLET | Freq: Every evening | ORAL | Status: DC | PRN
  Administered 2015-03-10 – 2015-03-12 (×2): 0.25 mg via ORAL
  Filled 2015-03-09 (×3): qty 1

## 2015-03-09 MED ORDER — ONDANSETRON HCL 4 MG PO TABS: 4.0000 mg | ORAL_TABLET | Freq: Four times a day (QID) | ORAL | Status: DC | PRN

## 2015-03-09 MED ORDER — SODIUM CHLORIDE 0.9 % IV SOLN
INTRAVENOUS | Status: DC
  Administered 2015-03-09 – 2015-03-14 (×3): via INTRAVENOUS

## 2015-03-09 MED ORDER — ASPIRIN 300 MG RE SUPP
300.0000 mg | Freq: Once | RECTAL | Status: AC
  Administered 2015-03-09: 300 mg via RECTAL
  Filled 2015-03-09: qty 1

## 2015-03-09 NOTE — Consult Note (Signed)
GI Inpatient Consult Note  Reason for Consult: Dysphagia   Attending Requesting Consult:  Dr. Luberta Mutter  History of Present Illness: Cheyenne Boone is a 81 y.o. female was seen in our office earlier this morning and after evaluation was sent to Emergency Department.  Her daughter was with her and provided the majority of the history.  Both daughter and mother are  fair to poor historians  Daughter reports that her mother has been unable to swallow solids and liquids for the past 2 weeks.  For the past week she has been unable to swallow any pills either.  She reports when she tries to take little sips she ends up throwing it back up.  She reports she has never had anything like this before.    Daughter reports that about a month ago she noticed her mother having difficulty speaking knows her left eye was starting to droop.  She said this went on for a couple of days and then started to improve. She did not seek medical care at that time.  Patient reports weight loss along with fatigue.  She had a left hip repair in May, 2016.  She has a history of TIA approximately 15 years ago, no lingering affects.   Past Medical History:  Past Medical History  Diagnosis Date  . Hypertension   . Hypercholesteremia     Problem List: Patient Active Problem List   Diagnosis Date Noted  . Dysphagia 03/09/2015  . Hip fracture 10/31/2014    Past Surgical History: Past Surgical History  Procedure Laterality Date  . Tonsillectomy    . Femur im nail Left 11/01/2014    Procedure: INTRAMEDULLARY (IM) NAIL FEMORAL;  Surgeon: Kennedy Bucker, MD;  Location: [facility] ORS;  Service: Orthopedics;  Laterality: Left;    Allergies: No Known Allergies  Home Medications: Prescriptions prior to admission  Medication Sig Dispense Refill Last Dose  . alendronate (FOSAMAX) 70 MG tablet Take 70 mg by mouth once a week. Take with a full glass of water on an empty stomach.   unknown at unknown  . ALPRAZolam (XANAX)  0.25 MG tablet Take 0.25 mg by mouth 2 (two) times daily as needed for anxiety or sleep.   PRN at PRN  . amLODipine (NORVASC) 2.5 MG tablet Take 2.5 mg by mouth daily.   Past Week at Unknown time  . aspirin EC 81 MG tablet Take 81 mg by mouth daily.   Past Week at Unknown time  . Calcium Carbonate-Vitamin D (CALCIUM 600+D) 600-400 MG-UNIT tablet Take 1 tablet by mouth daily.   Past Week at Unknown time  . docusate sodium (COLACE) 100 MG capsule Take 100 mg by mouth daily as needed for mild constipation.   PRN at PRN  . fluticasone (FLONASE) 50 MCG/ACT nasal spray Place 2 sprays into both nostrils daily. 1 g 2 Past Week at Unknown time  . hydrochlorothiazide (MICROZIDE) 12.5 MG capsule Take 12.5 mg by mouth daily.   Past Week at Unknown time  . HYDROcodone-acetaminophen (NORCO/VICODIN) 5-325 MG per tablet Take 1-2 tablets by mouth every 6 (six) hours as needed for moderate pain. 30 tablet 0 PRN at PRN  . latanoprost (XALATAN) 0.005 % ophthalmic solution Place 1 drop into the left eye at bedtime.   Past Week at Unknown time  . polyethylene glycol (MIRALAX / GLYCOLAX) packet Take 17 g by mouth daily as needed for mild constipation or moderate constipation. 14 each 0 PRN at PRN  . simvastatin (ZOCOR) 20 MG  tablet Take 20 mg by mouth at bedtime.   Past Week at Unknown time   Home medication reconciliation was completed with the patient.   Scheduled Inpatient Medications:   . enoxaparin (LOVENOX) injection  30 mg Subcutaneous Q24H  . fluticasone  2 spray Each Nare Daily    Continuous Inpatient Infusions:   . sodium chloride      PRN Inpatient Medications:  ondansetron **OR** ondansetron (ZOFRAN) IV  Family History: family history is not on file.    Social History:   reports that she has never smoked. She does not have any smokeless tobacco history on file. She reports that she does not drink alcohol or use illicit drugs.   Review of Systems: Constitutional:  Patient reports weight loss,  no documented weight loss available  Eyes: patient reports last couple days  Has not used her eye drops, feels vision has become blurry ENT: No oral lesions, sore throat.  GI: see HPI.  Heme/Lymph: No easy bruising.  CV: No chest pain.  GU: No hematuria.  Integumentary: No rashes.  Neuro: No headaches.  Psych: No depression/anxiety.  Endocrine: No heat/cold intolerance.  Allergic/Immunologic: No urticaria.  Resp: No cough, SOB.  Musculoskeletal: No joint swelling.    Physical Examination: BP 170/72 mmHg  Pulse 69  Temp(Src) 98 F (36.7 C) (Oral)  Resp 17  Ht  (1.499 m)  Wt 48.081 kg (106 lb)  BMI 21.40 kg/m2  SpO2 95% Gen: NAD, alert and oriented x 4.  Patient's daughter provided majority of history, both are poor to fair historians. HEENT: PEERLA, EOMI, Neck: supple, no JVD or thyromegaly Chest: CTA bilaterally, no wheezes, crackles, or other adventitious sounds CV: RRR, no m/g/c/r Abd: soft, NT, ND, +BS in all four quadrants; no HSM, guarding, ridigity, or rebound tenderness Ext: no edema, well perfused with 2+ pulses, Skin: no rash or lesions noted Lymph: no LAD  Data: Lab Results  Component Value Date   WBC 5.5 03/09/2015   HGB 14.7 03/09/2015   HCT 43.8 03/09/2015   MCV 91.9 03/09/2015   PLT 248 03/09/2015    Recent Labs Lab 03/09/15 1035  HGB 14.7   Lab Results  Component Value Date   NA 143 03/09/2015   K 3.7 03/09/2015   CL 105 03/09/2015   CO2 29 03/09/2015   BUN 20 03/09/2015   CREATININE 1.06* 03/09/2015   Lab Results  Component Value Date   ALT 19 03/10/2013   AST 31 03/10/2013   ALKPHOS 144* 03/10/2013   BILITOT 0.7 03/10/2013   No results for input(s): APTT, INR, PTT in the last 168 hours.   Imaging: CLINICAL DATA: Dysphagia.  EXAM: CT NECK WITH CONTRAST  TECHNIQUE: Multidetector CT imaging of the neck was performed using the standard protocol following the bolus administration of intravenous contrast.  CONTRAST:  75mL OMNIPAQUE IOHEXOL 300 MG/ML SOLN  COMPARISON: None.  FINDINGS: Pharynx and larynx: No definite abnormality seen.  Salivary glands: Parotid and submandibular glands appear normal.  Thyroid: Normal.  Lymph nodes: No significant adenopathy is noted.  Vascular: Moderate calcified plaque is seen involving the proximal right internal carotid artery.  Limited intracranial: No abnormality seen.  Visualized orbits: Normal.  Mastoids and visualized paranasal sinuses: Fluid is noted in right mastoid air cells. Mucous retention cyst seen in right maxillary sinus.  Skeleton: Multilevel degenerative disc disease is noted in the cervical spine.  Upper chest: Visualized lungs appear normal. Atherosclerosis of thoracic aorta is noted without dissection.  IMPRESSION: Moderate  calcified plaque is noted in proximal right internal carotid artery; carotid ultrasound is recommended for further evaluation.  Fluid is noted in the right mastoid air cells. Clinical correlation is recommended to rule out mastoiditis.  No other significant abnormality seen in the soft tissues of the neck.   Electronically Signed  By: Lupita Raider, M.D.  On: 03/09/2015 13:19  CLINICAL DATA: The patient reports being unable swallow for the past week with sensation of something being stuck in the throat. The patient is quite weak and unable to stand; there is a history of previous CVA.  EXAM: ESOPHOGRAM/BARIUM SWALLOW  TECHNIQUE: Single contrast examination was performed using thin barium.  FLUOROSCOPY TIME: Fluoroscopy Time: 2 minutes, 46 seconds  Number of Acquired Images: 3  COMPARISON: Chest x-ray of Oct 31, 2014  FINDINGS: The patient was un willing or unable to swallowed the barium preparation in a meaningful fashion. There was no laryngeal penetration of the barium. Small amounts did reach the mid and distal esophagus but 1 cannot assess the status of  the esophagus on this study. There is abnormal increased density in the AP window and left infrahilar regions that appears new but is not well evaluated on today's fluoroscopic examination.  IMPRESSION: 1. Nondiagnostic barium swallow examination due to the patient's inability to more than tiny amounts of the barium. CT scanning of the neck and chest would be a useful next imaging step. 2. Possible mass in the AP window. Possible infrahilar pneumonia on the left. 3. The extremely limited nature of the study was discussed by me by telephone with Dr. Daryel November in the [facility] emergency department.   Electronically Signed  By: David Swaziland M.D.  On: 03/09/2015 11:48  CLINICAL DATA: Pt c/o not being able to swallow good x 1 week. Pt reports only being able to swallow small amt at a time without vomiting. Hx of CVA, no hx of CA or surg.  EXAM: CT CHEST WITH CONTRAST  TECHNIQUE: Multidetector CT imaging of the chest was performed during intravenous contrast administration.  CONTRAST: 75mL OMNIPAQUE IOHEXOL 300 MG/ML SOLN  COMPARISON: Chest x-ray 03/09/2015  FINDINGS: Heart: There is significant coronary artery calcification. Mitral annulus and aortic valve calcification is noted.  Vascular structures: There is dense atherosclerotic calcification of the thoracic aorta. Aorta is tortuous but not aneurysmal. No evidence for dissection.  Mediastinum/thyroid: Large hiatal hernia. The visualized portion of the thyroid gland has a normal appearance. No mediastinal, hilar, or axillary adenopathy. Contrast identified within the distal esophagus and proximal stomach.  Lungs/Airways: No pulmonary nodules, pleural effusions, or infiltrates.  Upper abdomen: Significant colonic diverticular disease. Calcifications are identified within the liver. There is focal fatty infiltration adjacent to the gallbladder fossa region liver. Small renal cysts are present.  There is dense atherosclerotic calcification of the abdominal aorta.  Chest wall/osseous structures: There wedge compression fractures of T8, T 11, and L1. No suspicious lytic or blastic lesions are identified. Scoliosis.  IMPRESSION: 1. Significant coronary artery disease, mitral annulus and aortic valve disease. 2. Tortuous aorta without aneurysm. 3. Large hiatal hernia. No associated obstruction. 4. No significant adenopathy. 5. Significant colonic diverticulosis. 6. Focal fatty infiltration of the liver. 7. Small renal cysts. 8. Hepatic granulomata. 9. Thoracic and lumbar wedge compression fractures associated with scoliosis.   Electronically Signed  By: Norva Pavlov M.D.  On: 03/09/2015 13:19  Assessment/Plan: Ms. Hoffmann is a 81 y.o. female  With sudden onset dysphagia in the past 2 weeks.  Unable to tolerate solids or  liquids  Recommendations: We will proceed with an upper endoscopy tomorrow afternoon for further evaluation. Please see Dr. Marva Panda note for further recommendations. Thank you for the consult. Please call with questions or concerns.  Carney Harder, PA-C  I personally performed these services.

## 2015-03-09 NOTE — ED Notes (Signed)
MD at bedside. 

## 2015-03-09 NOTE — Consult Note (Signed)
Subjective: Patient seen for dysphagia. Patient seen and examined, chart reviewed. please see full GI consult by Ms. Richards.  Objective: Vital signs in last 24 hours: Temp:  [97.4 F (36.3 C)-98 F (36.7 C)] 97.4 F (36.3 C) (09/27 1727) Pulse Rate:  [58-80] 73 (09/27 1727) Resp:  [17-18] 18 (09/27 1727) BP: (128-183)/(64-94) 171/94 mmHg (09/27 1823) SpO2:  [95 %-100 %] 97 % (09/27 1727) Weight:  [48.081 kg (106 lb)] 48.081 kg (106 lb) (09/27 1008) Blood pressure 171/94, pulse 73, temperature 97.4 F (36.3 C), temperature source Oral, resp. rate 18, height  (1.499 m), weight 48.081 kg (106 lb), SpO2 97 %.   Intake/Output from previous day:    Intake/Output this shift: Total I/O In: 0  Out: 1 [Urine:1]   General appearance:  81 year old female no acute distress Resp:  Coarse rhonchi and her mitten Cardio:  Regular rate and rhythm GI:  Off nontender nondistended bowel sounds are positive Extremities:     Lab Results: Results for orders placed or performed during the hospital encounter of 03/09/15 (from the past 24 hour(s))  Basic metabolic panel     Status: Abnormal   Collection Time: 03/09/15 10:35 AM  Result Value Ref Range   Sodium 143 135 - 145 mmol/L   Potassium 3.7 3.5 - 5.1 mmol/L   Chloride 105 101 - 111 mmol/L   CO2 29 22 - 32 mmol/L   Glucose, Bld 96 65 - 99 mg/dL   BUN 20 6 - 20 mg/dL   Creatinine, Ser 1.61 (H) 0.44 - 1.00 mg/dL   Calcium 9.5 8.9 - 09.6 mg/dL   GFR calc non Af Amer 45 (L) >60 mL/min   GFR calc Af Amer 53 (L) >60 mL/min   Anion gap 9 5 - 15  CBC     Status: None   Collection Time: 03/09/15 10:35 AM  Result Value Ref Range   WBC 5.5 3.6 - 11.0 K/uL   RBC 4.77 3.80 - 5.20 MIL/uL   Hemoglobin 14.7 12.0 - 16.0 g/dL   HCT 04.5 40.9 - 81.1 %   MCV 91.9 80.0 - 100.0 fL   MCH 30.9 26.0 - 34.0 pg   MCHC 33.6 32.0 - 36.0 g/dL   RDW 91.4 78.2 - 95.6 %   Platelets 248 150 - 440 K/uL  Troponin I     Status: None   Collection Time:  03/09/15 10:35 AM  Result Value Ref Range   Troponin I <0.03 <0.031 ng/mL  Urinalysis complete, with microscopic ([facility] only)     Status: Abnormal   Collection Time: 03/09/15 12:10 PM  Result Value Ref Range   Color, Urine STRAW (A) YELLOW   APPearance CLEAR (A) CLEAR   Glucose, UA NEGATIVE NEGATIVE mg/dL   Bilirubin Urine NEGATIVE NEGATIVE   Ketones, ur TRACE (A) NEGATIVE mg/dL   Specific Gravity, Urine 1.005 1.005 - 1.030   Hgb urine dipstick NEGATIVE NEGATIVE   pH 7.0 5.0 - 8.0   Protein, ur NEGATIVE NEGATIVE mg/dL   Nitrite NEGATIVE NEGATIVE   Leukocytes, UA NEGATIVE NEGATIVE   RBC / HPF 0-5 0 - 5 RBC/hpf   WBC, UA 0-5 0 - 5 WBC/hpf   Bacteria, UA NONE SEEN NONE SEEN   Squamous Epithelial / LPF NONE SEEN NONE SEEN   Mucous PRESENT       Recent Labs  03/09/15 1035  WBC 5.5  HGB 14.7  HCT 43.8  PLT 248   BMET  Recent Labs  03/09/15 1035  NA 143  K 3.7  CL 105  CO2 29  GLUCOSE 96  BUN 20  CREATININE 1.06*  CALCIUM 9.5   LFT No results for input(s): PROT, ALBUMIN, AST, ALT, ALKPHOS, BILITOT, BILIDIR, IBILI in the last 72 hours. PT/INR No results for input(s): LABPROT, INR in the last 72 hours. Hepatitis Panel No results for input(s): HEPBSAG, HCVAB, HEPAIGM, HEPBIGM in the last 72 hours. C-Diff No results for input(s): CDIFFTOX in the last 72 hours. No results for input(s): CDIFFPCR in the last 72 hours.   Studies/Results: Ct Head Wo Contrast  03/09/2015   CLINICAL DATA:  81 year old female with headache, weakness, decreased p.o., possible dehydration. Initial encounter.  EXAM: CT HEAD WITHOUT CONTRAST  TECHNIQUE: Contiguous axial images were obtained from the base of the skull through the vertex without intravenous contrast.  COMPARISON:  11/04/2014, and earlier  FINDINGS: Chronic right mastoid effusion appears stable. Mild paranasal sinus mucosal thickening and small maxillary retention cysts appear stable. Osteopenia. No acute osseous abnormality  identified. No acute orbit or scalp soft tissue findings. Negative visualized noncontrast deep soft tissue spaces of the face.  Extensive Calcified atherosclerosis at the skull base. Stable cerebral volume. No ventriculomegaly. Chronic right anterior corona radiata lacunar infarct is stable. Small focus of cortical encephalomalacia in the posterior left temporal/ lateral occipital lobe appears stable (series 4, image 18). No midline shift, mass effect, or evidence of intracranial mass lesion. No acute intracranial hemorrhage identified. No cortically based acute infarct identified. No suspicious intracranial vascular hyperdensity.  IMPRESSION: Stable noncontrast CT appearance of the brain since May. No acute intracranial abnormality.   Electronically Signed   By: Odessa Fleming M.D.   On: 03/09/2015 11:22   Ct Soft Tissue Neck W Contrast  03/09/2015   CLINICAL DATA:  Dysphagia.  EXAM: CT NECK WITH CONTRAST  TECHNIQUE: Multidetector CT imaging of the neck was performed using the standard protocol following the bolus administration of intravenous contrast.  CONTRAST:  75mL OMNIPAQUE IOHEXOL 300 MG/ML  SOLN  COMPARISON:  None.  FINDINGS: Pharynx and larynx: No definite abnormality seen.  Salivary glands: Parotid and submandibular glands appear normal.  Thyroid: Normal.  Lymph nodes: No significant adenopathy is noted.  Vascular: Moderate calcified plaque is seen involving the proximal right internal carotid artery.  Limited intracranial: No abnormality seen.  Visualized orbits: Normal.  Mastoids and visualized paranasal sinuses: Fluid is noted in right mastoid air cells. Mucous retention cyst seen in right maxillary sinus.  Skeleton: Multilevel degenerative disc disease is noted in the cervical spine.  Upper chest: Visualized lungs appear normal. Atherosclerosis of thoracic aorta is noted without dissection.  IMPRESSION: Moderate calcified plaque is noted in proximal right internal carotid artery; carotid ultrasound is  recommended for further evaluation.  Fluid is noted in the right mastoid air cells. Clinical correlation is recommended to rule out mastoiditis.  No other significant abnormality seen in the soft tissues of the neck.   Electronically Signed   By: Lupita Raider, M.D.   On: 03/09/2015 13:19   Ct Chest W Contrast  03/09/2015   CLINICAL DATA:  Pt c/o not being able to swallow good x 1 week. Pt reports only being able to swallow small amt at a time without vomiting. Hx of CVA, no hx of CA or surg.  EXAM: CT CHEST WITH CONTRAST  TECHNIQUE: Multidetector CT imaging of the chest was performed during intravenous contrast administration.  CONTRAST:  75mL OMNIPAQUE IOHEXOL 300 MG/ML  SOLN  COMPARISON:  Chest  x-ray 03/09/2015  FINDINGS: Heart: There is significant coronary artery calcification. Mitral annulus and aortic valve calcification is noted.  Vascular structures: There is dense atherosclerotic calcification of the thoracic aorta. Aorta is tortuous but not aneurysmal. No evidence for dissection.  Mediastinum/thyroid: Large hiatal hernia. The visualized portion of the thyroid gland has a normal appearance. No mediastinal, hilar, or axillary adenopathy. Contrast identified within the distal esophagus and proximal stomach.  Lungs/Airways: No pulmonary nodules, pleural effusions, or infiltrates.  Upper abdomen: Significant colonic diverticular disease. Calcifications are identified within the liver. There is focal fatty infiltration adjacent to the gallbladder fossa region liver. Small renal cysts are present. There is dense atherosclerotic calcification of the abdominal aorta.  Chest wall/osseous structures: There wedge compression fractures of T8, T 11, and L1. No suspicious lytic or blastic lesions are identified. Scoliosis.  IMPRESSION: 1. Significant coronary artery disease, mitral annulus and aortic valve disease. 2. Tortuous aorta without aneurysm. 3. Large hiatal hernia.  No associated obstruction. 4. No  significant adenopathy. 5. Significant colonic diverticulosis. 6. Focal fatty infiltration of the liver. 7. Small renal cysts. 8. Hepatic granulomata. 9. Thoracic and lumbar wedge compression fractures associated with scoliosis.   Electronically Signed   By: Norva Pavlov M.D.   On: 03/09/2015 13:19   Dg Esophagus  03/09/2015   CLINICAL DATA:  The patient reports being unable swallow for the past week with sensation of something being stuck in the throat. The patient is quite weak and unable to stand; there is a history of previous CVA.  EXAM: ESOPHOGRAM/BARIUM SWALLOW  TECHNIQUE: Single contrast examination was performed using  thin barium.  FLUOROSCOPY TIME:  Fluoroscopy Time:  2 minutes, 46 seconds  Number of Acquired Images:  3  COMPARISON:  Chest x-ray of Oct 31, 2014  FINDINGS: The patient was un willing or unable to swallowed the barium preparation in a meaningful fashion. There was no laryngeal penetration of the barium. Small amounts did reach the mid and distal esophagus but 1 cannot assess the status of the esophagus on this study. There is abnormal increased density in the AP window and left infrahilar regions that appears new but is not well evaluated on today's fluoroscopic examination.  IMPRESSION: 1. Nondiagnostic barium swallow examination due to the patient's inability to more than tiny amounts of the barium. CT scanning of the neck and chest would be a useful next imaging step. 2. Possible mass in the AP window. Possible infrahilar pneumonia on the left. 3. The extremely limited nature of the study was discussed by me by telephone with Dr. Daryel November in the [facility] emergency department.   Electronically Signed   By: David  [place] M.D.   On: 03/09/2015 11:48    Scheduled Inpatient Medications:   . enoxaparin (LOVENOX) injection  30 mg Subcutaneous Q24H  . fluticasone  2 spray Each Nare Daily  . pantoprazole (PROTONIX) IV  40 mg Intravenous Q12H    Continuous Inpatient  Infusions:   . sodium chloride 40 mL/hr at 03/09/15 1815  . sodium chloride 20 mL/hr at 03/09/15 2139    PRN Inpatient Medications:  ALPRAZolam, ondansetron **OR** ondansetron (ZOFRAN) IV  Miscellaneous:   Assessment:  Dysphagia, atypical. Barium swallow done today nondiagnostic. Possible mass noted with possible infrahilar pneumonia. CT of the chest showing a tortuous aorta without aneurysm but no mass noted. Large hiatal hernia.  Plan:  1. Awaiting result of evaluation for stroke. We'll plan EGD for after that evaluation is completed. We'll tentatively set up EGD  for tomorrow afternoon.  Christena Deem MD 03/09/2015, 10:11 PM

## 2015-03-09 NOTE — ED Notes (Signed)
Patient transported to CT 

## 2015-03-09 NOTE — H&P (Signed)
[facility] [place] at [facility]   PATIENT NAME: Cheyenne Boone    MR#:  [id number]  DATE OF BIRTH:  1923-07-11  DATE OF ADMISSION:  03/09/2015  PRIMARY CARE PHYSICIAN: Barbette Reichmann, MD   REQUESTING/REFERRING PHYSICIAN: Daryel November  CHIEF COMPLAINT; difficulty swallowing    Chief Complaint  Patient presents with  . Dysphagia    HISTORY OF PRESENT ILLNESS:  Cheyenne Boone  is a 81 y.o. female with a known history of hypertension anxiety, glaucoma. Secondary to difficulty swallowing. Patient is having difficulty swallowing for the past 10 days. She lives alone but her daughter is there next door to take care of her. According to the patient and her daughter patient not eating well and drinking secondary to difficulty swallowing. Patient says that her particular she states gets a stuck in the throat level and then comes right back out. Denies abdominal pain. No nausea no vomiting no diarrhea, no recent illnesses. Also noticed that the patient has a trouble getting the words out. According to the daughter 2 days ago she daughter noticed that she had left I blurred vision and left eye drooping. Weakness of hands or legs. Uses walker at home. No headache.  PAST MEDICAL HISTORY:   Past Medical History  Diagnosis Date  . Hypertension   . Hypercholesteremia     PAST SURGICAL HISTOIRY:   Past Surgical History  Procedure Laterality Date  . Tonsillectomy    . Femur im nail Left 11/01/2014    Procedure: INTRAMEDULLARY (IM) NAIL FEMORAL;  Surgeon: Kennedy Bucker, MD;  Location: [facility] ORS;  Service: Orthopedics;  Laterality: Left;    SOCIAL HISTORY:   Social History  Substance Use Topics  . Smoking status: Never Smoker   . Smokeless tobacco: Not on file  . Alcohol Use: No    FAMILY HISTORY:  No family history on file.  DRUG ALLERGIES:  No Known Allergies  REVIEW OF SYSTEMS:  CONSTITUTIONAL: No fever, fatigue or weakness.  EYES: No blurred  or double vision.  EARS, NOSE, AND THROAT: No tinnitus or ear pain.  RESPIRATORY: No cough, shortness of breath, wheezing or hemoptysis.  CARDIOVASCULAR: No chest pain, orthopnea, edema.  GASTROINTESTINAL: No nausea, vomiting, diarrhea or abdominal pain.  GENITOURINARY: No dysuria, hematuria.  ENDOCRINE: No polyuria, nocturia,  HEMATOLOGY: No anemia, easy bruising or bleeding SKIN: No rash or lesion. MUSCULOSKELETAL: No joint pain or arthritis.   NEUROLOGIC: No tingling, numbness, weakness.  PSYCHIATRY: No anxiety or depression.   MEDICATIONS AT HOME:   Prior to Admission medications   Medication Sig Start Date End Date Taking? Authorizing Provider  ALPRAZolam (XANAX) 0.25 MG tablet Take 0.25 mg by mouth 2 (two) times daily as needed for anxiety or sleep.   Yes Historical Provider, MD  amLODipine (NORVASC) 2.5 MG tablet Take 2.5 mg by mouth daily.   Yes Historical Provider, MD  aspirin EC 81 MG tablet Take 81 mg by mouth daily.   Yes Historical Provider, MD  Calcium Carbonate-Vitamin D (CALCIUM 600+D) 600-400 MG-UNIT tablet Take 1 tablet by mouth daily.   Yes Historical Provider, MD  docusate sodium (COLACE) 100 MG capsule Take 100 mg by mouth daily as needed for mild constipation.   Yes Historical Provider, MD  fluticasone (FLONASE) 50 MCG/ACT nasal spray Place 2 sprays into both nostrils daily. 02/28/15 02/28/16 Yes Tommi Rumps, PA-C  hydrochlorothiazide (MICROZIDE) 12.5 MG capsule Take 12.5 mg by mouth daily.   Yes Historical Provider, MD  HYDROcodone-acetaminophen (NORCO/VICODIN) 5-325 MG per  tablet Take 1-2 tablets by mouth every 6 (six) hours as needed for moderate pain. 11/04/14  Yes Evon Slack, PA-C  latanoprost (XALATAN) 0.005 % ophthalmic solution Place 1 drop into the left eye at bedtime.   Yes Historical Provider, MD  polyethylene glycol (MIRALAX / GLYCOLAX) packet Take 17 g by mouth daily as needed for mild constipation or moderate constipation. 11/05/14  Yes Vishwanath  Hande, MD  simvastatin (ZOCOR) 20 MG tablet Take 20 mg by mouth at bedtime.   Yes Historical Provider, MD      VITAL SIGNS:  Blood pressure 131/76, pulse 74, temperature 98 F (36.7 C), temperature source Oral, resp. rate 18, height  (1.499 m), weight 48.081 kg (106 lb), SpO2 96 %.  PHYSICAL EXAMINATION:  GENERAL:  81 y.o.-year-old patient lying in the bed with no acute distress. Noticed to have some expressive aphasia. EYES: Pupils equal, round, reactive to light and accommodation. No scleral icterus. Extraocular muscles intact.  HEENT: Head atraumatic, normocephalic. Oropharynx and nasopharynx clear.  NECK:  Supple, no jugular venous distention. No thyroid enlargement, no tenderness.  LUNGS: Normal breath sounds bilaterally, no wheezing, rales,rhonchi or crepitation. No use of accessory muscles of respiration.  CARDIOVASCULAR: S1, S2 normal. No murmurs, rubs, or gallops.  ABDOMEN: Soft, nontender, nondistended. Bowel sounds present. No organomegaly or mass.  EXTREMITIES: No pedal edema, cyanosis, or clubbing.  NEUROLOGIC: Cranial nerves II through XII are intact. Muscle strength 5/5 in all extremities. Sensation intact. Gait not checked.  PSYCHIATRIC: The patient is alert and oriented x 3.  SKIN: No obvious rash, lesion, or ulcer.   LABORATORY PANEL:   CBC  Recent Labs Lab 03/09/15 1035  WBC 5.5  HGB 14.7  HCT 43.8  PLT 248   ------------------------------------------------------------------------------------------------------------------  Chemistries   Recent Labs Lab 03/09/15 1035  NA 143  K 3.7  CL 105  CO2 29  GLUCOSE 96  BUN 20  CREATININE 1.06*  CALCIUM 9.5   ------------------------------------------------------------------------------------------------------------------  Cardiac Enzymes  Recent Labs Lab 03/09/15 1035  TROPONINI <0.03    ------------------------------------------------------------------------------------------------------------------  RADIOLOGY:  Ct Head Wo Contrast  03/09/2015   CLINICAL DATA:  81 year old female with headache, weakness, decreased p.o., possible dehydration. Initial encounter.  EXAM: CT HEAD WITHOUT CONTRAST  TECHNIQUE: Contiguous axial images were obtained from the base of the skull through the vertex without intravenous contrast.  COMPARISON:  11/04/2014, and earlier  FINDINGS: Chronic right mastoid effusion appears stable. Mild paranasal sinus mucosal thickening and small maxillary retention cysts appear stable. Osteopenia. No acute osseous abnormality identified. No acute orbit or scalp soft tissue findings. Negative visualized noncontrast deep soft tissue spaces of the face.  Extensive Calcified atherosclerosis at the skull base. Stable cerebral volume. No ventriculomegaly. Chronic right anterior corona radiata lacunar infarct is stable. Small focus of cortical encephalomalacia in the posterior left temporal/ lateral occipital lobe appears stable (series 4, image 18). No midline shift, mass effect, or evidence of intracranial mass lesion. No acute intracranial hemorrhage identified. No cortically based acute infarct identified. No suspicious intracranial vascular hyperdensity.  IMPRESSION: Stable noncontrast CT appearance of the brain since May. No acute intracranial abnormality.   Electronically Signed   By: Odessa Fleming M.D.   On: 03/09/2015 11:22   Ct Soft Tissue Neck W Contrast  03/09/2015   CLINICAL DATA:  Dysphagia.  EXAM: CT NECK WITH CONTRAST  TECHNIQUE: Multidetector CT imaging of the neck was performed using the standard protocol following the bolus administration of intravenous contrast.  CONTRAST:  75mL  OMNIPAQUE IOHEXOL 300 MG/ML  SOLN  COMPARISON:  None.  FINDINGS: Pharynx and larynx: No definite abnormality seen.  Salivary glands: Parotid and submandibular glands appear normal.  Thyroid:  Normal.  Lymph nodes: No significant adenopathy is noted.  Vascular: Moderate calcified plaque is seen involving the proximal right internal carotid artery.  Limited intracranial: No abnormality seen.  Visualized orbits: Normal.  Mastoids and visualized paranasal sinuses: Fluid is noted in right mastoid air cells. Mucous retention cyst seen in right maxillary sinus.  Skeleton: Multilevel degenerative disc disease is noted in the cervical spine.  Upper chest: Visualized lungs appear normal. Atherosclerosis of thoracic aorta is noted without dissection.  IMPRESSION: Moderate calcified plaque is noted in proximal right internal carotid artery; carotid ultrasound is recommended for further evaluation.  Fluid is noted in the right mastoid air cells. Clinical correlation is recommended to rule out mastoiditis.  No other significant abnormality seen in the soft tissues of the neck.   Electronically Signed   By: Lupita Raider, M.D.   On: 03/09/2015 13:19   Ct Chest W Contrast  03/09/2015   CLINICAL DATA:  Pt c/o not being able to swallow good x 1 week. Pt reports only being able to swallow small amt at a time without vomiting. Hx of CVA, no hx of CA or surg.  EXAM: CT CHEST WITH CONTRAST  TECHNIQUE: Multidetector CT imaging of the chest was performed during intravenous contrast administration.  CONTRAST:  75mL OMNIPAQUE IOHEXOL 300 MG/ML  SOLN  COMPARISON:  Chest x-ray 03/09/2015  FINDINGS: Heart: There is significant coronary artery calcification. Mitral annulus and aortic valve calcification is noted.  Vascular structures: There is dense atherosclerotic calcification of the thoracic aorta. Aorta is tortuous but not aneurysmal. No evidence for dissection.  Mediastinum/thyroid: Large hiatal hernia. The visualized portion of the thyroid gland has a normal appearance. No mediastinal, hilar, or axillary adenopathy. Contrast identified within the distal esophagus and proximal stomach.  Lungs/Airways: No pulmonary nodules,  pleural effusions, or infiltrates.  Upper abdomen: Significant colonic diverticular disease. Calcifications are identified within the liver. There is focal fatty infiltration adjacent to the gallbladder fossa region liver. Small renal cysts are present. There is dense atherosclerotic calcification of the abdominal aorta.  Chest wall/osseous structures: There wedge compression fractures of T8, T 11, and L1. No suspicious lytic or blastic lesions are identified. Scoliosis.  IMPRESSION: 1. Significant coronary artery disease, mitral annulus and aortic valve disease. 2. Tortuous aorta without aneurysm. 3. Large hiatal hernia.  No associated obstruction. 4. No significant adenopathy. 5. Significant colonic diverticulosis. 6. Focal fatty infiltration of the liver. 7. Small renal cysts. 8. Hepatic granulomata. 9. Thoracic and lumbar wedge compression fractures associated with scoliosis.   Electronically Signed   By: Norva Pavlov M.D.   On: 03/09/2015 13:19   Dg Esophagus  03/09/2015   CLINICAL DATA:  The patient reports being unable swallow for the past week with sensation of something being stuck in the throat. The patient is quite weak and unable to stand; there is a history of previous CVA.  EXAM: ESOPHOGRAM/BARIUM SWALLOW  TECHNIQUE: Single contrast examination was performed using  thin barium.  FLUOROSCOPY TIME:  Fluoroscopy Time:  2 minutes, 46 seconds  Number of Acquired Images:  3  COMPARISON:  Chest x-ray of Oct 31, 2014  FINDINGS: The patient was un willing or unable to swallowed the barium preparation in a meaningful fashion. There was no laryngeal penetration of the barium. Small amounts did reach the mid  and distal esophagus but 1 cannot assess the status of the esophagus on this study. There is abnormal increased density in the AP window and left infrahilar regions that appears new but is not well evaluated on today's fluoroscopic examination.  IMPRESSION: 1. Nondiagnostic barium swallow examination  due to the patient's inability to more than tiny amounts of the barium. CT scanning of the neck and chest would be a useful next imaging step. 2. Possible mass in the AP window. Possible infrahilar pneumonia on the left. 3. The extremely limited nature of the study was discussed by me by telephone with Dr. Daryel November in the [facility] emergency department.   Electronically Signed   By: David  [place] M.D.   On: 03/09/2015 11:48    EKG:   Orders placed or performed during the hospital encounter of 03/09/15  . ED EKG  . ED EKG  . EKG 12-Lead  . EKG 12-Lead   KG showed normal sinus rhythm at 74 bpm, T wave inversions in lead V1 and V4.  IMPRESSION AND PLAN:   1 dysphagia likely secondary to central process like TIA ': Check MRI of the brain, carotid ultrasound, echocardiogram. Can  give rectal aspirin. #2 severe dysphagia not able to even swallow water, had MBS, CT of the soft tissues of the neck which were inconclusive. We will get a GI  consult regarding the evaluation of her dysphagia ,patient may need EGD if workup is negative for stroke. #3 anxiety patient is right now nothing by mouth secondary dysphagia, continue fluids, anxiety medications can be changed to IV Ativan if needed.   All the records are reviewed and case discussed with ED provider. Management plans discussed with the patient, family and they are in agreement.  CODE STATUS: full  TOTAL TIME TAKING CARE OF THIS PATIENT: 55  minutes.    Katha Hamming M.D on 03/09/2015 at 2:08 PM  Between 7am to 6pm - Pager - [phone]  After 6pm go to [email or web address] - password [email or web address] [facility]  [facility]  Office  [phone]  CC: Primary care physician; Barbette Reichmann, MD

## 2015-03-09 NOTE — ED Notes (Signed)
Patient transported to X-ray 

## 2015-03-09 NOTE — ED Notes (Signed)
Brought over from [facility]  Per family she is unable to eat or swallow. Possible dehydration

## 2015-03-09 NOTE — ED Notes (Signed)
Pt c/o not being able to swallow good x 1 week.  Pt reports only being able to swallow small amt at a time without vomiting.

## 2015-03-09 NOTE — Progress Notes (Signed)
Per Dr. Clent Ridges order patients home med of xanax 0.25mg  as needed for anxiety at bedtime

## 2015-03-09 NOTE — ED Provider Notes (Signed)
[facility] Emergency Department Provider Note     Time seen: ----------------------------------------- 10:18 AM on 03/09/2015 -----------------------------------------    I have reviewed the triage vital signs and the nursing notes.   HISTORY  Chief Complaint Dysphagia    HPI Cheyenne Boone is a 81 y.o. female who presents ER after not being able to swallow well for the last week. Patient reports only being able to swallow very small amounts, including even saliva. She is just sipping small amounts of protein shake, does not have any pain but states she is unable to get the liquid down. She does describe weakness, nothing makes her symptoms better. Was seen in the ER 10 days ago for sinus drainage. Past Medical History  Diagnosis Date  . Hypertension   . Hypercholesteremia     Patient Active Problem List   Diagnosis Date Noted  . Hip fracture 10/31/2014    Past Surgical History  Procedure Laterality Date  . Tonsillectomy    . Femur im nail Left 11/01/2014    Procedure: INTRAMEDULLARY (IM) NAIL FEMORAL;  Surgeon: Kennedy Bucker, MD;  Location: [facility] ORS;  Service: Orthopedics;  Laterality: Left;    Allergies Review of patient's allergies indicates no known allergies.  Social History Social History  Substance Use Topics  . Smoking status: Never Smoker   . Smokeless tobacco: None  . Alcohol Use: No    Review of Systems Constitutional: Negative for fever. Eyes: Negative for visual changes. ENT: Negative for sore throat. Positive for difficulty swallowing Cardiovascular: Negative for chest pain. Respiratory: Negative for shortness of breath. Gastrointestinal: Negative for abdominal pain, vomiting and diarrhea. Genitourinary: Negative for dysuria. Musculoskeletal: Negative for back pain. Skin: Negative for rash. Neurological: Negative for headaches, positive for weakness  10-point ROS otherwise  negative.  ____________________________________________   PHYSICAL EXAM:  VITAL SIGNS: ED Triage Vitals  Enc Vitals Group     BP 03/09/15 1008 133/66 mmHg     Pulse Rate 03/09/15 1008 64     Resp 03/09/15 1008 18     Temp 03/09/15 1008 98 F (36.7 C)     Temp Source 03/09/15 1008 Oral     SpO2 03/09/15 1008 96 %     Weight 03/09/15 1008 106 lb (48.081 kg)     Height 03/09/15 1008  (1.499 m)     Head Cir --      Peak Flow --      Pain Score --      Pain Loc --      Pain Edu? --      Excl. in GC? --     Constitutional: Alert and oriented. Well appearing and in no distress. Eyes: Conjunctivae are normal. PERRL. Normal extraocular movements. ENT   Head: Normocephalic and atraumatic.   Nose: No congestion/rhinnorhea.   Mouth/Throat: Mucous membranes are dry   Neck: No stridor. Cardiovascular: Normal rate, regular rhythm. Normal and symmetric distal pulses are present in all extremities. No murmurs, rubs, or gallops. Respiratory: Normal respiratory effort without tachypnea nor retractions. Breath sounds are clear and equal bilaterally. No wheezes/rales/rhonchi. Gastrointestinal: Soft and nontender. No distention. No abdominal bruits.  Musculoskeletal: Nontender with normal range of motion in all extremities. No joint effusions.  No lower extremity tenderness nor edema. Neurologic:  Normal speech and language. No gross focal neurologic deficits are appreciated. Speech is normal. No gait instability. Skin:  Skin is warm, dry and intact. No rash noted. Poor skin turgor Psychiatric: Mood and affect are normal.  Speech and behavior are normal. Patient exhibits appropriate insight and judgment. ____________________________________________  EKG: Interpreted by me. Normal sinus rhythm rate 74 bpm, and first degree AV block, normal QRS with, normal QT interval. Normal axis.  ____________________________________________  ED COURSE:  Pertinent labs & imaging results  that were available during my care of the patient were reviewed by me and considered in my medical decision making (see chart for details). Patient appears weak and dehydrated from difficulty swallowing. Unclear etiology for her difficulty swallowing. We'll check labs and upper GI. ____________________________________________    LABS (pertinent positives/negatives)  Labs Reviewed  BASIC METABOLIC PANEL - Abnormal; Notable for the following:    Creatinine, Ser 1.06 (*)    GFR calc non Af Amer 45 (*)    GFR calc Af Amer 53 (*)    All other components within normal limits  URINALYSIS COMPLETEWITH MICROSCOPIC ([facility] ONLY) - Abnormal; Notable for the following:    Color, Urine STRAW (*)    APPearance CLEAR (*)    Ketones, ur TRACE (*)    All other components within normal limits  CBC  TROPONIN I    RADIOLOGY Images were viewed by me  Upper GI, CT head IMPRESSION: 1. Significant coronary artery disease, mitral annulus and aortic valve disease. 2. Tortuous aorta without aneurysm. 3. Large hiatal hernia. No associated obstruction. 4. No significant adenopathy. 5. Significant colonic diverticulosis. 6. Focal fatty infiltration of the liver. 7. Small renal cysts. 8. Hepatic granulomata. 9. Thoracic and lumbar wedge compression fractures associated with scoliosis. ____________________________________________  FINAL ASSESSMENT AND PLAN  Dysphagia  Plan: Patient with labs and imaging as dictated above. Patient will need to come and have an MRI. There is no clear etiology for her dysphagia identified. She can barely tolerate anything by mouth here. Possible stress-related etiology.   Emily Filbert, MD   Emily Filbert, MD 03/09/15 1324

## 2015-03-09 NOTE — Progress Notes (Signed)
Per Dr. Luberta Mutter decrease fluids to 40ml per hour

## 2015-03-09 NOTE — Progress Notes (Signed)
Notified Dr. Luberta Mutter of elevated BP, will continue to monitor.

## 2015-03-10 ENCOUNTER — Encounter: Payer: Self-pay | Admitting: Certified Registered Nurse Anesthetist

## 2015-03-10 ENCOUNTER — Encounter: Payer: Self-pay | Admitting: *Deleted

## 2015-03-10 ENCOUNTER — Inpatient Hospital Stay: Payer: Commercial Managed Care - HMO

## 2015-03-10 ENCOUNTER — Encounter
Admission: EM | Disposition: A | Discharge status: Other Acute Inpatient Hospital | Payer: Self-pay | Source: Home / Self Care | Attending: Internal Medicine

## 2015-03-10 LAB — BASIC METABOLIC PANEL
Anion gap: 13 (ref 5–15)
BUN: 17 mg/dL (ref 6–20)
CO2: 21 mmol/L — ABNORMAL LOW (ref 22–32)
CREATININE: 0.79 mg/dL (ref 0.44–1.00)
Calcium: 9.2 mg/dL (ref 8.9–10.3)
Chloride: 108 mmol/L (ref 101–111)
GFR calc Af Amer: >60 mL/min (ref >60)
Glucose, Bld: 74 mg/dL (ref 65–99)
POTASSIUM: 4 mmol/L (ref 3.5–5.1)
SODIUM: 142 mmol/L (ref 135–145)

## 2015-03-10 LAB — PROTIME-INR
INR: 1.05
Prothrombin Time: 13.9 seconds (ref 11.4–15.0)

## 2015-03-10 LAB — TSH: TSH: 2.91 u[IU]/mL (ref 0.350–4.500)

## 2015-03-10 LAB — CBC
HEMATOCRIT: 43.1 % (ref 35.0–47.0)
Hemoglobin: 14.2 g/dL (ref 12.0–16.0)
MCH: 30.6 pg (ref 26.0–34.0)
MCHC: 33.1 g/dL (ref 32.0–36.0)
MCV: 92.6 fL (ref 80.0–100.0)
PLATELETS: 257 10*3/uL (ref 150–440)
RBC: 4.65 MIL/uL (ref 3.80–5.20)
RDW: 13.9 % (ref 11.5–14.5)
WBC: 6.1 10*3/uL (ref 3.6–11.0)

## 2015-03-10 SURGERY — ESOPHAGOGASTRODUODENOSCOPY (EGD) WITH PROPOFOL
Anesthesia: General | Laterality: Left

## 2015-03-10 SURGERY — ESOPHAGOGASTRODUODENOSCOPY (EGD) WITH PROPOFOL
Anesthesia: General

## 2015-03-10 MED ORDER — ENOXAPARIN SODIUM 40 MG/0.4ML ~~LOC~~ SOLN
40.0000 mg | SUBCUTANEOUS | Status: DC
  Administered 2015-03-10 – 2015-03-11 (×2): 40 mg via SUBCUTANEOUS
  Filled 2015-03-10 (×2): qty 0.4

## 2015-03-10 MED ORDER — PRAVASTATIN SODIUM 20 MG PO TABS
10.0000 mg | ORAL_TABLET | Freq: Every day | ORAL | Status: DC
  Administered 2015-03-12: 10 mg via ORAL
  Filled 2015-03-10 (×2): qty 1

## 2015-03-10 MED ORDER — ASPIRIN 300 MG RE SUPP
300.0000 mg | Freq: Every day | RECTAL | Status: DC
  Administered 2015-03-10 – 2015-03-15 (×4): 300 mg via RECTAL
  Filled 2015-03-10 (×5): qty 1

## 2015-03-10 MED ORDER — LORAZEPAM 2 MG/ML IJ SOLN
0.5000 mg | Freq: Once | INTRAMUSCULAR | Status: AC
  Administered 2015-03-10: 0.5 mg via INTRAVENOUS
  Filled 2015-03-10: qty 1

## 2015-03-10 MED ORDER — PYRIDOSTIGMINE BROMIDE 10 MG/2ML IV SOLN
2.0000 mg | Freq: Three times a day (TID) | INTRAVENOUS | Status: DC
  Filled 2015-03-10 (×5): qty 0.4

## 2015-03-10 NOTE — Progress Notes (Signed)
Patient did not want to sign consent form for EGD. Was able to obtain consent from daughter over the phone. Two nurses verified consent.

## 2015-03-10 NOTE — Progress Notes (Signed)
Anticoagulation monitoring  81 yo female currently ordered enoxaparin 30 mg SQ q24h for DVT ppx  Scr 1.06> 0.79, CrCl ~ 33.2 ml/min Hgb 14.2, Plt 257 (~stable)  Based on CrCl >30 ml/min (improvement in renal function), will adjust enoxaparin ro 40 mg SQ q24h.   Pharmacy will continue to monitor.   Crist Fat, PharmD, BCPS Clinical Pharmacist 03/10/2015 9:27 AM

## 2015-03-10 NOTE — Progress Notes (Signed)
Patient alert with some confusion. Patient ask for xanax, but later refused. Patient refused to sign consent after educating her (noted cognitive delay). Educated patient on use of incentive, she can use it appropriately, left at bedside. Patient pulled out right IV, administered left Outer forearm  22g. Patient tolerated well. Staff will continue to monitor and meet needs.

## 2015-03-10 NOTE — Progress Notes (Signed)
Subjective: Patient lying comfortably in bed.  She still complains of not feeling any better.  Transporter at bedside for endoscopy later today. She complains of left hip discomfort.  Patient is speaking in complete sentences.  Objective: Vital signs in last 24 hours: Temp:  [97.4 F (36.3 C)-98 F (36.7 C)] 97.5 F (36.4 C) (09/28 1213) Pulse Rate:  [58-87] 87 (09/28 1213) Resp:  [17-19] 19 (09/28 1213) BP: (128-183)/(64-94) 133/75 mmHg (09/28 1213) SpO2:  [95 %-100 %] 98 % (09/28 1213) Weight:  [48.081 kg (106 lb)] 48.081 kg (106 lb) (09/28 0922) Weight change:     Intake/Output from previous day: 09/27 0701 - 09/28 0700 In: 240 [I.V.:240] Out: 4 [Urine:4] Intake/Output this shift: Total I/O In: 161 [I.V.:161] Out: 0   GENERAL:awake and alert, no acute distress.  EYES: no pallor, no icterus, extraocular muscles are intact.  HEENT: Head atraumatic, normocephalic. Oropharynx and nasopharynx clear.  NECK: supple, no jugular venous distention. No thyroid enlargement, no tenderness.  LUNGS: clear to auscultation. No wheezing, rhonchi or crepitation. No use of accessory muscles of respiration.  CARDIOVASCULAR: regular rate and rhythm, S1, S2 normal. No tachycardia.  ABDOMEN: Soft, non-tender, normal bowel sounds, no organomegaly or mass.  EXTREMITIES: No pedal edema, cyanosis, or clubbing.  NEUROLOGIC:  No hemi facial weakness, power 5/5 in bilateral upper and lower extremities, speech is clear, no slurring.  Lab Results:  Recent Labs  03/09/15 1035 03/10/15 0611  WBC 5.5 6.1  HGB 14.7 14.2  HCT 43.8 43.1  PLT 248 257   BMET  Recent Labs  03/09/15 1035 03/10/15 0611  NA 143 142  K 3.7 4.0  CL 105 108  CO2 29 21*  GLUCOSE 96 74  BUN 20 17  CREATININE 1.06* 0.79  CALCIUM 9.5 9.2    Studies/Results: Ct Head Wo Contrast  03/09/2015   CLINICAL DATA:  81 year old female with headache, weakness, decreased p.o., possible dehydration. Initial encounter.   EXAM: CT HEAD WITHOUT CONTRAST  TECHNIQUE: Contiguous axial images were obtained from the base of the skull through the vertex without intravenous contrast.  COMPARISON:  11/04/2014, and earlier  FINDINGS: Chronic right mastoid effusion appears stable. Mild paranasal sinus mucosal thickening and small maxillary retention cysts appear stable. Osteopenia. No acute osseous abnormality identified. No acute orbit or scalp soft tissue findings. Negative visualized noncontrast deep soft tissue spaces of the face.  Extensive Calcified atherosclerosis at the skull base. Stable cerebral volume. No ventriculomegaly. Chronic right anterior corona radiata lacunar infarct is stable. Small focus of cortical encephalomalacia in the posterior left temporal/ lateral occipital lobe appears stable (series 4, image 18). No midline shift, mass effect, or evidence of intracranial mass lesion. No acute intracranial hemorrhage identified. No cortically based acute infarct identified. No suspicious intracranial vascular hyperdensity.  IMPRESSION: Stable noncontrast CT appearance of the brain since May. No acute intracranial abnormality.   Electronically Signed   By: Odessa Fleming M.D.   On: 03/09/2015 11:22   Ct Soft Tissue Neck W Contrast  03/09/2015   CLINICAL DATA:  Dysphagia.  EXAM: CT NECK WITH CONTRAST  TECHNIQUE: Multidetector CT imaging of the neck was performed using the standard protocol following the bolus administration of intravenous contrast.  CONTRAST:  75mL OMNIPAQUE IOHEXOL 300 MG/ML  SOLN  COMPARISON:  None.  FINDINGS: Pharynx and larynx: No definite abnormality seen.  Salivary glands: Parotid and submandibular glands appear normal.  Thyroid: Normal.  Lymph nodes: No significant adenopathy is noted.  Vascular: Moderate calcified plaque is seen  involving the proximal right internal carotid artery.  Limited intracranial: No abnormality seen.  Visualized orbits: Normal.  Mastoids and visualized paranasal sinuses: Fluid is noted  in right mastoid air cells. Mucous retention cyst seen in right maxillary sinus.  Skeleton: Multilevel degenerative disc disease is noted in the cervical spine.  Upper chest: Visualized lungs appear normal. Atherosclerosis of thoracic aorta is noted without dissection.  IMPRESSION: Moderate calcified plaque is noted in proximal right internal carotid artery; carotid ultrasound is recommended for further evaluation.  Fluid is noted in the right mastoid air cells. Clinical correlation is recommended to rule out mastoiditis.  No other significant abnormality seen in the soft tissues of the neck.   Electronically Signed   By: Lupita Raider, M.D.   On: 03/09/2015 13:19   Ct Chest W Contrast  03/09/2015   CLINICAL DATA:  Pt c/o not being able to swallow good x 1 week. Pt reports only being able to swallow small amt at a time without vomiting. Hx of CVA, no hx of CA or surg.  EXAM: CT CHEST WITH CONTRAST  TECHNIQUE: Multidetector CT imaging of the chest was performed during intravenous contrast administration.  CONTRAST:  75mL OMNIPAQUE IOHEXOL 300 MG/ML  SOLN  COMPARISON:  Chest x-ray 03/09/2015  FINDINGS: Heart: There is significant coronary artery calcification. Mitral annulus and aortic valve calcification is noted.  Vascular structures: There is dense atherosclerotic calcification of the thoracic aorta. Aorta is tortuous but not aneurysmal. No evidence for dissection.  Mediastinum/thyroid: Large hiatal hernia. The visualized portion of the thyroid gland has a normal appearance. No mediastinal, hilar, or axillary adenopathy. Contrast identified within the distal esophagus and proximal stomach.  Lungs/Airways: No pulmonary nodules, pleural effusions, or infiltrates.  Upper abdomen: Significant colonic diverticular disease. Calcifications are identified within the liver. There is focal fatty infiltration adjacent to the gallbladder fossa region liver. Small renal cysts are present. There is dense atherosclerotic  calcification of the abdominal aorta.  Chest wall/osseous structures: There wedge compression fractures of T8, T 11, and L1. No suspicious lytic or blastic lesions are identified. Scoliosis.  IMPRESSION: 1. Significant coronary artery disease, mitral annulus and aortic valve disease. 2. Tortuous aorta without aneurysm. 3. Large hiatal hernia.  No associated obstruction. 4. No significant adenopathy. 5. Significant colonic diverticulosis. 6. Focal fatty infiltration of the liver. 7. Small renal cysts. 8. Hepatic granulomata. 9. Thoracic and lumbar wedge compression fractures associated with scoliosis.   Electronically Signed   By: Norva Pavlov M.D.   On: 03/09/2015 13:19   Mr Brain Wo Contrast  03/10/2015   CLINICAL DATA:  81 year old female with left eye blurred vision and left eye drooping. Generalized weakness and difficulty swallowing for 2 days. Decreased p.o. intake. Altered mental status. Initial encounter.  EXAM: MRI HEAD WITHOUT CONTRAST  TECHNIQUE: Multiplanar, multiecho pulse sequences of the brain and surrounding structures were obtained without intravenous contrast.  COMPARISON:  Neck CT and head CT 03/09/2015.  Brain MRI 12/18/2013.  FINDINGS: Cerebral volume is stable since 2015. Major intracranial vascular flow voids are stable. No restricted diffusion or evidence of acute infarction.  Chronic lacunar type infarcts in both cerebellar hemispheres, an the right corona radiata. Stable mild to moderate for age T2 heterogeneity in the deep gray matter nuclei and cerebral white matter elsewhere. Probable normal sulcus anatomic variation in the right parietal lobe again noted (series 10, image 20) or less likely small arachnoid cyst.  No midline shift, mass effect, evidence of mass lesion, ventriculomegaly, or  acute intracranial hemorrhage. Cervicomedullary junction and pituitary are within normal limits. Stable right mastoid effusion. Stable paranasal sinuses since 2015. Interval right globe  postoperative changes. Other orbit and scalp soft tissues are stable. Normal bone marrow signal.  There is chronic cervical spine degeneration, with at least mild spinal stenosis at C3-C4 and C4-C5. This does not appear significantly progressed since 2015.  IMPRESSION: 1. No acute intracranial abnormality. Chronic small vessel ischemic disease is stable since 2015. 2. Chronic cervical spine degeneration with C3-C4 and C4-C5 spinal stenosis which does not appear significantly progressed since 2015.   Electronically Signed   By: Odessa Fleming M.D.   On: 03/10/2015 09:57   Dg Esophagus  03/09/2015   CLINICAL DATA:  The patient reports being unable swallow for the past week with sensation of something being stuck in the throat. The patient is quite weak and unable to stand; there is a history of previous CVA.  EXAM: ESOPHOGRAM/BARIUM SWALLOW  TECHNIQUE: Single contrast examination was performed using  thin barium.  FLUOROSCOPY TIME:  Fluoroscopy Time:  2 minutes, 46 seconds  Number of Acquired Images:  3  COMPARISON:  Chest x-ray of Oct 31, 2014  FINDINGS: The patient was un willing or unable to swallowed the barium preparation in a meaningful fashion. There was no laryngeal penetration of the barium. Small amounts did reach the mid and distal esophagus but 1 cannot assess the status of the esophagus on this study. There is abnormal increased density in the AP window and left infrahilar regions that appears new but is not well evaluated on today's fluoroscopic examination.  IMPRESSION: 1. Nondiagnostic barium swallow examination due to the patient's inability to more than tiny amounts of the barium. CT scanning of the neck and chest would be a useful next imaging step. 2. Possible mass in the AP window. Possible infrahilar pneumonia on the left. 3. The extremely limited nature of the study was discussed by me by telephone with Dr. Daryel November in the [facility] emergency department.   Electronically Signed   By: David   [place] M.D.   On: 03/09/2015 11:48    Medications:  Scheduled Meds: . enoxaparin (LOVENOX) injection  40 mg Subcutaneous Q24H  . fluticasone  2 spray Each Nare Daily  . pantoprazole (PROTONIX) IV  40 mg Intravenous Q12H   Continuous Infusions: . sodium chloride 40 mL/hr at 03/09/15 1815  . sodium chloride 20 mL/hr at 03/09/15 2139   PRN Meds:.ALPRAZolam, ondansetron **OR** ondansetron (ZOFRAN) IV   Assessment/Plan: 81 year old lady admitted for complaints of difficulty speaking and left eye drooping.  Patient was evaluated by GI earlier in the day for difficulty swallowing solids and liquids for 2 weeks. No acute intracranial abnormality on initial CT head in the emergency department.   Patient was noted to have expressive aphasia, as per H&P. She received aspirin per rectum for dysphagia.  TIA: neurological symptoms noted yesterday have resolved, consistent with TIA. No eyelid drooping. Speech is clear today. MRI was negative for acute findings.  Carotid Doppler and echo are pending. Shall continue Aspirin.  Obtain lipid profile with morning labs.  Start pravastatin.  Physical therapy.  Dysphagia of solids and liquids for 2 weeks, closely followed by GI. Plan for upper endoscopy later today.  DVT prophylaxis with Lovenox.  GI prophylaxis with Protonix.   LOS: 1 day   Cheyenne Boone 03/10/2015, 12:50 PM

## 2015-03-10 NOTE — Evaluation (Signed)
Physical Therapy Evaluation Patient Details Name: Cheyenne Boone MRN: [id number] DOB: 29-Oct-1923 Today's Date: 03/10/2015   History of Present Illness  Pt is a 81 yo female who was admitted to the hospital for difficulty swallowing and speaking, as well as blurred vision and L eye droop 2 days ago.   Clinical Impression  Pt presents with hx of HTN, hypercholesteremia, and stroke. Examination reveals that she performs all mobility at min assist with heavy cueing possibly secondary to cognitive impairments. Her current functional mobility is removed from her baseline as she previously did not need assist for such tasks. Her primary physical deficits include decreased endurance and decreased functional strength. She also has gait impairments that place her at risk for a future fall. She will benefit from skilled PT in order for her to eventually return home safely. All vitals stable during mobility assessment - pt O2 sats never fell below 90% before or after mobility.     Follow Up Recommendations SNF    Equipment Recommendations  None recommended by PT    Recommendations for Other Services       Precautions / Restrictions Precautions Precautions: Fall Restrictions Weight Bearing Restrictions: No      Mobility  Bed Mobility Overal bed mobility: Needs Assistance Bed Mobility: Supine to Sit     Supine to sit: Min assist     General bed mobility comments: Pt requires assist to get to EOB and to get back into supine. She has good use of hands to assist therapist with bed mobility   Transfers Overall transfer level: Needs assistance Equipment used: Rolling walker (2 wheeled) Transfers: Sit to/from Stand Sit to Stand: Min assist         General transfer comment: Pt requires assist to get fully into standing. Shows fair functional strength with standing and sitting. Pt is impulsive and will take hands off RW. Needs cues for proper hand placement with  transfers  Ambulation/Gait Ambulation/Gait assistance: Min assist Ambulation Distance (Feet): 50 Feet Assistive device: Rolling walker (2 wheeled) Gait Pattern/deviations: Step-through pattern;Decreased step length - right;Decreased step length - left;Narrow base of support;Shuffle Gait velocity: decreased   General Gait Details: Pt becomes fatigued after roughly 50 ft of ambulation. She tends to let the walker get a bit far out in front of her leading to high potential for fall if not corrected. Pt also ambulates with varied cadence. She occasionally needs assist to direct her RW as she ran into a couple items during ambulation  Information systems manager [employer] (Stroke Patients Only)       Balance Overall balance assessment: History of Falls                                           Pertinent Vitals/Pain Pain Assessment: No/denies pain    Home Living Family/patient expects to be discharged to:: Private residence Living Arrangements: Alone Available Help at Discharge: Family;Available PRN/intermittently (Per sister, other sister's help not reliable ) Type of Home: House Home Access: Stairs to enter Entrance Stairs-Rails: Right Entrance Stairs-Number of Steps: 3 Home Layout: One level Home Equipment: Walker - 4 wheels;Cane - single point;Bedside commode;Hand held shower head;Shower seat;Walker - 2 wheels Additional Comments: [employer]    Prior Function Level of Independence: Independent with assistive device(s)  Comments: Pt uses RW with mostly household ambulation      Hand Dominance        Extremity/Trunk Assessment   Upper Extremity Assessment: Overall WFL for tasks assessed           Lower Extremity Assessment: Overall WFL for tasks assessed         Communication   Communication: HOH  Cognition Arousal/Alertness: Awake/alert Behavior During Therapy: WFL for tasks assessed/performed Overall  Cognitive Status: Impaired/Different from baseline Area of Impairment: Orientation;Attention (Per daughter) Orientation Level: Place;Time             General Comments: Daughter states that pt cognition is far from baseline. She states pt is making stories up and hallucinating while in the hospital     General Comments General comments (skin integrity, edema, etc.): Pt hard of hearing and generally confused with certain tasks. Needs reinforcement on what tasks are being performed.     Exercises        Assessment/Plan    PT Assessment Patient needs continued PT services  PT Diagnosis Difficulty walking;Abnormality of gait   PT Problem List Decreased balance;Decreased mobility;Decreased coordination;Decreased knowledge of use of DME;Decreased safety awareness  PT Treatment Interventions DME instruction;Gait training;Stair training;Functional mobility training;Therapeutic activities;Therapeutic exercise;Balance training;Neuromuscular re-education;Cognitive remediation;Patient/family education   PT Goals (Current goals can be found in the Care Plan section) Acute Rehab PT Goals Patient Stated Goal: to return home PT Goal Formulation: With patient/family Time For Goal Achievement: 03/24/15 Potential to Achieve Goals: Good    Frequency Min 2X/week   Barriers to discharge Decreased caregiver support Does not have reliable 24 hr/day assistance currently     Co-evaluation               End of Session Equipment Utilized During Treatment: Gait belt Activity Tolerance: Patient tolerated treatment well Patient left: in bed;with call bell/phone within reach;with bed alarm set;with nursing/sitter in room;with family/visitor present;with SCD's reapplied Nurse Communication: Mobility status         Time: 1610-9604 PT Time Calculation (min) (ACUTE ONLY): 1405 min   Charges:         PT G CodesBenna Dunks 04-07-2015, 5:20 PM  Benna Dunks,  SPT. [phone]

## 2015-03-10 NOTE — Consult Note (Signed)
Reason for Consult: dysphagia Referring Physician: Dr. Peggyann Juba KAELEY VINJE is an 81 y.o. female.  HPI: seen at request of Dr. Raliegh Ip for dysphagia;  81 yo RHD F who stays by herself normally with assistance of daughter presents to [facility] due to difficulty swallowing.  Pt was set up for EGD tomorrow so Neurology was asked to evaluate.  The swallowing problems have been going on for almost two weeks but there has been new weakness in her voice as well.  Pt does note some double vision as well.  Past Medical History  Diagnosis Date  . Hypertension   . Hypercholesteremia   . Stroke     Past Surgical History  Procedure Laterality Date  . Tonsillectomy    . Femur im nail Left 11/01/2014    Procedure: INTRAMEDULLARY (IM) NAIL FEMORAL;  Surgeon: Hessie Knows, MD;  Location: [facility] ORS;  Service: Orthopedics;  Laterality: Left;    Family History  Problem Relation Age of Onset  . Arthritis Mother   . Arthritis Father     Social History:  reports that she has never smoked. She does not have any smokeless tobacco history on file. She reports that she does not drink alcohol or use illicit drugs.  Allergies: No Known Allergies  Medications: personally reviewed by me  Results for orders placed or performed during the hospital encounter of 03/09/15 (from the past 48 hour(s))  Basic metabolic panel     Status: Abnormal   Collection Time: 03/09/15 10:35 AM  Result Value Ref Range   Sodium 143 135 - 145 mmol/L   Potassium 3.7 3.5 - 5.1 mmol/L   Chloride 105 101 - 111 mmol/L   CO2 29 22 - 32 mmol/L   Glucose, Bld 96 65 - 99 mg/dL   BUN 20 6 - 20 mg/dL   Creatinine, Ser 1.06 (H) 0.44 - 1.00 mg/dL   Calcium 9.5 8.9 - 10.3 mg/dL   GFR calc non Af Amer 45 (L) >60 mL/min   GFR calc Af Amer 53 (L) >60 mL/min    Comment: (NOTE) The eGFR has been calculated using the CKD EPI equation. This calculation has not been validated in all clinical situations. eGFR's persistently <60 mL/min signify  possible Chronic Kidney Disease.    Anion gap 9 5 - 15  CBC     Status: None   Collection Time: 03/09/15 10:35 AM  Result Value Ref Range   WBC 5.5 3.6 - 11.0 K/uL   RBC 4.77 3.80 - 5.20 MIL/uL   Hemoglobin 14.7 12.0 - 16.0 g/dL   HCT 43.8 35.0 - 47.0 %   MCV 91.9 80.0 - 100.0 fL   MCH 30.9 26.0 - 34.0 pg   MCHC 33.6 32.0 - 36.0 g/dL   RDW 13.8 11.5 - 14.5 %   Platelets 248 150 - 440 K/uL  Troponin I     Status: None   Collection Time: 03/09/15 10:35 AM  Result Value Ref Range   Troponin I <0.03 <0.031 ng/mL    Comment:        NO INDICATION OF MYOCARDIAL INJURY.   Urinalysis complete, with microscopic ([facility] only)     Status: Abnormal   Collection Time: 03/09/15 12:10 PM  Result Value Ref Range   Color, Urine STRAW (A) YELLOW   APPearance CLEAR (A) CLEAR   Glucose, UA NEGATIVE NEGATIVE mg/dL   Bilirubin Urine NEGATIVE NEGATIVE   Ketones, ur TRACE (A) NEGATIVE mg/dL   Specific Gravity, Urine 1.005 1.005 -  1.030   Hgb urine dipstick NEGATIVE NEGATIVE   pH 7.0 5.0 - 8.0   Protein, ur NEGATIVE NEGATIVE mg/dL   Nitrite NEGATIVE NEGATIVE   Leukocytes, UA NEGATIVE NEGATIVE   RBC / HPF 0-5 0 - 5 RBC/hpf   WBC, UA 0-5 0 - 5 WBC/hpf   Bacteria, UA NONE SEEN NONE SEEN   Squamous Epithelial / LPF NONE SEEN NONE SEEN   Mucous PRESENT   Basic metabolic panel     Status: Abnormal   Collection Time: 03/10/15  6:11 AM  Result Value Ref Range   Sodium 142 135 - 145 mmol/L   Potassium 4.0 3.5 - 5.1 mmol/L   Chloride 108 101 - 111 mmol/L   CO2 21 (L) 22 - 32 mmol/L   Glucose, Bld 74 65 - 99 mg/dL   BUN 17 6 - 20 mg/dL   Creatinine, Ser 0.79 0.44 - 1.00 mg/dL   Calcium 9.2 8.9 - 10.3 mg/dL   GFR calc non Af Amer >60 >60 mL/min   GFR calc Af Amer >60 >60 mL/min    Comment: (NOTE) The eGFR has been calculated using the CKD EPI equation. This calculation has not been validated in all clinical situations. eGFR's persistently <60 mL/min signify possible Chronic Kidney Disease.     Anion gap 13 5 - 15  CBC     Status: None   Collection Time: 03/10/15  6:11 AM  Result Value Ref Range   WBC 6.1 3.6 - 11.0 K/uL   RBC 4.65 3.80 - 5.20 MIL/uL   Hemoglobin 14.2 12.0 - 16.0 g/dL   HCT 43.1 35.0 - 47.0 %   MCV 92.6 80.0 - 100.0 fL   MCH 30.6 26.0 - 34.0 pg   MCHC 33.1 32.0 - 36.0 g/dL   RDW 13.9 11.5 - 14.5 %   Platelets 257 150 - 440 K/uL  Protime-INR     Status: None   Collection Time: 03/10/15  2:20 PM  Result Value Ref Range   Prothrombin Time 13.9 11.4 - 15.0 seconds   INR 1.05     Ct Head Wo Contrast  03/09/2015   CLINICAL DATA:  81 year old female with headache, weakness, decreased p.o., possible dehydration. Initial encounter.  EXAM: CT HEAD WITHOUT CONTRAST  TECHNIQUE: Contiguous axial images were obtained from the base of the skull through the vertex without intravenous contrast.  COMPARISON:  11/04/2014, and earlier  FINDINGS: Chronic right mastoid effusion appears stable. Mild paranasal sinus mucosal thickening and small maxillary retention cysts appear stable. Osteopenia. No acute osseous abnormality identified. No acute orbit or scalp soft tissue findings. Negative visualized noncontrast deep soft tissue spaces of the face.  Extensive Calcified atherosclerosis at the skull base. Stable cerebral volume. No ventriculomegaly. Chronic right anterior corona radiata lacunar infarct is stable. Small focus of cortical encephalomalacia in the posterior left temporal/ lateral occipital lobe appears stable (series 4, image 18). No midline shift, mass effect, or evidence of intracranial mass lesion. No acute intracranial hemorrhage identified. No cortically based acute infarct identified. No suspicious intracranial vascular hyperdensity.  IMPRESSION: Stable noncontrast CT appearance of the brain since May. No acute intracranial abnormality.   Electronically Signed   By: Genevie Ann M.D.   On: 03/09/2015 11:22   Ct Soft Tissue Neck W Contrast  03/09/2015   CLINICAL DATA:   Dysphagia.  EXAM: CT NECK WITH CONTRAST  TECHNIQUE: Multidetector CT imaging of the neck was performed using the standard protocol following the bolus administration of intravenous contrast.  CONTRAST:  28m OMNIPAQUE IOHEXOL 300 MG/ML  SOLN  COMPARISON:  None.  FINDINGS: Pharynx and larynx: No definite abnormality seen.  Salivary glands: Parotid and submandibular glands appear normal.  Thyroid: Normal.  Lymph nodes: No significant adenopathy is noted.  Vascular: Moderate calcified plaque is seen involving the proximal right internal carotid artery.  Limited intracranial: No abnormality seen.  Visualized orbits: Normal.  Mastoids and visualized paranasal sinuses: Fluid is noted in right mastoid air cells. Mucous retention cyst seen in right maxillary sinus.  Skeleton: Multilevel degenerative disc disease is noted in the cervical spine.  Upper chest: Visualized lungs appear normal. Atherosclerosis of thoracic aorta is noted without dissection.  IMPRESSION: Moderate calcified plaque is noted in proximal right internal carotid artery; carotid ultrasound is recommended for further evaluation.  Fluid is noted in the right mastoid air cells. Clinical correlation is recommended to rule out mastoiditis.  No other significant abnormality seen in the soft tissues of the neck.   Electronically Signed   By: JMarijo Conception M.D.   On: 03/09/2015 13:19   Ct Chest W Contrast  03/09/2015   CLINICAL DATA:  Pt c/o not being able to swallow good x 1 week. Pt reports only being able to swallow small amt at a time without vomiting. Hx of CVA, no hx of CA or surg.  EXAM: CT CHEST WITH CONTRAST  TECHNIQUE: Multidetector CT imaging of the chest was performed during intravenous contrast administration.  CONTRAST:  745mOMNIPAQUE IOHEXOL 300 MG/ML  SOLN  COMPARISON:  Chest x-ray 03/09/2015  FINDINGS: Heart: There is significant coronary artery calcification. Mitral annulus and aortic valve calcification is noted.  Vascular structures:  There is dense atherosclerotic calcification of the thoracic aorta. Aorta is tortuous but not aneurysmal. No evidence for dissection.  Mediastinum/thyroid: Large hiatal hernia. The visualized portion of the thyroid gland has a normal appearance. No mediastinal, hilar, or axillary adenopathy. Contrast identified within the distal esophagus and proximal stomach.  Lungs/Airways: No pulmonary nodules, pleural effusions, or infiltrates.  Upper abdomen: Significant colonic diverticular disease. Calcifications are identified within the liver. There is focal fatty infiltration adjacent to the gallbladder fossa region liver. Small renal cysts are present. There is dense atherosclerotic calcification of the abdominal aorta.  Chest wall/osseous structures: There wedge compression fractures of T8, T 11, and L1. No suspicious lytic or blastic lesions are identified. Scoliosis.  IMPRESSION: 1. Significant coronary artery disease, mitral annulus and aortic valve disease. 2. Tortuous aorta without aneurysm. 3. Large hiatal hernia.  No associated obstruction. 4. No significant adenopathy. 5. Significant colonic diverticulosis. 6. Focal fatty infiltration of the liver. 7. Small renal cysts. 8. Hepatic granulomata. 9. Thoracic and lumbar wedge compression fractures associated with scoliosis.   Electronically Signed   By: ElNolon Nations.D.   On: 03/09/2015 13:19   Mr Brain Wo Contrast  03/10/2015   CLINICAL DATA:  [age]ear old female with left eye blurred vision and left eye drooping. Generalized weakness and difficulty swallowing for 2 days. Decreased p.o. intake. Altered mental status. Initial encounter.  EXAM: MRI HEAD WITHOUT CONTRAST  TECHNIQUE: Multiplanar, multiecho pulse sequences of the brain and surrounding structures were obtained without intravenous contrast.  COMPARISON:  Neck CT and head CT 03/09/2015.  Brain MRI 12/18/2013.  FINDINGS: Cerebral volume is stable since 2015. Major intracranial vascular flow voids are  stable. No restricted diffusion or evidence of acute infarction.  Chronic lacunar type infarcts in both cerebellar hemispheres, an the right corona radiata. Stable mild to moderate  for age T2 heterogeneity in the deep gray matter nuclei and cerebral white matter elsewhere. Probable normal sulcus anatomic variation in the right parietal lobe again noted (series 10, image 20) or less likely small arachnoid cyst.  No midline shift, mass effect, evidence of mass lesion, ventriculomegaly, or acute intracranial hemorrhage. Cervicomedullary junction and pituitary are within normal limits. Stable right mastoid effusion. Stable paranasal sinuses since 2015. Interval right globe postoperative changes. Other orbit and scalp soft tissues are stable. Normal bone marrow signal.  There is chronic cervical spine degeneration, with at least mild spinal stenosis at C3-C4 and C4-C5. This does not appear significantly progressed since 2015.  IMPRESSION: 1. No acute intracranial abnormality. Chronic small vessel ischemic disease is stable since 2015. 2. Chronic cervical spine degeneration with C3-C4 and C4-C5 spinal stenosis which does not appear significantly progressed since 2015.   Electronically Signed   By: Genevie Ann M.D.   On: 03/10/2015 09:57   Dg Esophagus  03/09/2015   CLINICAL DATA:  The patient reports being unable swallow for the past week with sensation of something being stuck in the throat. The patient is quite weak and unable to stand; there is a history of previous CVA.  EXAM: ESOPHOGRAM/BARIUM SWALLOW  TECHNIQUE: Single contrast examination was performed using  thin barium.  FLUOROSCOPY TIME:  Fluoroscopy Time:  2 minutes, 46 seconds  Number of Acquired Images:  3  COMPARISON:  Chest x-ray of Oct 31, 2014  FINDINGS: The patient was un willing or unable to swallowed the barium preparation in a meaningful fashion. There was no laryngeal penetration of the barium. Small amounts did reach the mid and distal esophagus but  1 cannot assess the status of the esophagus on this study. There is abnormal increased density in the AP window and left infrahilar regions that appears new but is not well evaluated on today's fluoroscopic examination.  IMPRESSION: 1. Nondiagnostic barium swallow examination due to the patient's inability to more than tiny amounts of the barium. CT scanning of the neck and chest would be a useful next imaging step. 2. Possible mass in the AP window. Possible infrahilar pneumonia on the left. 3. The extremely limited nature of the study was discussed by me by telephone with Dr. Lenise Arena in the [facility] emergency department.   Electronically Signed   By: David  Martinique M.D.   On: 03/09/2015 11:48   US Carotid Bilateral  03/10/2015   CLINICAL DATA:  Dysphagia. Remote stroke. Syncope, visual disturbance, hyperlipidemia. Remote endarterectomy 15 years ago, laterality not specified.  EXAM: BILATERAL CAROTID DUPLEX ULTRASOUND  TECHNIQUE: Pearline Cables scale imaging, color Doppler and duplex ultrasound was performed of bilateral carotid and vertebral arteries in the neck.  COMPARISON:  CT 03/09/2015, ultrasound 12/18/2013  REVIEW OF SYSTEMS: Quantification of carotid stenosis is based on velocity parameters that correlate the residual internal carotid diameter with NASCET-based stenosis levels, using the diameter of the distal internal carotid lumen as the denominator for stenosis measurement.  The following velocity measurements were obtained:  PEAK SYSTOLIC/END DIASTOLIC  RIGHT  ICA:                     149/11cm/sec  CCA:                     29/79GX/QJJ  SYSTOLIC ICA/CCA RATIO:  1.8  DIASTOLIC ICA/CCA RATIO: 0.9  ECA:  84cm/sec  LEFT  ICA:                     82/21cm/sec  CCA:                     728/97VN/RWC  SYSTOLIC ICA/CCA RATIO:  0.7  DIASTOLIC ICA/CCA RATIO: 1.6  ECA:                     80cm/sec  FINDINGS: RIGHT CAROTID ARTERY: Eccentric partially calcified plaque effaces the bulb and extends into  the proximal ICA resulting in at least mild stenosis. There mildly elevated peak systolic velocities at the level of plaque in the proximal ICA. Elsewhere normal waveforms and color Doppler signal.  RIGHT VERTEBRAL ARTERY:  Normal flow direction and waveform.  LEFT CAROTID ARTERY: Mild intimal thickening. No focal plaque or stenosis. Normal waveforms and color Doppler signal.  LEFT VERTEBRAL ARTERY: Normal flow direction and waveform.  IMPRESSION: 1. Right carotid bifurcation and proximal ICA plaque, resulting in less than 50% diameter stenosis. The exam does not exclude plaque ulceration or embolization. Continued surveillance recommended. 2. No significant left carotid plaque or stenosis.   Electronically Signed   By: Lucrezia Europe M.D.   On: 03/10/2015 13:36    Review of Systems  Unable to perform ROS: language   Blood pressure 128/71, pulse 79, temperature 97.8 F (36.6 C), temperature source Oral, resp. rate 18, height 4' 11"  (1.499 m), weight 48.081 kg (106 lb), SpO2 97 %. Physical Exam  Nursing note and vitals reviewed. Constitutional: She appears well-developed and well-nourished. No distress.  HENT:  Head: Normocephalic and atraumatic.  Right Ear: External ear normal.  Left Ear: External ear normal.  Nose: Nose normal.  Mouth/Throat: Oropharynx is clear and moist.  Eyes: Conjunctivae and EOM are normal. Pupils are equal, round, and reactive to light.  Neck: Normal range of motion. Neck supple.  Cardiovascular: Normal rate, regular rhythm, normal heart sounds and intact distal pulses.   Respiratory: Effort normal and breath sounds normal.  GI: Soft. Bowel sounds are normal.  Musculoskeletal: Normal range of motion.  Neurological:  A+Ox2 not time, moderate dysarthria, nl langauge PERRLA, EOMI but diplopia on sustained upgaze, nl VF, face symmetic, tongue midline 4/5 B with some fatiguability, nl tone 1+/4 B, mute plantars FTN and HTS WNL Nl pinprick and temp  Skin: Skin is warm and  dry. She is not diaphoretic.  Psychiatric: She has a normal mood and affect.   MRI of brain personally reviewed by me and shows moderate white matter changes  Assessment/Plan: 1.  Probable myasthenia gravis-  This would explain L eye ptosis, difficulty swallowing and changes in voice as well as weakness.  This all seems to fluctuate as well.  Pt is in the second bimodal curve for this disease as well. -  Would NOT EGD tomorrow -  Start Mestinon 23m IV q8h -  Check TSH, acetylcholine ab -  Repeat swallow study on Thursday -  Will follow  SMITH, MATTHEW 03/10/2015, 8:15 PM

## 2015-03-10 NOTE — Consult Note (Signed)
Subjective: Patient seen for dysphagia. Patient is still spitting up oral secretions and workup. Patient denies nausea or abdominal pain.  Objective: Vital signs in last 24 hours: Temp:  [97.4 F (36.3 C)-98 F (36.7 C)] 97.5 F (36.4 C) (09/28 1213) Pulse Rate:  [58-87] 87 (09/28 1213) Resp:  [17-19] 19 (09/28 1213) BP: (133-183)/(71-94) 133/75 mmHg (09/28 1213) SpO2:  [95 %-100 %] 98 % (09/28 1213) Weight:  [48.081 kg (106 lb)] 48.081 kg (106 lb) (09/28 0922) Blood pressure 133/75, pulse 87, temperature 97.5 F (36.4 C), temperature source Oral, resp. rate 19, height  (1.499 m), weight 48.081 kg (106 lb), SpO2 98 %.   Intake/Output from previous day: 09/27 0701 - 09/28 0700 In: 240 [I.V.:240] Out: 4 [Urine:4]  Intake/Output this shift: Total I/O In: 161 [I.V.:161] Out: 0    General appearance:  Elderly female no acute distress Resp:  Occasional coarse rhonchi Cardio:  Irregular rhythm GI:  Soft nontender nondistended bowel sounds positive Extremities:  No clubbing cyanosis or edema   Lab Results: Results for orders placed or performed during the hospital encounter of 03/09/15 (from the past 24 hour(s))  Basic metabolic panel     Status: Abnormal   Collection Time: 03/10/15  6:11 AM  Result Value Ref Range   Sodium 142 135 - 145 mmol/L   Potassium 4.0 3.5 - 5.1 mmol/L   Chloride 108 101 - 111 mmol/L   CO2 21 (L) 22 - 32 mmol/L   Glucose, Bld 74 65 - 99 mg/dL   BUN 17 6 - 20 mg/dL   Creatinine, Ser 1.61 0.44 - 1.00 mg/dL   Calcium 9.2 8.9 - 09.6 mg/dL   GFR calc non Af Amer >60 >60 mL/min   GFR calc Af Amer >60 >60 mL/min   Anion gap 13 5 - 15  CBC     Status: None   Collection Time: 03/10/15  6:11 AM  Result Value Ref Range   WBC 6.1 3.6 - 11.0 K/uL   RBC 4.65 3.80 - 5.20 MIL/uL   Hemoglobin 14.2 12.0 - 16.0 g/dL   HCT 04.5 40.9 - 81.1 %   MCV 92.6 80.0 - 100.0 fL   MCH 30.6 26.0 - 34.0 pg   MCHC 33.1 32.0 - 36.0 g/dL   RDW 91.4 78.2 - 95.6 %    Platelets 257 150 - 440 K/uL      Recent Labs  03/09/15 1035 03/10/15 0611  WBC 5.5 6.1  HGB 14.7 14.2  HCT 43.8 43.1  PLT 248 257   BMET  Recent Labs  03/09/15 1035 03/10/15 0611  NA 143 142  K 3.7 4.0  CL 105 108  CO2 29 21*  GLUCOSE 96 74  BUN 20 17  CREATININE 1.06* 0.79  CALCIUM 9.5 9.2   LFT No results for input(s): PROT, ALBUMIN, AST, ALT, ALKPHOS, BILITOT, BILIDIR, IBILI in the last 72 hours. PT/INR No results for input(s): LABPROT, INR in the last 72 hours. Hepatitis Panel No results for input(s): HEPBSAG, HCVAB, HEPAIGM, HEPBIGM in the last 72 hours. C-Diff No results for input(s): CDIFFTOX in the last 72 hours. No results for input(s): CDIFFPCR in the last 72 hours.   Studies/Results: Ct Head Wo Contrast  03/09/2015   CLINICAL DATA:  81 year old female with headache, weakness, decreased p.o., possible dehydration. Initial encounter.  EXAM: CT HEAD WITHOUT CONTRAST  TECHNIQUE: Contiguous axial images were obtained from the base of the skull through the vertex without intravenous contrast.  COMPARISON:  11/04/2014,  and earlier  FINDINGS: Chronic right mastoid effusion appears stable. Mild paranasal sinus mucosal thickening and small maxillary retention cysts appear stable. Osteopenia. No acute osseous abnormality identified. No acute orbit or scalp soft tissue findings. Negative visualized noncontrast deep soft tissue spaces of the face.  Extensive Calcified atherosclerosis at the skull base. Stable cerebral volume. No ventriculomegaly. Chronic right anterior corona radiata lacunar infarct is stable. Small focus of cortical encephalomalacia in the posterior left temporal/ lateral occipital lobe appears stable (series 4, image 18). No midline shift, mass effect, or evidence of intracranial mass lesion. No acute intracranial hemorrhage identified. No cortically based acute infarct identified. No suspicious intracranial vascular hyperdensity.  IMPRESSION: Stable  noncontrast CT appearance of the brain since May. No acute intracranial abnormality.   Electronically Signed   By: Odessa Fleming M.D.   On: 03/09/2015 11:22   Ct Soft Tissue Neck W Contrast  03/09/2015   CLINICAL DATA:  Dysphagia.  EXAM: CT NECK WITH CONTRAST  TECHNIQUE: Multidetector CT imaging of the neck was performed using the standard protocol following the bolus administration of intravenous contrast.  CONTRAST:  75mL OMNIPAQUE IOHEXOL 300 MG/ML  SOLN  COMPARISON:  None.  FINDINGS: Pharynx and larynx: No definite abnormality seen.  Salivary glands: Parotid and submandibular glands appear normal.  Thyroid: Normal.  Lymph nodes: No significant adenopathy is noted.  Vascular: Moderate calcified plaque is seen involving the proximal right internal carotid artery.  Limited intracranial: No abnormality seen.  Visualized orbits: Normal.  Mastoids and visualized paranasal sinuses: Fluid is noted in right mastoid air cells. Mucous retention cyst seen in right maxillary sinus.  Skeleton: Multilevel degenerative disc disease is noted in the cervical spine.  Upper chest: Visualized lungs appear normal. Atherosclerosis of thoracic aorta is noted without dissection.  IMPRESSION: Moderate calcified plaque is noted in proximal right internal carotid artery; carotid ultrasound is recommended for further evaluation.  Fluid is noted in the right mastoid air cells. Clinical correlation is recommended to rule out mastoiditis.  No other significant abnormality seen in the soft tissues of the neck.   Electronically Signed   By: Lupita Raider, M.D.   On: 03/09/2015 13:19   Ct Chest W Contrast  03/09/2015   CLINICAL DATA:  Pt c/o not being able to swallow good x 1 week. Pt reports only being able to swallow small amt at a time without vomiting. Hx of CVA, no hx of CA or surg.  EXAM: CT CHEST WITH CONTRAST  TECHNIQUE: Multidetector CT imaging of the chest was performed during intravenous contrast administration.  CONTRAST:  75mL  OMNIPAQUE IOHEXOL 300 MG/ML  SOLN  COMPARISON:  Chest x-ray 03/09/2015  FINDINGS: Heart: There is significant coronary artery calcification. Mitral annulus and aortic valve calcification is noted.  Vascular structures: There is dense atherosclerotic calcification of the thoracic aorta. Aorta is tortuous but not aneurysmal. No evidence for dissection.  Mediastinum/thyroid: Large hiatal hernia. The visualized portion of the thyroid gland has a normal appearance. No mediastinal, hilar, or axillary adenopathy. Contrast identified within the distal esophagus and proximal stomach.  Lungs/Airways: No pulmonary nodules, pleural effusions, or infiltrates.  Upper abdomen: Significant colonic diverticular disease. Calcifications are identified within the liver. There is focal fatty infiltration adjacent to the gallbladder fossa region liver. Small renal cysts are present. There is dense atherosclerotic calcification of the abdominal aorta.  Chest wall/osseous structures: There wedge compression fractures of T8, T 11, and L1. No suspicious lytic or blastic lesions are identified. Scoliosis.  IMPRESSION:  1. Significant coronary artery disease, mitral annulus and aortic valve disease. 2. Tortuous aorta without aneurysm. 3. Large hiatal hernia.  No associated obstruction. 4. No significant adenopathy. 5. Significant colonic diverticulosis. 6. Focal fatty infiltration of the liver. 7. Small renal cysts. 8. Hepatic granulomata. 9. Thoracic and lumbar wedge compression fractures associated with scoliosis.   Electronically Signed   By: Norva Pavlov M.D.   On: 03/09/2015 13:19   Mr Brain Wo Contrast  03/10/2015   CLINICAL DATA:  81 year old female with left eye blurred vision and left eye drooping. Generalized weakness and difficulty swallowing for 2 days. Decreased p.o. intake. Altered mental status. Initial encounter.  EXAM: MRI HEAD WITHOUT CONTRAST  TECHNIQUE: Multiplanar, multiecho pulse sequences of the brain and  surrounding structures were obtained without intravenous contrast.  COMPARISON:  Neck CT and head CT 03/09/2015.  Brain MRI 12/18/2013.  FINDINGS: Cerebral volume is stable since 2015. Major intracranial vascular flow voids are stable. No restricted diffusion or evidence of acute infarction.  Chronic lacunar type infarcts in both cerebellar hemispheres, an the right corona radiata. Stable mild to moderate for age T2 heterogeneity in the deep gray matter nuclei and cerebral white matter elsewhere. Probable normal sulcus anatomic variation in the right parietal lobe again noted (series 10, image 20) or less likely small arachnoid cyst.  No midline shift, mass effect, evidence of mass lesion, ventriculomegaly, or acute intracranial hemorrhage. Cervicomedullary junction and pituitary are within normal limits. Stable right mastoid effusion. Stable paranasal sinuses since 2015. Interval right globe postoperative changes. Other orbit and scalp soft tissues are stable. Normal bone marrow signal.  There is chronic cervical spine degeneration, with at least mild spinal stenosis at C3-C4 and C4-C5. This does not appear significantly progressed since 2015.  IMPRESSION: 1. No acute intracranial abnormality. Chronic small vessel ischemic disease is stable since 2015. 2. Chronic cervical spine degeneration with C3-C4 and C4-C5 spinal stenosis which does not appear significantly progressed since 2015.   Electronically Signed   By: Odessa Fleming M.D.   On: 03/10/2015 09:57   Dg Esophagus  03/09/2015   CLINICAL DATA:  The patient reports being unable swallow for the past week with sensation of something being stuck in the throat. The patient is quite weak and unable to stand; there is a history of previous CVA.  EXAM: ESOPHOGRAM/BARIUM SWALLOW  TECHNIQUE: Single contrast examination was performed using  thin barium.  FLUOROSCOPY TIME:  Fluoroscopy Time:  2 minutes, 46 seconds  Number of Acquired Images:  3  COMPARISON:  Chest x-ray of  Oct 31, 2014  FINDINGS: The patient was un willing or unable to swallowed the barium preparation in a meaningful fashion. There was no laryngeal penetration of the barium. Small amounts did reach the mid and distal esophagus but 1 cannot assess the status of the esophagus on this study. There is abnormal increased density in the AP window and left infrahilar regions that appears new but is not well evaluated on today's fluoroscopic examination.  IMPRESSION: 1. Nondiagnostic barium swallow examination due to the patient's inability to more than tiny amounts of the barium. CT scanning of the neck and chest would be a useful next imaging step. 2. Possible mass in the AP window. Possible infrahilar pneumonia on the left. 3. The extremely limited nature of the study was discussed by me by telephone with Dr. Daryel November in the [facility] emergency department.   Electronically Signed   By: David  [place] M.D.   On: 03/09/2015 11:48  Scheduled Inpatient Medications:   . [MAR Hold] aspirin  300 mg Rectal Daily  . [MAR Hold] enoxaparin (LOVENOX) injection  40 mg Subcutaneous Q24H  . [MAR Hold] fluticasone  2 spray Each Nare Daily  . [MAR Hold] pantoprazole (PROTONIX) IV  40 mg Intravenous Q12H  . [MAR Hold] pravastatin  10 mg Oral Daily    Continuous Inpatient Infusions:   . sodium chloride 40 mL/hr at 03/09/15 1815  . sodium chloride 20 mL/hr at 03/09/15 2139    PRN Inpatient Medications:  [MAR Hold] ALPRAZolam, [MAR Hold] ondansetron **OR** [MAR Hold] ondansetron (ZOFRAN) IV  Miscellaneous:   Assessment:  1 dysphagia. Evaluation showing large intrathoracic stomach/ hiatal hernia. 2 other neurologic dysfunction in terms of left eye droop and dysphonia.  Plan:  1. Recommend low clearance prior to sedated procedure. Case discussed with Dr. Thedore Mins and anesthesia. 2.  Christena Deem MD 03/10/2015, 1:31 PM

## 2015-03-11 ENCOUNTER — Inpatient Hospital Stay: Payer: Commercial Managed Care - HMO

## 2015-03-11 ENCOUNTER — Encounter
Admission: EM | Disposition: A | Discharge status: Other Acute Inpatient Hospital | Payer: Self-pay | Source: Home / Self Care | Attending: Internal Medicine

## 2015-03-11 DIAGNOSIS — J9690 Respiratory failure, unspecified, unspecified whether with hypoxia or hypercapnia: Secondary | ICD-10-CM | POA: Insufficient documentation

## 2015-03-11 DIAGNOSIS — G9341 Metabolic encephalopathy: Secondary | ICD-10-CM

## 2015-03-11 DIAGNOSIS — A419 Sepsis, unspecified organism: Secondary | ICD-10-CM

## 2015-03-11 DIAGNOSIS — J9601 Acute respiratory failure with hypoxia: Secondary | ICD-10-CM | POA: Insufficient documentation

## 2015-03-11 DIAGNOSIS — Z01818 Encounter for other preprocedural examination: Secondary | ICD-10-CM | POA: Diagnosis not present

## 2015-03-11 LAB — CBC WITH DIFFERENTIAL/PLATELET
BASOS ABS: 0 10*3/uL (ref 0–0.1)
Basophils Relative: 0 %
EOS PCT: 0 %
Eosinophils Absolute: 0 10*3/uL (ref 0–0.7)
HCT: 39.4 % (ref 35.0–47.0)
Hemoglobin: 12.8 g/dL (ref 12.0–16.0)
LYMPHS PCT: 3 %
Lymphs Abs: 0.5 10*3/uL — ABNORMAL LOW (ref 1.0–3.6)
MCH: 30.3 pg (ref 26.0–34.0)
MCHC: 32.4 g/dL (ref 32.0–36.0)
MCV: 93.6 fL (ref 80.0–100.0)
Monocytes Absolute: 1.3 10*3/uL — ABNORMAL HIGH (ref 0.2–0.9)
Monocytes Relative: 6 %
NEUTROS ABS: 19.2 10*3/uL — AB (ref 1.4–6.5)
Neutrophils Relative %: 91 %
PLATELETS: 285 10*3/uL (ref 150–440)
RBC: 4.21 MIL/uL (ref 3.80–5.20)
RDW: 13.5 % (ref 11.5–14.5)
WBC: 21.2 10*3/uL — AB (ref 3.6–11.0)

## 2015-03-11 LAB — BLOOD GAS, ARTERIAL
ACID-BASE DEFICIT: 8.2 mmol/L — AB (ref 0.0–2.0)
Allens test (pass/fail): POSITIVE — AB
Bicarbonate: 17.9 mEq/L — ABNORMAL LOW (ref 21.0–28.0)
FIO2: 40
LHR: 14 {breaths}/min
O2 SAT: 96.4 %
PCO2 ART: 38 mmHg (ref 32.0–48.0)
PEEP/CPAP: 5 cmH2O
PH ART: 7.28 — AB (ref 7.350–7.450)
Patient temperature: 37
VT: 400 mL
pO2, Arterial: 95 mmHg (ref 83.0–108.0)

## 2015-03-11 LAB — LIPID PANEL
CHOL/HDL RATIO: 3 ratio
CHOLESTEROL: 131 mg/dL (ref 0–200)
HDL: 44 mg/dL (ref >40)
LDL Cholesterol: 71 mg/dL (ref 0–99)
TRIGLYCERIDES: 82 mg/dL (ref <150)
VLDL: 16 mg/dL (ref 0–40)

## 2015-03-11 LAB — COMPREHENSIVE METABOLIC PANEL
ALT: 12 U/L — ABNORMAL LOW (ref 14–54)
ANION GAP: 13 (ref 5–15)
AST: 25 U/L (ref 15–41)
Albumin: 3.5 g/dL (ref 3.5–5.0)
Alkaline Phosphatase: 94 U/L (ref 38–126)
BILIRUBIN TOTAL: 1.6 mg/dL — AB (ref 0.3–1.2)
BUN: 19 mg/dL (ref 6–20)
CO2: 17 mmol/L — ABNORMAL LOW (ref 22–32)
Calcium: 8.1 mg/dL — ABNORMAL LOW (ref 8.9–10.3)
Chloride: 105 mmol/L (ref 101–111)
Creatinine, Ser: 1 mg/dL (ref 0.44–1.00)
GFR calc Af Amer: 57 mL/min — ABNORMAL LOW (ref >60)
GFR, EST NON AFRICAN AMERICAN: 49 mL/min — AB (ref >60)
Glucose, Bld: 228 mg/dL — ABNORMAL HIGH (ref 65–99)
POTASSIUM: 3.1 mmol/L — AB (ref 3.5–5.1)
Sodium: 135 mmol/L (ref 135–145)
TOTAL PROTEIN: 6 g/dL — AB (ref 6.5–8.1)

## 2015-03-11 LAB — GLUCOSE, CAPILLARY
GLUCOSE-CAPILLARY: 80 mg/dL (ref 65–99)
Glucose-Capillary: 86 mg/dL (ref 65–99)

## 2015-03-11 LAB — TROPONIN I

## 2015-03-11 LAB — CKMB (ARMC ONLY): CK, MB: 4.7 ng/mL (ref 0.5–5.0)

## 2015-03-11 LAB — PROCALCITONIN: PROCALCITONIN: 0.1 ng/mL

## 2015-03-11 SURGERY — EGD (ESOPHAGOGASTRODUODENOSCOPY)
Anesthesia: General

## 2015-03-11 MED ORDER — SODIUM CHLORIDE 0.9 % IV SOLN
500.0000 mg | INTRAVENOUS | Status: DC
  Administered 2015-03-11 – 2015-03-14 (×4): 500 mg via INTRAVENOUS
  Filled 2015-03-11 (×5): qty 500

## 2015-03-11 MED ORDER — NOREPINEPHRINE 4 MG/250ML-% IV SOLN
0.0000 ug/min | INTRAVENOUS | Status: DC
  Administered 2015-03-11: 10 ug/min via INTRAVENOUS
  Administered 2015-03-11: 20 ug/min via INTRAVENOUS
  Administered 2015-03-11 – 2015-03-12 (×2): 10 ug/min via INTRAVENOUS
  Filled 2015-03-11 (×3): qty 250

## 2015-03-11 MED ORDER — ANTISEPTIC ORAL RINSE SOLUTION (CORINZ)
7.0000 mL | Freq: Four times a day (QID) | OROMUCOSAL | Status: DC
  Administered 2015-03-11 – 2015-03-15 (×13): 7 mL via OROMUCOSAL
  Filled 2015-03-11 (×20): qty 7

## 2015-03-11 MED ORDER — VANCOMYCIN HCL 500 MG IV SOLR
500.0000 mg | INTRAVENOUS | Status: AC
  Administered 2015-03-11: 500 mg via INTRAVENOUS
  Filled 2015-03-11: qty 500

## 2015-03-11 MED ORDER — NOREPINEPHRINE BITARTRATE 1 MG/ML IV SOLN: 0.0000 ug/min | INTRAVENOUS | Status: DC

## 2015-03-11 MED ORDER — FENTANYL CITRATE (PF) 100 MCG/2ML IJ SOLN
50.0000 ug | Freq: Once | INTRAMUSCULAR | Status: AC
  Administered 2015-03-11: 50 ug via INTRAVENOUS
  Filled 2015-03-11: qty 2

## 2015-03-11 MED ORDER — PYRIDOSTIGMINE BROMIDE 60 MG PO TABS
60.0000 mg | ORAL_TABLET | Freq: Three times a day (TID) | ORAL | Status: DC
  Administered 2015-03-11 – 2015-03-12 (×5): 60 mg via ORAL
  Filled 2015-03-11 (×7): qty 1

## 2015-03-11 MED ORDER — VECURONIUM BROMIDE 10 MG IV SOLR
10.0000 mg | Freq: Once | INTRAVENOUS | Status: AC
  Administered 2015-03-11: 10 mg via INTRAVENOUS

## 2015-03-11 MED ORDER — CHLORHEXIDINE GLUCONATE 0.12% ORAL RINSE (MEDLINE KIT)
15.0000 mL | Freq: Two times a day (BID) | OROMUCOSAL | Status: DC
  Administered 2015-03-11 – 2015-03-15 (×8): 15 mL via OROMUCOSAL
  Filled 2015-03-11 (×11): qty 15

## 2015-03-11 MED ORDER — FENTANYL CITRATE (PF) 100 MCG/2ML IJ SOLN
100.0000 ug | Freq: Once | INTRAMUSCULAR | Status: AC
  Administered 2015-03-11: 100 ug via INTRAVENOUS

## 2015-03-11 MED ORDER — MIDAZOLAM HCL 2 MG/2ML IJ SOLN: 1.0000 mg | INTRAMUSCULAR | Status: DC | PRN

## 2015-03-11 MED ORDER — PREDNISONE 20 MG PO TABS
40.0000 mg | ORAL_TABLET | Freq: Every day | ORAL | Status: DC
  Administered 2015-03-12: 40 mg via ORAL
  Filled 2015-03-11: qty 2

## 2015-03-11 MED ORDER — HALOPERIDOL LACTATE 5 MG/ML IJ SOLN
2.0000 mg | Freq: Once | INTRAMUSCULAR | Status: AC
  Administered 2015-03-11: 2 mg via INTRAVENOUS
  Filled 2015-03-11: qty 1

## 2015-03-11 MED ORDER — PIPERACILLIN-TAZOBACTAM 3.375 G IVPB
3.3750 g | Freq: Three times a day (TID) | INTRAVENOUS | Status: DC
  Administered 2015-03-11 – 2015-03-15 (×13): 3.375 g via INTRAVENOUS
  Filled 2015-03-11 (×16): qty 50

## 2015-03-11 MED ORDER — SENNOSIDES-DOCUSATE SODIUM 8.6-50 MG PO TABS: 1.0000 | ORAL_TABLET | Freq: Two times a day (BID) | ORAL | Status: DC | PRN

## 2015-03-11 MED ORDER — NOREPINEPHRINE 4 MG/250ML-% IV SOLN
INTRAVENOUS | Status: AC
  Administered 2015-03-11: 4 mg
  Filled 2015-03-11: qty 250

## 2015-03-11 MED ORDER — IPRATROPIUM-ALBUTEROL 0.5-2.5 (3) MG/3ML IN SOLN
3.0000 mL | Freq: Four times a day (QID) | RESPIRATORY_TRACT | Status: DC
  Administered 2015-03-11 – 2015-03-15 (×18): 3 mL via RESPIRATORY_TRACT
  Filled 2015-03-11 (×18): qty 3

## 2015-03-11 MED ORDER — LORAZEPAM 2 MG/ML IJ SOLN
0.5000 mg | Freq: Once | INTRAMUSCULAR | Status: AC
  Administered 2015-03-11: 0.5 mg via INTRAVENOUS
  Filled 2015-03-11: qty 1

## 2015-03-11 MED ORDER — MIDAZOLAM HCL 2 MG/2ML IJ SOLN
2.0000 mg | Freq: Once | INTRAMUSCULAR | Status: AC
  Administered 2015-03-11: 2 mg via INTRAVENOUS

## 2015-03-11 MED ORDER — DOCUSATE SODIUM 50 MG/5ML PO LIQD: 100.0000 mg | Freq: Two times a day (BID) | ORAL | Status: DC | PRN

## 2015-03-11 MED ORDER — FENTANYL BOLUS VIA INFUSION
25.0000 ug | INTRAVENOUS | Status: DC | PRN
  Filled 2015-03-11: qty 25

## 2015-03-11 MED ORDER — FENTANYL 2500MCG IN NS 250ML (10MCG/ML) PREMIX INFUSION
25.0000 ug/h | INTRAVENOUS | Status: DC
  Administered 2015-03-11: 50 ug/h via INTRAVENOUS
  Filled 2015-03-11: qty 250

## 2015-03-11 NOTE — Procedures (Signed)
Date: 03/11/2015,     PHYSICIAN:  Cerenity Goshorn  Indications/Preliminary Diagnosis: respiratory failure, mucous plugging  Consent: (Place X beside choice/s below)  The benefits, risks and possible complications of the procedure were        explained to:  ___ patient  _x__ patient's family  ___ other:___________  who verbalized understanding and gave:  __x_ verbal  ___ written  ___ verbal and written  ___ telephone  ___ other:________ consent.      Unable to obtain consent; procedure performed on emergent basis.     Other:       PRESEDATION ASSESSMENT: History and Physical has been performed. Patient meds and allergies have been reviewed. Presedation airway examination has been performed and documented. Baseline vital signs, sedation score, oxygenation status, and cardiac rhythm were reviewed. Patient was deemed to be in satisfactory condition to undergo the procedure.  PREMEDICATIONS:   Sedative/Narcotic Amt Dose   Versed  mg   Fentanyl  mcg  Diprivan  mg        Airway Prep (Place X beside choice below)   1% Transtracheal Lidocaine Anesthetization 7 cc   Patient prepped per Bronchoscopy Lab Policy       Insertion Route (Place X beside choice below)   Nasal   Oral   Endotracheal Tube   Tracheostomy   INTRAPROCEDURE MEDICATIONS:  Sedative/Narcotic Amt Dose   Versed  mg   Fentanyl  mcg  Diprivan  mg       Medication Amt Dose  Medication Amt Dose  Xylocaine 2%  cc  Epinephrine 1:10,000 sol  cc  Xylocaine 4%  cc  Cocaine  cc   TECHNICAL PROCEDURES: (Place X beside choice below)   Procedures  Description    None     Electrocautery     Cryotherapy     Balloon Dilatation     Bronchography     Stent Placement   x  Therapeutic Aspiration     Laser/Argon Plasma    Brachytherapy Catheter Placement    Foreign Body Removal     SPECIMENS (Sites): (Place X beside choice below)  Specimens Description   No Specimens Obtained     Washings   x Lavage RML   Biopsies    Fine Needle Aspirates    Brushings    Sputum    FINDINGS: thick secretions throughout the entire lung, mainly in the left  ESTIMATED BLOOD LOSS:   COMPLICATIONS/RESOLUTION:   PROCEDURE DETAILS: Timeout performed and correct patient, name, & ID confirmed. Following prep per Pulmonary policy, appropriate sedation was administered.  Airway exam proceeded with findings, technical procedures, and specimen collection as noted below. At the end of exam the scope was withdrawn without incident. Impression and Plan as noted below.   Inspection: Right Airways - thick, white, pus-like secretions noted in the right lung, RML with moderate erythema of the mucosa. BAL of RML No endobronchial lesions.   Left Airways - moderate thick, white, pus like secretions, mucus plug aspirated from LUL and LLL. No endobronchial lesion. s  Procedure: BAL of the RML    IMPLANTED DEVICE(S):none  IMPRESSION:POST-PROCEDURE DX: PNA, mucus  RECOMMENDATION/PLAN: follow cultures from BAL    ADDITIONAL COMMENTS:none   Procedure Time:10 mins   Stephanie Acre, MD [facility] Pager : [phone] (Please enter 7 digits)

## 2015-03-11 NOTE — Consult Note (Signed)
Subjective: Patient seen for dysphagia.  Events noted, possible aspiration event, now intubated, sedated.  Bronchoscopy noted, PNA.  Appreciate neurology consult.    Objective: Vital signs in last 24 hours: Temp:  [97.1 F (36.2 C)-97.7 F (36.5 C)] 97.1 F (36.2 C) (09/29 1007) Pulse Rate:  [73-84] 84 (09/29 1500) Resp:  [14-18] 14 (09/29 1500) BP: (71-167)/(45-72) 141/63 mmHg (09/29 1500) SpO2:  [95 %-100 %] 100 % (09/29 1500) FiO2 (%):  [40 %] 40 % (09/29 1520) Blood pressure 141/63, pulse 84, temperature 97.1 F (36.2 C), temperature source Axillary, resp. rate 14, height  (1.499 m), weight 48.081 kg (106 lb), SpO2 100 %.   Intake/Output from previous day: 09/28 0701 - 09/29 0700 In: 468.9 [I.V.:468.9] Out: 200 [Urine:200]  Intake/Output this shift: Total I/O In: 926.8 [I.V.:776.8; IV Piggyback:150] Out: -    General appearance:  Elderly on vent non-responsive Resp:  Coarse rhonchi Cardio: rrr GI:  Soft nondistended Extremities:     Lab Results: Results for orders placed or performed during the hospital encounter of 03/09/15 (from the past 24 hour(s))  Lipid panel     Status: None   Collection Time: 03/10/15  8:35 PM  Result Value Ref Range   Cholesterol 131 0 - 200 mg/dL   Triglycerides 82 <841 mg/dL   HDL 44 >32 mg/dL   Total CHOL/HDL Ratio 3.0 RATIO   VLDL 16 0 - 40 mg/dL   LDL Cholesterol 71 0 - 99 mg/dL  TSH     Status: None   Collection Time: 03/10/15  8:35 PM  Result Value Ref Range   TSH 2.910 0.350 - 4.500 uIU/mL  Glucose, capillary     Status: None   Collection Time: 03/11/15 10:34 AM  Result Value Ref Range   Glucose-Capillary 80 65 - 99 mg/dL   Comment 1 Notify RN   Draw ABG 1 hour after initiation of ventilator     Status: Abnormal   Collection Time: 03/11/15 12:55 PM  Result Value Ref Range   FIO2 40.00    Delivery systems VENTILATOR    Mode PRESSURE REGULATED VOLUME CONTROL    VT 400 mL   LHR 14 resp/min   Peep/cpap 5.0 cm H20    pH, Arterial 7.28 (L) 7.350 - 7.450   pCO2 arterial 38 32.0 - 48.0 mmHg   pO2, Arterial 95 83.0 - 108.0 mmHg   Bicarbonate 17.9 (L) 21.0 - 28.0 mEq/L   Acid-base deficit 8.2 (H) 0.0 - 2.0 mmol/L   O2 Saturation 96.4 %   Patient temperature 37.0    Collection site RIGHT RADIAL    Sample type ARTERIAL DRAW    Allens test (pass/fail) POSITIVE (A) PASS      Recent Labs  03/09/15 1035 03/10/15 0611  WBC 5.5 6.1  HGB 14.7 14.2  HCT 43.8 43.1  PLT 248 257   BMET  Recent Labs  03/09/15 1035 03/10/15 0611  NA 143 142  K 3.7 4.0  CL 105 108  CO2 29 21*  GLUCOSE 96 74  BUN 20 17  CREATININE 1.06* 0.79  CALCIUM 9.5 9.2   LFT No results for input(s): PROT, ALBUMIN, AST, ALT, ALKPHOS, BILITOT, BILIDIR, IBILI in the last 72 hours. PT/INR  Recent Labs  03/10/15 1420  LABPROT 13.9  INR 1.05   Hepatitis Panel No results for input(s): HEPBSAG, HCVAB, HEPAIGM, HEPBIGM in the last 72 hours. C-Diff No results for input(s): CDIFFTOX in the last 72 hours. No results for input(s): CDIFFPCR in the  last 72 hours.   Studies/Results: Mr Cheyenne Boone Contrast  03/10/2015   CLINICAL DATA:  81 year old female with left eye blurred vision and left eye drooping. Generalized weakness and difficulty swallowing for 2 days. Decreased p.o. intake. Altered mental status. Initial encounter.  EXAM: MRI HEAD WITHOUT CONTRAST  TECHNIQUE: Multiplanar, multiecho pulse sequences of the brain and surrounding structures were obtained without intravenous contrast.  COMPARISON:  Neck CT and head CT 03/09/2015.  Brain MRI 12/18/2013.  FINDINGS: Cerebral volume is stable since 2015. Major intracranial vascular flow voids are stable. No restricted diffusion or evidence of acute infarction.  Chronic lacunar type infarcts in both cerebellar hemispheres, an the right corona radiata. Stable mild to moderate for age T2 heterogeneity in the deep gray matter nuclei and cerebral white matter elsewhere. Probable normal  sulcus anatomic variation in the right parietal lobe again noted (series 10, image 20) or less likely small arachnoid cyst.  No midline shift, mass effect, evidence of mass lesion, ventriculomegaly, or acute intracranial hemorrhage. Cervicomedullary junction and pituitary are within normal limits. Stable right mastoid effusion. Stable paranasal sinuses since 2015. Interval right globe postoperative changes. Other orbit and scalp soft tissues are stable. Normal bone marrow signal.  There is chronic cervical spine degeneration, with at least mild spinal stenosis at C3-C4 and C4-C5. This does not appear significantly progressed since 2015.  IMPRESSION: 1. No acute intracranial abnormality. Chronic small vessel ischemic disease is stable since 2015. 2. Chronic cervical spine degeneration with C3-C4 and C4-C5 spinal stenosis which does not appear significantly progressed since 2015.   Electronically Signed   By: Odessa Fleming M.D.   On: 03/10/2015 09:57   US Carotid Bilateral  03/10/2015   CLINICAL DATA:  Dysphagia. Remote stroke. Syncope, visual disturbance, hyperlipidemia. Remote endarterectomy 15 years ago, laterality not specified.  EXAM: BILATERAL CAROTID DUPLEX ULTRASOUND  TECHNIQUE: Wallace Cullens scale imaging, color Doppler and duplex ultrasound was performed of bilateral carotid and vertebral arteries in the neck.  COMPARISON:  CT 03/09/2015, ultrasound 12/18/2013  REVIEW OF SYSTEMS: Quantification of carotid stenosis is based on velocity parameters that correlate the residual internal carotid diameter with NASCET-based stenosis levels, using the diameter of the distal internal carotid lumen as the denominator for stenosis measurement.  The following velocity measurements were obtained:  PEAK SYSTOLIC/END DIASTOLIC  RIGHT  ICA:                     149/11cm/sec  CCA:                     84/12cm/sec  SYSTOLIC ICA/CCA RATIO:  1.8  DIASTOLIC ICA/CCA RATIO: 0.9  ECA:                     84cm/sec  LEFT  ICA:                      82/21cm/sec  CCA:                     121/13cm/sec  SYSTOLIC ICA/CCA RATIO:  0.7  DIASTOLIC ICA/CCA RATIO: 1.6  ECA:                     80cm/sec  FINDINGS: RIGHT CAROTID ARTERY: Eccentric partially calcified plaque effaces the bulb and extends into the proximal ICA resulting in at least mild stenosis. There mildly elevated peak systolic velocities at the level of plaque in the proximal ICA. Elsewhere  normal waveforms and color Doppler signal.  RIGHT VERTEBRAL ARTERY:  Normal flow direction and waveform.  LEFT CAROTID ARTERY: Mild intimal thickening. No focal plaque or stenosis. Normal waveforms and color Doppler signal.  LEFT VERTEBRAL ARTERY: Normal flow direction and waveform.  IMPRESSION: 1. Right carotid bifurcation and proximal ICA plaque, resulting in less than 50% diameter stenosis. The exam does not exclude plaque ulceration or embolization. Continued surveillance recommended. 2. No significant left carotid plaque or stenosis.   Electronically Signed   By: Corlis Leak M.D.   On: 03/10/2015 13:36   Portable Chest Xray  03/11/2015   CLINICAL DATA:  Respiratory failure.  Intubation  EXAM: PORTABLE CHEST 1 VIEW  COMPARISON:  03/09/2015  FINDINGS: Endotracheal tube enters the right main bronchus. Recommend withdrawal 3 of 4 cm.  The right lung is hyperinflated. There is collapse in the left lower lobe.  NG tube coiled in the hiatal hernia above the diaphragm.  Negative for heart failure.  IMPRESSION: Endotracheal tube right main bronchus, recommend withdrawal of 3-4 cm.  Collapse of left lower lobe  NG tube coiled in the hiatal hernia  These results were called by telephone at the time of interpretation on 03/11/2015 at 12:46 pm to [facility], RN , who verbally acknowledged these results.   Electronically Signed   By: Marlan Palau M.D.   On: 03/11/2015 12:47    Scheduled Inpatient Medications:   . antiseptic oral rinse  7 mL Mouth Rinse QID  . aspirin  300 mg Rectal Daily  . chlorhexidine gluconate  15 mL  Mouth Rinse BID  . enoxaparin (LOVENOX) injection  40 mg Subcutaneous Q24H  . fluticasone  2 spray Each Nare Daily  . ipratropium-albuterol  3 mL Nebulization Q6H  . pantoprazole (PROTONIX) IV  40 mg Intravenous Q12H  . piperacillin-tazobactam (ZOSYN)  IV  3.375 g Intravenous 3 times per day  . pravastatin  10 mg Oral Daily  . [START ON 03/12/2015] predniSONE  40 mg Oral Q breakfast  . pyridostigmine  60 mg Oral 3 times per day  . vancomycin  500 mg Intravenous Q18H    Continuous Inpatient Infusions:   . sodium chloride 40 mL/hr at 03/11/15 1337  . fentaNYL infusion INTRAVENOUS 25 mcg/hr (03/11/15 1500)    PRN Inpatient Medications:  ALPRAZolam, fentaNYL, midazolam, midazolam, ondansetron **OR** ondansetron (ZOFRAN) IV, senna-docusate  Miscellaneous:   Assessment:  1) dysphagia-possible myasthenia gravis.  Hiatal hernia and esophageal tortuosity noted on imaging.   Neurology recs noted.   Plan:  1) Will await further neuro recs and stabilization prior to luminal evaluation, although may be best done prior to extubation if an option at all.  Following.   Christena Deem MD 03/11/2015, 3:39 PM

## 2015-03-11 NOTE — Progress Notes (Signed)
PT Cancellation Note  Patient Details Name: Cheyenne Boone MRN: [id number] DOB: [birth year]   Cancelled Treatment:    Reason Eval/Treat Not Completed: Medical issues which prohibited therapy (Patient noted with decline in medical status (status post rapid response with transfer to CCU).  Will require new orders to resume PT services due to change in status and transfer to higher level of care.  Please re-consult as appropriate.)   Kristen H. Manson Passey, PT, DPT, NCS 03/11/2015, 11:40 AM [phone]

## 2015-03-11 NOTE — Progress Notes (Signed)
   03/11/15 1441  Clinical Encounter Type  Visited With Patient  Visit Type Follow-up;Spiritual support  Referral From Chaplain  Consult/Referral To Chaplain  Spiritual Encounters  Spiritual Needs Emotional  Stress Factors  Patient Stress Factors None identified  Chaplain rounded in the unit and followed up with patient from on-call chaplain's referral. Went in to prayer and provide a compassionate presence. Did not see any family present. Chaplain Sonya A. Laws Ext. [phone]

## 2015-03-11 NOTE — Progress Notes (Signed)
Initial Nutrition Assessment    INTERVENTION:   EN: if unable to extubate within 24 hours, recommend initiation of nutrition support (EN)  NUTRITION DIAGNOSIS:   Inadequate oral intake related to dysphagia, acute illness as evidenced by NPO status, per patient/family report.  GOAL:   Provide needs based on ASPEN/SCCM guidelines  MONITOR:    (Energy Intake, Anthropometrics, Electrolyte/Renal Profile, Digestive System, Pulmonary)  REASON FOR ASSESSMENT:   Malnutrition Screening Tool, Ventilator, Diagnosis    ASSESSMENT:    Pt admitted with difficulty speaking, left eye drooping and difficulty swallowing with probable myasthenia gravis with neurology and GI consulted; this AM but confused, pulling at IV, combative; then developed acute respiratory failure with concern for aspiration, requiring transfer to ICU with subsequent intubation  Past Medical History  Diagnosis Date  . Hypertension   . Hypercholesteremia   . Stroke     Diet Order:  Diet NPO time specified   Food and Nutrition Related History: per MD notes, pt with inability to eat or drink anything including water for 2 weeks prior to admission due to dysphagia  Electrolyte and Renal Profile:  Recent Labs Lab 03/09/15 1035 03/10/15 0611  BUN 20 17  CREATININE 1.06* 0.79  NA 143 142  K 3.7 4.0   Glucose Profile:   Recent Labs  03/11/15 1034  GLUCAP 80   Nutritional Anemia Profile:  CBC Latest Ref Rng 03/10/2015 03/09/2015 11/04/2014  WBC 3.6 - 11.0 K/uL 6.1 5.5 13.1(H)  Hemoglobin 12.0 - 16.0 g/dL 96.0 45.4 0.9(W)  Hematocrit 35.0 - 47.0 % 43.1 43.8 25.0(L)  Platelets 150 - 440 K/uL 257 248 201   Meds: NS at 40 ml/hr  Height:   Ht Readings from Last 1 Encounters:  03/09/15  (1.499 m)    Weight: per weight encounters, pt with 23% wt loss since May (4 months)  Wt Readings from Last 1 Encounters:  03/09/15 106 lb (48.081 kg)    Wt Readings from Last 10 Encounters:  03/09/15 106 lb  (48.081 kg)  03/10/15 106 lb (48.081 kg)  02/28/15 115 lb (52.164 kg)  11/01/14 138 lb (62.596 kg)   Nutrition Focused Physical Exam:  Unable to complete Nutrition-Focused physical exam at this time.   BMI:  Body mass index is 21.4 kg/(m^2).  Estimated Nutritional Needs:   Kcal:  852 kcals (BEE 817, Ve: 5.4, Tmax: 36.6) but will aim to provide at least 1000 kcals per day  Protein:  72-96 g (1.5-2.0 g/kg)   Fluid:  1440-1680 mL (30-35 ml/kg)   HIGH Care Level  Romelle Starcher MS, RD, LDN [phone] Pager

## 2015-03-11 NOTE — Progress Notes (Signed)
Pharmacy Consult for Vancomycin/Zosyn Indication: Sepsis/possible aspiration PNA  No Known Allergies  Patient Measurements: Height:  (149.9 cm) Weight: 106 lb (48.081 kg) IBW/kg (Calculated) : 43.2   Vital Signs: Temp: 97.1 F (36.2 C) (09/29 1007) Temp Source: Axillary (09/29 1007) BP: 71/45 mmHg (09/29 1100) Pulse Rate: 83 (09/29 1100) Intake/Output from previous day: 09/28 0701 - 09/29 0700 In: 468.9 [I.V.:468.9] Out: 200 [Urine:200] Intake/Output from this shift:    Labs:  Recent Labs  03/09/15 1035 03/10/15 0611  WBC 5.5 6.1  HGB 14.7 14.2  PLT 248 257  CREATININE 1.06* 0.79   Estimated Creatinine Clearance: 33.2 mL/min (by C-G formula based on Cr of 0.79). No results for input(s): VANCOTROUGH, VANCOPEAK, VANCORANDOM, GENTTROUGH, GENTPEAK, GENTRANDOM, TOBRATROUGH, TOBRAPEAK, TOBRARND, AMIKACINPEAK, AMIKACINTROU, AMIKACIN in the last 72 hours.   Microbiology: No results found for this or any previous visit (from the past 720 hour(s)).  Medical History: Past Medical History  Diagnosis Date  . Hypertension   . Hypercholesteremia   . Stroke     Medications:  Scheduled:  . antiseptic oral rinse  7 mL Mouth Rinse QID  . aspirin  300 mg Rectal Daily  . chlorhexidine gluconate  15 mL Mouth Rinse BID  . enoxaparin (LOVENOX) injection  40 mg Subcutaneous Q24H  . fentaNYL (SUBLIMAZE) injection  50 mcg Intravenous Once  . fluticasone  2 spray Each Nare Daily  . ipratropium-albuterol  3 mL Nebulization Q6H  . pantoprazole (PROTONIX) IV  40 mg Intravenous Q12H  . piperacillin-tazobactam (ZOSYN)  IV  3.375 g Intravenous 3 times per day  . pravastatin  10 mg Oral Daily  . pyridostigmine  2 mg Intravenous 3 times per day  . vancomycin  500 mg Intravenous STAT  . vancomycin  500 mg Intravenous Q18H   Infusions:  . sodium chloride 40 mL/hr at 03/10/15 1944  . fentaNYL infusion INTRAVENOUS     PRN: ALPRAZolam, docusate, fentaNYL, midazolam, midazolam,  ondansetron **OR** ondansetron (ZOFRAN) IV  Assessment: 81 y/o F admitted with dysphagia developing acute respiratory failure ordered empiric abx for sepsis and possible aspiration PNA.   Goal of Therapy:  Vancomycin trough level 15-20 mcg/ml  Plan:  1. Vancomycin 500 mg iv q 18 hours with stacked dosing and a trough with the 5th dose.   2. Zosyn 3.375 g EI q 8 hours.   Will f/u renal function and culture results.   Luisa Hart D 03/11/2015,1:03 PM

## 2015-03-11 NOTE — Procedures (Signed)
  Procedure Note: Central Venous Catheter Placement [facility] VEIN RIKI BERNINGER , [id number] , IC14A/IC14A-AA  Indications: Hemodynamic monitoring / Intravenous access  Emergent placement of CVL. 2 attempts made for the RIJ using U/S, but unsuccessful, procedure aborted and switched to femoral access.  A time-out was completed verifying correct patient, procedure and site. A 3 lumen catheter available at the time of procedure.  The patient was placed in a dependent position appropriate for central line placement based on the vein to be cannulated.  The patient's RIGHT Femoral Vein was prepped and draped in a sterile fashion. 1% Lidocaine WAS NOT used to anesthetize the surrounding skin area.  A 3 lumen catheter was introduced into the RIGHT Femoral Vein using Seldinger technique, visualized under ultrasound.  The catheter was threaded smoothly over the guide wire and appropriate blood return was obtained.  Each lumen of the catheter was evacuated of air and flushed with sterile saline.  The catheter was then sutured in place to the skin and a sterile dressing applied.  Perfusion to the extremity distal to the point of catheter insertion was checked and found to be adequate.    The patient tolerated the procedure well and there were no complications.  Stephanie Acre, MD  Pulmonary and Critical Care Pager [phone] (please enter 7-digits) On Call Pager - [phone] (please enter 7-digits)

## 2015-03-11 NOTE — Progress Notes (Signed)
   03/11/15 1030  Clinical Encounter Type  Visited With Family  Visit Type Code  Referral From Nurse  Consult/Referral To Chaplain  Responded to Rapid Response code in patient's room.  Nurse said that pt was set to be discharged but having trouble breathing.  Nursing staff also told me that pt would be intubated and taken to CCU.  Spoke to pt.'s daughter briefly and she said she would like me to follow up with them later today or tonight if possible.  [employer] [place]-pager [phone]

## 2015-03-11 NOTE — Care Management Important Message (Signed)
Important Message  Patient Details  Name: Cheyenne Boone MRN: [id number] Date of Birth: 12-16-1923   Medicare Important Message Given:  Yes-second notification given    Verita Schneiders Allmond 03/11/2015, 9:33 AM

## 2015-03-11 NOTE — Clinical Social Work Note (Signed)
CSW was about to go visit with patient and her daughter regarding PT's recommendation for STR but a rapid response was called and patient was subsequently transferred to ICU. CSW will follow up when a more appropriate time becomes available. York Spaniel MSW,LCSW [phone]

## 2015-03-11 NOTE — Progress Notes (Signed)
NEUROLOGY NOTE  S: Pt had episode of unresponsiveness this morning;  Pt was intubated this morning and placed in ICU.  Still showing signs of respiratory failure.  ROS unobtainable  O: 97.1 128/69 83 18  Moderate distress, nl weight Normocephalic, oropharynx clear Supple, no JVD Coarse BS, decreased movement RRR, no murmur No C/C/E   Intubated, not sedated, does not open or follow, GCS 3T PERRLA, +corneals weak, weak cough No movement to pain  A/P: 1.  Probable myasthenia gravis-  Pt never received Mestinon dosing as prescribed yesterday which could complicate breathing 2.  Encephalopathy-  New today and secondary to pneumonia and prior medications;  Nutrition could be contributing to this as well -  Recommend transfer to OSH with ability to perform plasma exchange for 1. -  Needs some type of nutrition -  Pt needs IV Mestinon as written -  Will start Prednisone PO once pt has PO route established -  Avoid flouroquinolones and imipenums when treating aspiration because these worsen MG -  Labs are pending -  Continue respiratory support and check FVCs prior to extubation -  Will follow closely until pt gets transferred  40 minutes of critical care time spent, examining pt, reviewing chart and coordinating care

## 2015-03-11 NOTE — Consult Note (Addendum)
PULMONARY / CRITICAL CARE MEDICINE   Name: Cheyenne Boone MRN: [id number] DOB: April 20, 1924    ADMISSION DATE:  03/09/2015 CONSULTATION DATE:  03/11/15  REFERRING MD :  Dr. Luberta Mutter   CHIEF COMPLAINT:   Respiratory failure, acute   HISTORY OF PRESENT ILLNESS  History per daughter and chart review.  81 y.o. female with a known history of hypertension anxiety, glaucoma. Secondary to difficulty swallowing. Patient is having difficulty swallowing for the past 10 days. She lives alone but her daughter is there next door to take care of her. According to the patient and her daughter patient not eating well and drinking secondary to difficulty swallowing. Patient says that her particular she states gets a stuck in the throat level and then comes right back out. Denies abdominal pain. No nausea no vomiting no diarrhea, no recent illnesses. Also noticed that the patient has a trouble getting the words out. According to the daughter 2 days ago she daughter noticed that she had left I blurred vision and left eye drooping. Weakness of hands or legs. Uses walker at home. No headache. There are also stated that she was recently seen at her primary care physician, for sinus congestion, was given nasal steroids. Today patient noted to have worsening respiratory status on the telemetry floor, was placed on nonrebreather, noted to have hypoxia, was in respiratory distress, transferred to a the ICU urgently and emergently intubated. Patient was supposed to have a upper endoscopy for dysphagia tomorrow by Dr. Marva Panda    SIGNIFICANT EVENTS  9/27>> admitted for dehydration, dysphagia, weakness, lethargy 9/29>> respiratory distress transferred to the ICU, emergently intubated      PAST MEDICAL HISTORY    :  Past Medical History  Diagnosis Date  . Hypertension   . Hypercholesteremia   . Stroke    Past Surgical History  Procedure Laterality Date  . Tonsillectomy    . Femur im nail Left 11/01/2014     Procedure: INTRAMEDULLARY (IM) NAIL FEMORAL;  Surgeon: Kennedy Bucker, MD;  Location: [facility] ORS;  Service: Orthopedics;  Laterality: Left;   Prior to Admission medications   Medication Sig Start Date End Date Taking? Authorizing Kamoria Lucien  alendronate (FOSAMAX) 70 MG tablet Take 70 mg by mouth once a week. Take with a full glass of water on an empty stomach.   Yes Historical Addis Bennie, MD  ALPRAZolam (XANAX) 0.25 MG tablet Take 0.25 mg by mouth 2 (two) times daily as needed for anxiety or sleep.   Yes Historical Meila Berke, MD  amLODipine (NORVASC) 2.5 MG tablet Take 2.5 mg by mouth daily.   Yes Historical Arlana Canizales, MD  aspirin EC 81 MG tablet Take 81 mg by mouth daily.   Yes Historical Aaliayah Miao, MD  Calcium Carbonate-Vitamin D (CALCIUM 600+D) 600-400 MG-UNIT tablet Take 1 tablet by mouth daily.   Yes Historical Graciemae Delisle, MD  docusate sodium (COLACE) 100 MG capsule Take 100 mg by mouth daily as needed for mild constipation.   Yes Historical Paulena Servais, MD  fluticasone (FLONASE) 50 MCG/ACT nasal spray Place 2 sprays into both nostrils daily. 02/28/15 02/28/16 Yes Tommi Rumps, PA-C  hydrochlorothiazide (MICROZIDE) 12.5 MG capsule Take 12.5 mg by mouth daily.   Yes Historical Siraj Dermody, MD  HYDROcodone-acetaminophen (NORCO/VICODIN) 5-325 MG per tablet Take 1-2 tablets by mouth every 6 (six) hours as needed for moderate pain. 11/04/14  Yes Evon Slack, PA-C  latanoprost (XALATAN) 0.005 % ophthalmic solution Place 1 drop into the left eye at bedtime.   Yes Historical Gunner Iodice, MD  polyethylene glycol (MIRALAX / GLYCOLAX) packet Take 17 g by mouth daily as needed for mild constipation or moderate constipation. 11/05/14  Yes Vishwanath Hande, MD  simvastatin (ZOCOR) 20 MG tablet Take 20 mg by mouth at bedtime.   Yes Historical Shakeria Robinette, MD   No Known Allergies   FAMILY HISTORY   Family History  Problem Relation Age of Onset  . Arthritis Mother   . Arthritis Father       SOCIAL HISTORY     reports that she has never smoked. She does not have any smokeless tobacco history on file. She reports that she does not drink alcohol or use illicit drugs.  Review of Systems  Unable to perform ROS: critical illness      VITAL SIGNS    Temp:  [97.1 F (36.2 C)-98 F (36.7 C)] 97.1 F (36.2 C) (09/29 1007) Pulse Rate:  [73-87] 83 (09/29 1100) Resp:  [14-19] 14 (09/29 1100) BP: (71-154)/(45-75) 71/45 mmHg (09/29 1100) SpO2:  [95 %-98 %] 95 % (09/29 1100) HEMODYNAMICS:   VENTILATOR SETTINGS:   INTAKE / OUTPUT:  Intake/Output Summary (Last 24 hours) at 03/11/15 1200 Last data filed at 03/10/15 2035  Gross per 24 hour  Intake  388.9 ml  Output    200 ml  Net  188.9 ml       PHYSICAL EXAM   Physical Exam  Constitutional: She appears distressed.  HENT:  Head: Normocephalic.  Eyes: Pupils are equal, round, and reactive to light.  Neck: Normal range of motion.  Cardiovascular: Normal heart sounds and intact distal pulses.   tachycardia  Pulmonary/Chest:  Coarse upper airway sounds, shallow, tachypnea, emergently intubated.  Post intubation - coarse upper airway BS, equal BS bilaterally, no crackles.   Abdominal: Soft. She exhibits no distension. There is no tenderness.  Genitourinary: Vagina normal.  Musculoskeletal: Normal range of motion.  Neurological:  Intubated and sedated.   Skin: Skin is warm and dry.  Nursing note and vitals reviewed.      LABS   LABS:  CBC  Recent Labs Lab 03/09/15 1035 03/10/15 0611  WBC 5.5 6.1  HGB 14.7 14.2  HCT 43.8 43.1  PLT 248 257   Coag's  Recent Labs Lab 03/10/15 1420  INR 1.05   BMET  Recent Labs Lab 03/09/15 1035 03/10/15 0611  NA 143 142  K 3.7 4.0  CL 105 108  CO2 29 21*  BUN 20 17  CREATININE 1.06* 0.79  GLUCOSE 96 74   Electrolytes  Recent Labs Lab 03/09/15 1035 03/10/15 0611  CALCIUM 9.5 9.2   Sepsis Markers No results for input(s): LATICACIDVEN, PROCALCITON, O2SATVEN in the  last 168 hours. ABG No results for input(s): PHART, PCO2ART, PO2ART in the last 168 hours. Liver Enzymes No results for input(s): AST, ALT, ALKPHOS, BILITOT, ALBUMIN in the last 168 hours. Cardiac Enzymes  Recent Labs Lab 03/09/15 1035  TROPONINI <0.03   Glucose  Recent Labs Lab 03/11/15 1034  GLUCAP 80     No results found for this or any previous visit (from the past 240 hour(s)).   Current facility-administered medications:  .  0.9 %  sodium chloride infusion, , Intravenous, Continuous, Katha Hamming, MD, Last Rate: 40 mL/hr at 03/10/15 1944 .  ALPRAZolam Prudy Feeler) tablet 0.25 mg, 0.25 mg, Oral, QHS PRN, Gale Journey, MD, 0.25 mg at 03/10/15 1942 .  antiseptic oral rinse solution (CORINZ), 7 mL, Mouth Rinse, QID, Vishal Mungal, MD .  aspirin suppository 300 mg, 300 mg, Rectal,  Daily, Leotis Shames, MD, 300 mg at 03/10/15 1537 .  chlorhexidine gluconate (PERIDEX) 0.12 % solution 15 mL, 15 mL, Mouth Rinse, BID, Vishal Mungal, MD .  docusate (COLACE) 50 MG/5ML liquid 100 mg, 100 mg, Per Tube, BID PRN, Vishal Mungal, MD .  enoxaparin (LOVENOX) injection 40 mg, 40 mg, Subcutaneous, Q24H, Katha Hamming, MD, 40 mg at 03/10/15 1536 .  fentaNYL (SUBLIMAZE) bolus via infusion 25 mcg, 25 mcg, Intravenous, Q1H PRN, Vishal Mungal, MD .  fentaNYL (SUBLIMAZE) injection 50 mcg, 50 mcg, Intravenous, Once, Vishal Mungal, MD .  fentaNYL in NS (60mcg/ml) infusion-PREMIX, 25-400 mcg/hr, Intravenous, Continuous, Vishal Mungal, MD .  fluticasone (FLONASE) 50 MCG/ACT nasal spray 2 spray, 2 spray, Each Nare, Daily, Katha Hamming, MD, 2 spray at 03/10/15 1129 .  ipratropium-albuterol (DUONEB) 0.5-2.5 (3) MG/3ML nebulizer solution 3 mL, 3 mL, Nebulization, Q6H, Vishal Mungal, MD .  midazolam (VERSED) injection 1 mg, 1 mg, Intravenous, Q15 min PRN, Vishal Mungal, MD .  midazolam (VERSED) injection 1 mg, 1 mg, Intravenous, Q2H PRN, Vishal Mungal, MD .  ondansetron  (ZOFRAN) tablet 4 mg, 4 mg, Oral, Q6H PRN **OR** ondansetron (ZOFRAN) injection 4 mg, 4 mg, Intravenous, Q6H PRN, Katha Hamming, MD .  pantoprazole (PROTONIX) injection 40 mg, 40 mg, Intravenous, Q12H, Christena Deem, MD, 40 mg at 03/11/15 1005 .  pravastatin (PRAVACHOL) tablet 10 mg, 10 mg, Oral, Daily, Leotis Shames, MD .  pyridostigmine (MESTINON) injection 2 mg, 2 mg, Intravenous, 3 times per day, Mellody Drown, MD, 2 mg at 03/10/15 2128  IMAGING    No results found.    Indwelling Urinary Catheter continued, requirement due to   Reason to continue Indwelling Urinary Catheter for strict Intake/Output monitoring for hemodynamic instability   Central Line continued, requirement due to   Reason to continue [employer] Monitoring of central venous pressure or other hemodynamic parameters   Ventilator continued, requirement due to, resp failure    Ventilator Sedation RASS 0 to -2      ASSESSMENT/PLAN  81 year old female with dysphagia, dehydration, malnutrition, transferred to the ICU for acute respiratory distress, concern for possible aspiration.  PULMONARY OETT 7.26mm A: Acute respiratory failure Hypoxia Ventilator-dependent  P:   - Acute respiratory failure with hypoxia, concern for aspiration, may initiate bronchoscopy later today. -Continue mechanical ventilation, wean as tolerated, check ABG as needed. -Check chest x-ray -When necessary DuoNeb's -MAP >65 -Empiric antibiotics  CARDIOVASCULAR CVL - R Fem A:  P:  - tele monitoring - check EKG - CE x 3  RENAL A:   Dehydration  P:   - cont with IVF - cont with electrolyte monitoring.   GASTROINTESTINAL A:  Dysphagia P:   - GI following, plans for upper endoscopy  - PPI  HEMATOLOGIC   INFECTIOUS A:   Sepsis ? Aspiration Pneumonia P:   Thick secretions noted during the intubation Acute respiratory distress and failure with subsequent dysphagia and difficultly swallowing, concern for  aspiration  Lines: R Fem CVL 9/29  Cultures BCx2 9/29  Antibiotics Abx: Vanc/Zosyn, start date 9/29  ENDOCRINE - ICU hypo/hyperglycemia protocol  NEUROLOGIC Intubated and sedated P:   RASS goal: -1 - cont with fentanyl and versed for now  Social  - Patient lives next door, who is caretaker. -Patient has 2 daughters who are the health care power of attorney, currently she is a full code  I have personally obtained a history, examined the patient, evaluated laboratory and imaging results, formulated the assessment and plan and  placed orders.  The Patient requires high complexity decision making for assessment and support, frequent evaluation and titration of therapies, application of advanced monitoring technologies and extensive interpretation of multiple databases. Critical Care Time devoted to patient care services described in this note is 45 minutes.   Overall, patient is critically ill, prognosis is guarded. Patient at high risk for cardiac arrest and death.   Stephanie Acre, MD [facility] Pager [phone] (please enter 7-digits) On Call Pager - [phone] (please enter 7-digits)     03/11/2015, 12:00 PM

## 2015-03-11 NOTE — Progress Notes (Signed)
Speech Therapy Note: reviewed chart notes which indicate that pt had a rapid response called and has now been transferred to CCU and orally intubated sec. to decline in respiratory status. ST will f/u when pt's status improves; when medically indicated. Noted continued f/u by GI(large hiatal hernia) as well per notes.

## 2015-03-11 NOTE — Procedures (Signed)
Procedure Note:  Orotracheal Intubation  Implied consent due to emergent nature of patient's condition. Correct Patient, Name & ID confirmed.  The patient was pre-oxygenated and then, under direct visualization, a 7.5 mm cuffed endotracheal tube was placed through the vocal cords into the trachea, using the Glidescope.  Total attempts made 1, using Glidescope, thick white secretions noted during the intubation.  During intubation an assistant applied gentle pressure to the cricoid cartilage.  Position confirmed by auscultation of lungs (good breath sounds bilaterally) and no stomach sounds.  Tube secured at 24 cm. Pulse ox 98 %. CO2 detector in place with appropriate color change.   Pt tolerated procedure well.  No complications were noted.   CXR ordered.   Stephanie Acre, MD [facility] Pager [phone] On Call Pager [phone]

## 2015-03-11 NOTE — Progress Notes (Signed)
Speech Therapy Note: received order, consulted NSG, reviewed chart notes noting pt was agitated during the night and received multiple meds for agitation. NSG reported pt was resting now and only arousing minimally. Rec. ST f/u for BSE later today d/t lethargy at this time. NSG agreed. ST will f/u in PM.

## 2015-03-11 NOTE — Progress Notes (Addendum)
Pt confused, pulling telemetry off, trying to get out bed, pulling at IV, hitting CNA's when they try to reorient her.  Applied mittens and .5 of ativan pr Dr.Willis.  Pt pulled mittens off and telemetry and o2 sensor.  Dr.  Jovita Gamma another .5 ativan and reapplied mittens.  CNA in room with pt at this time, will cont to monitor. Patient still continues with same behaviors moments after second dose of ativan was given. Dr. Anne Hahn notified and gave new order for  of Haldol. Patient went to sleep at 0330 and has been resting well since.

## 2015-03-11 NOTE — Progress Notes (Signed)
Subjective: Patient was intubated overnight and transferred to ICU. Intubated on mechanical ventilation. Currently on vasopressors. Daughter at bedside.   Objective: Vital signs in last 24 hours: Temp:  [97.1 F (36.2 C)-98 F (36.7 C)] 97.1 F (36.2 C) (09/29 1007) Pulse Rate:  [73-83] 83 (09/29 1100) Resp:  [14-18] 14 (09/29 1300) BP: (71-167)/(45-72) 157/70 mmHg (09/29 1300) SpO2:  [95 %-97 %] 95 % (09/29 1100) FiO2 (%):  [40 %] 40 % (09/29 1230) Weight change:     Intake/Output from previous day: 09/28 0701 - 09/29 0700 In: 468.9 [I.V.:468.9] Out: 200 [Urine:200] Intake/Output this shift:    GENERAL:Sedated on mechanical ventilation. EYES: No pallor, no icterus. HEENT:  Endotracheal tube in place. Head atraumatic, normocephalic.  NECK: supple, no jugular venous distention. No thyroid enlargement, no tenderness.  LUNGS: clear to auscultation. No wheezing, rhonchi or crepitation. Good air entry bilaterally. CARDIOVASCULAR: regular rate and rhythm, S1, S2 normal. No tachycardia.  ABDOMEN: Soft, non-tender, normal bowel sounds, no organomegaly or mass.  EXTREMITIES: No pedal edema, cyanosis, or clubbing.  NEUROLOGIC: Sedated, unable to assess.  Lab Results:  Recent Labs  03/09/15 1035 03/10/15 0611  WBC 5.5 6.1  HGB 14.7 14.2  HCT 43.8 43.1  PLT 248 257   BMET  Recent Labs  03/09/15 1035 03/10/15 0611  NA 143 142  K 3.7 4.0  CL 105 108  CO2 29 21*  GLUCOSE 96 74  BUN 20 17  CREATININE 1.06* 0.79  CALCIUM 9.5 9.2    Studies/Results: Mr Brain Wo Contrast  03/10/2015   CLINICAL DATA:  81 year old female with left eye blurred vision and left eye drooping. Generalized weakness and difficulty swallowing for 2 days. Decreased p.o. intake. Altered mental status. Initial encounter.  EXAM: MRI HEAD WITHOUT CONTRAST  TECHNIQUE: Multiplanar, multiecho pulse sequences of the brain and surrounding structures were obtained without intravenous contrast.   COMPARISON:  Neck CT and head CT 03/09/2015.  Brain MRI 12/18/2013.  FINDINGS: Cerebral volume is stable since 2015. Major intracranial vascular flow voids are stable. No restricted diffusion or evidence of acute infarction.  Chronic lacunar type infarcts in both cerebellar hemispheres, an the right corona radiata. Stable mild to moderate for age T2 heterogeneity in the deep gray matter nuclei and cerebral white matter elsewhere. Probable normal sulcus anatomic variation in the right parietal lobe again noted (series 10, image 20) or less likely small arachnoid cyst.  No midline shift, mass effect, evidence of mass lesion, ventriculomegaly, or acute intracranial hemorrhage. Cervicomedullary junction and pituitary are within normal limits. Stable right mastoid effusion. Stable paranasal sinuses since 2015. Interval right globe postoperative changes. Other orbit and scalp soft tissues are stable. Normal bone marrow signal.  There is chronic cervical spine degeneration, with at least mild spinal stenosis at C3-C4 and C4-C5. This does not appear significantly progressed since 2015.  IMPRESSION: 1. No acute intracranial abnormality. Chronic small vessel ischemic disease is stable since 2015. 2. Chronic cervical spine degeneration with C3-C4 and C4-C5 spinal stenosis which does not appear significantly progressed since 2015.   Electronically Signed   By: Odessa Fleming M.D.   On: 03/10/2015 09:57   US Carotid Bilateral  03/10/2015   CLINICAL DATA:  Dysphagia. Remote stroke. Syncope, visual disturbance, hyperlipidemia. Remote endarterectomy 15 years ago, laterality not specified.  EXAM: BILATERAL CAROTID DUPLEX ULTRASOUND  TECHNIQUE: Wallace Cullens scale imaging, color Doppler and duplex ultrasound was performed of bilateral carotid and vertebral arteries in the neck.  COMPARISON:  CT 03/09/2015, ultrasound 12/18/2013  REVIEW OF SYSTEMS: Quantification of carotid stenosis is based on velocity parameters that correlate the residual  internal carotid diameter with NASCET-based stenosis levels, using the diameter of the distal internal carotid lumen as the denominator for stenosis measurement.  The following velocity measurements were obtained:  PEAK SYSTOLIC/END DIASTOLIC  RIGHT  ICA:                     149/11cm/sec  CCA:                     84/12cm/sec  SYSTOLIC ICA/CCA RATIO:  1.8  DIASTOLIC ICA/CCA RATIO: 0.9  ECA:                     84cm/sec  LEFT  ICA:                     82/21cm/sec  CCA:                     121/13cm/sec  SYSTOLIC ICA/CCA RATIO:  0.7  DIASTOLIC ICA/CCA RATIO: 1.6  ECA:                     80cm/sec  FINDINGS: RIGHT CAROTID ARTERY: Eccentric partially calcified plaque effaces the bulb and extends into the proximal ICA resulting in at least mild stenosis. There mildly elevated peak systolic velocities at the level of plaque in the proximal ICA. Elsewhere normal waveforms and color Doppler signal.  RIGHT VERTEBRAL ARTERY:  Normal flow direction and waveform.  LEFT CAROTID ARTERY: Mild intimal thickening. No focal plaque or stenosis. Normal waveforms and color Doppler signal.  LEFT VERTEBRAL ARTERY: Normal flow direction and waveform.  IMPRESSION: 1. Right carotid bifurcation and proximal ICA plaque, resulting in less than 50% diameter stenosis. The exam does not exclude plaque ulceration or embolization. Continued surveillance recommended. 2. No significant left carotid plaque or stenosis.   Electronically Signed   By: Corlis Leak M.D.   On: 03/10/2015 13:36   Portable Chest Xray  03/11/2015   CLINICAL DATA:  Respiratory failure.  Intubation  EXAM: PORTABLE CHEST 1 VIEW  COMPARISON:  03/09/2015  FINDINGS: Endotracheal tube enters the right main bronchus. Recommend withdrawal 3 of 4 cm.  The right lung is hyperinflated. There is collapse in the left lower lobe.  NG tube coiled in the hiatal hernia above the diaphragm.  Negative for heart failure.  IMPRESSION: Endotracheal tube right main bronchus, recommend withdrawal of  3-4 cm.  Collapse of left lower lobe  NG tube coiled in the hiatal hernia  These results were called by telephone at the time of interpretation on 03/11/2015 at 12:46 pm to [facility], RN , who verbally acknowledged these results.   Electronically Signed   By: Marlan Palau M.D.   On: 03/11/2015 12:47    Medications:  Scheduled Meds: . antiseptic oral rinse  7 mL Mouth Rinse QID  . aspirin  300 mg Rectal Daily  . chlorhexidine gluconate  15 mL Mouth Rinse BID  . enoxaparin (LOVENOX) injection  40 mg Subcutaneous Q24H  . fentaNYL (SUBLIMAZE) injection  50 mcg Intravenous Once  . fluticasone  2 spray Each Nare Daily  . ipratropium-albuterol  3 mL Nebulization Q6H  . pantoprazole (PROTONIX) IV  40 mg Intravenous Q12H  . piperacillin-tazobactam (ZOSYN)  IV  3.375 g Intravenous 3 times per day  . pravastatin  10 mg Oral Daily  . pyridostigmine  2 mg  Intravenous 3 times per day  . vancomycin  500 mg Intravenous STAT  . vancomycin  500 mg Intravenous Q18H   Continuous Infusions: . sodium chloride 40 mL/hr at 03/10/15 1944  . fentaNYL infusion INTRAVENOUS     PRN Meds:.ALPRAZolam, docusate, fentaNYL, midazolam, midazolam, ondansetron **OR** ondansetron (ZOFRAN) IV   Assessment/Plan: 81 year old lady admitted for fluctuating dysphagia, difficulty speaking and left eye drooping.  Patient was evaluated by neurology and started on Mestinon for possible Myasthenia gravis. Neurologic work up in progress. No acute intracranial abnormality on initial CT head in the emergency department. MRI was negative for acute findings. She was intubated overnight for respiratory distress and hypoxia. Patient was hypotensive requiring vasopressor support. She was started on empiric broad-spectrum antibiotic vancomycin and Zosyn. Carotid Doppler revealed right carotid bifurcation and proximal internal carotid artery plaque resulting in less than 50% stenosis. Echocardiogram is pending. Shall continue Aspirin and pravastatin.  She is closely followed by intensivist and neurology.  Dysphagia of solids and liquids for 2 weeks, closely followed by GI.  DVT prophylaxis with Lovenox.  GI prophylaxis with IV Protonix.   LOS: 2 days   Singh,Jasmine 03/11/2015, 1:25 PM

## 2015-03-12 DIAGNOSIS — J9600 Acute respiratory failure, unspecified whether with hypoxia or hypercapnia: Secondary | ICD-10-CM

## 2015-03-12 DIAGNOSIS — R1310 Dysphagia, unspecified: Secondary | ICD-10-CM

## 2015-03-12 DIAGNOSIS — J9601 Acute respiratory failure with hypoxia: Secondary | ICD-10-CM

## 2015-03-12 LAB — TROPONIN I: Troponin I: <0.03 ng/mL (ref <0.031)

## 2015-03-12 LAB — MRSA PCR SCREENING

## 2015-03-12 LAB — GLUCOSE, CAPILLARY
GLUCOSE-CAPILLARY: 187 mg/dL — AB (ref 65–99)
Glucose-Capillary: 162 mg/dL — ABNORMAL HIGH (ref 65–99)
Glucose-Capillary: 180 mg/dL — ABNORMAL HIGH (ref 65–99)

## 2015-03-12 LAB — CKMB (ARMC ONLY)
CK, MB: 4.5 ng/mL (ref 0.5–5.0)
CK, MB: 6.7 ng/mL — AB (ref 0.5–5.0)

## 2015-03-12 LAB — PROCALCITONIN: PROCALCITONIN: 0.47 ng/mL

## 2015-03-12 MED ORDER — FREE WATER
200.0000 mL | Freq: Three times a day (TID) | Status: DC
  Administered 2015-03-12 (×2): 200 mL

## 2015-03-12 MED ORDER — VITAL HIGH PROTEIN PO LIQD
1000.0000 mL | ORAL | Status: DC
Start: 2015-03-12 — End: 2015-03-13
  Administered 2015-03-12: 1000 mL

## 2015-03-12 MED ORDER — INSULIN ASPART 100 UNIT/ML ~~LOC~~ SOLN
0.0000 [IU] | SUBCUTANEOUS | Status: DC
  Administered 2015-03-14 – 2015-03-15 (×3): 1 [IU] via SUBCUTANEOUS
  Filled 2015-03-12 (×3): qty 1
  Filled 2015-03-12: qty 2

## 2015-03-12 MED ORDER — ENOXAPARIN SODIUM 30 MG/0.3ML ~~LOC~~ SOLN
30.0000 mg | SUBCUTANEOUS | Status: DC
  Administered 2015-03-12 – 2015-03-15 (×4): 30 mg via SUBCUTANEOUS
  Filled 2015-03-12 (×4): qty 0.3

## 2015-03-12 MED ORDER — FAMOTIDINE 40 MG/5ML PO SUSR
20.0000 mg | Freq: Two times a day (BID) | ORAL | Status: DC
  Administered 2015-03-12 (×2): 20 mg
  Filled 2015-03-12 (×6): qty 2.5

## 2015-03-12 NOTE — Clinical Documentation Improvement (Signed)
Hospitalist  Can the diagnosis of systemic infection be further specified? Please specify if Sepsis was present on admission.   Sepsis - specify causative organism if known  Determine if there is Severe Sepsis (Sepsis with organ dysfunction - specify), Septic Shock if present  Identify etiology of Sepsis - Device, Implant, Graft, Infusion, Abortion  Specify organ dysfunction - Respiratory Failure, Encephalopathy, Acute Kidney Failure, Pneumonia, UTI, Other (specify), Unable to Clinically Determine}  Other  Clinically Undetermined  Document any associated diagnoses/conditions.   Supporting Information: Sepsis noted per 9/30 progress notes. Please specify if Sepsis was present on admission.  Please exercise your independent, professional judgment when responding. A specific answer is not anticipated or expected.   Thank Sabino Donovan [employer] [facility] [place] [phone]

## 2015-03-12 NOTE — Progress Notes (Signed)
   03/12/15 1527  Clinical Encounter Type  Visited With Patient and family together  Visit Type Spiritual support  Consult/Referral To Chaplain  Spiritual Encounters  Spiritual Needs Emotional  Chaplain rounded in the unit and checked in with family. Daughter reminded me that I had prayed for her mom prior to surgery in the PACU previously. I offered a compassionate presence and support. Chaplain Sonya A. Laws Ext. [phone]

## 2015-03-12 NOTE — Progress Notes (Signed)
NEUROLOGY NOTE  S: Pt had NG tube placed and now much more alert today;  Failed one weaning trial earlier  ROS unobtainable to intubation  O: 99.5  96/58   58  14 Mild distress, nl weight Normocephalic, oropharynx clear Supple, no JVD Coarse BS, decreased movement RRR, no murmur No C/C/E   Intubated, lightly sedated, opens and follows quickly PERRLA, +corneals good, good cough 5-/5 B strength, nl tone  A/P: 1. Probable myasthenia gravis- Pt has had increased strength and resolution or L ptosis after mestinon 2. Encephalopathy- resolved - Continue Mestinon  TID PO - Continue Prednisone  daily PO - Avoid flouroquinolones and imipenums when treating aspiration because these worsen MG - Labs are pending - Continue respiratory support and check FVCs prior to extubation - Will follow closely  -  Would hold off on transfer unless pt can not get extubated;  If breathing is still weak, would then perform plasma exchange which will have to be done at outside hospital  34 minutes of critical care time spent, examining pt, reviewing chart and coordinating care

## 2015-03-12 NOTE — Progress Notes (Signed)
   03/12/15 1100  Clinical Encounter Type  Visited With Patient  Visit Type Spiritual support  Consult/Referral To Chaplain  Spiritual Encounters  Spiritual Needs Prayer;Emotional  Stress Factors  Patient Stress Factors None identified  Chaplain rounded in the unit and offered a compassionate presence and prayer. Talked to the patient who opened her eyes while I spoke. Chaplain Sonya A. Laws Ext. [phone]

## 2015-03-12 NOTE — Care Management Note (Signed)
Case Management Note  Patient Details  Name: Cheyenne Boone MRN: [id number] Date of Birth: 10-11-1923  Subjective/Objective:  Admitted with dysphagia. It is thought that patient may have a new diagnosis of myasthenia gravis. Prior to admission patient was living at home alone with her daughter living next door to assist as needed. Currently intubated. PT recommending SNF. CSW following.                 Action/Plan: Will assist as needed with progression.    Expected Discharge Date:                  Expected Discharge Plan:  Skilled Nursing Facility  In-House Referral:  Clinical Social Work  Discharge planning Services  CM Consult  Post Acute Care Choice:    Choice offered to:     DME Arranged:    DME Agency:     HH Arranged:    HH Agency:     Status of Service:  In process, will continue to follow  Medicare Important Message Given:  Yes-second notification given Date Medicare IM Given:    Medicare IM give by:    Date Additional Medicare IM Given:    Additional Medicare Important Message give by:     If discussed at Long Length of Stay Meetings, dates discussed:    Additional Comments:  Marily Memos, RN 03/12/2015, 11:57 AM

## 2015-03-12 NOTE — Progress Notes (Signed)
Nutrition Follow-up    INTERVENTION:   EN: recommend starting Vital High Protein at rate of 20 ml/hr with goal rate of 42 ml/hr providing 1008 kcals, 89 g of protein, 847 mL of free water. Continue to assess Coordination of Care: if pt extubated, recommend dobhoff remain in place for nutrition, at least until EGD able to be performed   NUTRITION DIAGNOSIS:   Inadequate oral intake related to dysphagia, acute illness as evidenced by NPO status, per patient/family report. Being addressed via TF  GOAL:   Provide needs based on ASPEN/SCCM guidelines  MONITOR:    (Energy Intake, Anthropometrics, Electrolyte/Renal Profile, Digestive System, Pulmonary)  REASON FOR ASSESSMENT:   Malnutrition Screening Tool, Ventilator, Diagnosis    ASSESSMENT:    Pt remains on vent  Diet Order:  Diet NPO time specified  Digestive System: dobhoff tube placed yesterday by radiology under fleuro, location in stomach, abdomen soft  Electrolyte and Renal Profile:  Recent Labs Lab 03/09/15 1035 03/10/15 0611 03/11/15 1615  BUN CREATININE 1.06* 0.79 1.00  NA 143 142 135  K 3.7 4.0 3.1*   Glucose Profile:  Recent Labs  03/11/15 1034 03/11/15 1046  GLUCAP 80 86   Meds: NS at 40 ml/hr, levophed  Height:   Ht Readings from Last 1 Encounters:  03/09/15  (1.499 m)    Weight:   Wt Readings from Last 1 Encounters:  03/09/15 106 lb (48.081 kg)    BMI:  Body mass index is 21.4 kg/(m^2).  Estimated Nutritional Needs:   Kcal:  852 kcals (BEE 817, Ve: 5.4, Tmax: 36.6) but will aim to provide at least 1000 kcals per day  Protein:  72-96 g (1.5-2.0 g/kg)   Fluid:  1440-1680 mL (30-35 ml/kg)   HIGH Care Level  Romelle Starcher MS, RD, LDN [phone] Pager

## 2015-03-12 NOTE — Progress Notes (Signed)
[facility] Critical Care Medicine Progess Note    ASSESSMENT/PLAN   81 year old female with dysphagia, dehydration, malnutrition, transferred to the ICU for acute respiratory distress, concern for possible aspiration. Concern for possible myasthenia gravis, currently on Mestinon.  PULMONARY OETT 7.39mm A: Acute hypoxic respiratory failure Ventilator-dependent Copious mucosal secretions, status post bronchoscopy.  P:  - Acute respiratory failure with hypoxia, concern for aspiration, may initiate bronchoscopy later today. -Continue mechanical ventilation, wean as tolerated, given the patient's possible myasthenia gravis. Will wean carefully after checking a weaning trial as well as maximal inspiratory and expiratory pressures. -Check chest x-ray -When necessary DuoNeb's -MAP >65 -Empiric antibiotics -Await bronchoscopy culture results  CARDIOVASCULAR CVL - R Fem A:  P:  - tele monitoring - check EKG - CE x 3  RENAL A:  Dehydration  P:  - cont with IVF - cont with electrolyte monitoring.   GASTROINTESTINAL A:  Dysphagia P:  - GI following, plans for upper endoscopy  - PPI, will change to H2 blocker.  HEMATOLOGIC Leukocytosis. -Likely secondary to steroids, currently at 40 mg once daily. -Continue enoxaparin for DVT prophylaxis.  INFECTIOUS A:  Sepsis ? Aspiration Pneumonia P:  Thick secretions noted during the intubation Acute respiratory distress and failure with subsequent dysphagia and difficultly swallowing, concern for aspiration. -Continue vancomycin and Zosyn. Await bronchoscopy cultures.  Lines: R Fem CVL 9/29  Cultures BCx2 9/29  Antibiotics Abx: Vanc/Zosyn, start date 9/29  ENDOCRINE - ICU hypo/hyperglycemia protocol  NEUROLOGIC Intubated and sedated Possible myasthenia gravis. Neurology following. Currently on pyridostigmine 60 mg every 8 hours. P:  RASS goal: -1 - cont with fentanyl and versed for  now  Social  - Patient lives next door, who is caretaker. -Patient has 2 daughters who are the health care power of attorney, currently she is a full code  ---------------------------------------   ----------------------------------------   Name: SONJIA WILCOXSON MRN: [id number] DOB: April 12, 1924    ADMISSION DATE:  03/09/2015    CHIEF COMPLAINT:  Dyspnea    SUBJECTIVE:  Patient is currently on the ventilator, she is awake, responds to questions and can follow commands.   Review of Systems:  Pt currently on the ventilator, can not provide history or review of systems.     VITAL SIGNS: Temp:  [95.7 F (35.4 C)-99.7 F (37.6 C)] 99.5 F (37.5 C) (09/30 1200) Pulse Rate:  [52-122] 58 (09/30 1200) Resp:  [12-28] 14 (09/30 1200) BP: (96-163)/(51-87) 96/58 mmHg (09/30 1200) SpO2:  [100 %] 100 % (09/30 1200) FiO2 (%):  [30 %-40 %] 30 % (09/30 1100) HEMODYNAMICS:   VENTILATOR SETTINGS: Vent Mode:  [-] PRVC FiO2 (%):  [30 %-40 %] 30 % Set Rate:  [14 bmp] 14 bmp Vt Set:  [400 mL] 400 mL PEEP:  [5 cmH20] 5 cmH20 Pressure Support:  [10 cmH20] 10 cmH20 INTAKE / OUTPUT:  Intake/Output Summary (Last 24 hours) at 03/12/15 1401 Last data filed at 03/12/15 1100  Gross per 24 hour  Intake 1965.55 ml  Output    750 ml  Net 1215.55 ml    PHYSICAL EXAMINATION: Physical Examination:   VS: BP 96/58 mmHg  Pulse 58  Temp(Src) 99.5 F (37.5 C) (Core (Comment))  Resp 14  Ht  (1.499 m)  Wt 48.081 kg (106 lb)  BMI 21.40 kg/m2  SpO2 100%  General Appearance: No distress  Neuro:without focal findings, mental status normal. HEENT: PERRLA, EOM intact. Pulmonary: normal breath sounds   CardiovascularNormal S1,S2.  No m/r/g.   Abdomen: Benign,  Soft, non-tender. Renal:  No costovertebral tenderness  GU:  Not performed at this time. Endocrine: No evident thyromegaly. Skin:   warm, no rashes, no ecchymosis  Extremities: normal, no cyanosis,  clubbing.   LABS:   LABORATORY PANEL:   CBC  Recent Labs Lab 03/11/15 1615  WBC 21.2*  HGB 12.8  HCT 39.4  PLT 285    Chemistries   Recent Labs Lab 03/11/15 1615  NA 135  K 3.1*  CL 105  CO2 17*  GLUCOSE 228*  BUN 19  CREATININE 1.00  CALCIUM 8.1*  AST 25  ALT 12*  ALKPHOS 94  BILITOT 1.6*     Recent Labs Lab 03/11/15 1034 03/11/15 1046  GLUCAP 80 86    Recent Labs Lab 03/11/15 1255  PHART 7.28*  PCO2ART 38  PO2ART 95    Recent Labs Lab 03/11/15 1615  AST 25  ALT 12*  ALKPHOS 94  BILITOT 1.6*  ALBUMIN 3.5    Cardiac Enzymes  Recent Labs Lab 03/12/15 0520  TROPONINI <0.03    RADIOLOGY:  Dg Fluoro Rm 1-60 Min  03/11/2015   CLINICAL DATA:  Orogastric tube placement. Unsuccessful attempt dad orogastric tube placement at bedside.  EXAM: FLOURO RM 1-60 MIN  FLUOROSCOPY TIME:  Radiation Exposure Index (as provided by the fluoroscopic device): 3.5 mGy  COMPARISON:  None.  FINDINGS: A Dobbhoff tube was inserted through the right nare into the esophagus with the metallic tip projecting over the fundus of the stomach. The tube was secured to the nose.  IMPRESSION: Dobbhoff tube with the tip projecting over the fundus of the stomach.   Electronically Signed   By: Elige Ko   On: 03/11/2015 17:05   Portable Chest Xray  03/11/2015   CLINICAL DATA:  Respiratory failure.  Intubation  EXAM: PORTABLE CHEST 1 VIEW  COMPARISON:  03/09/2015  FINDINGS: Endotracheal tube enters the right main bronchus. Recommend withdrawal 3 of 4 cm.  The right lung is hyperinflated. There is collapse in the left lower lobe.  NG tube coiled in the hiatal hernia above the diaphragm.  Negative for heart failure.  IMPRESSION: Endotracheal tube right main bronchus, recommend withdrawal of 3-4 cm.  Collapse of left lower lobe  NG tube coiled in the hiatal hernia  These results were called by telephone at the time of interpretation on 03/11/2015 at 12:46 pm to [facility], RN , who  verbally acknowledged these results.   Electronically Signed   By: Marlan Palau M.D.   On: 03/11/2015 12:47       --Wells Guiles, MD.  Pager [phone] [facility] Office Number: [phone]  Critical Care Attestation.  I have personally obtained a history, examined the patient, evaluated laboratory and imaging results, formulated the assessment and plan and placed orders. The Patient requires high complexity decision making for assessment and support, frequent evaluation and titration of therapies, application of advanced monitoring technologies and extensive interpretation of multiple databases. The patient has critical illness that could lead imminently to failure of 1 or more organ systems and requires the highest level of physician preparedness to intervene.  Critical Care Time devoted to patient care services described in this note is 35 minutes and is exclusive of time spent in procedures.   Santiago Glad, M.D.  Stephanie Acre, M.D.  Billy Fischer, M.D

## 2015-03-12 NOTE — Progress Notes (Signed)
Pharmacy Consult for Vancomycin/Zosyn Indication: Sepsis/possible aspiration PNA  No Known Allergies  Patient Measurements: Height:  (149.9 cm) Weight: 106 lb (48.081 kg) IBW/kg (Calculated) : 43.2   Vital Signs: Temp: 99.5 F (37.5 C) (09/30 1200) Temp Source: Core (Comment) (09/30 0738) BP: 96/58 mmHg (09/30 1200) Pulse Rate: 58 (09/30 1200) Intake/Output from previous day: 09/29 0701 - 09/30 0700 In: 2070.3 [I.V.:1720.3; IV Piggyback:350] Out: 400 [Urine:400] Intake/Output from this shift: Total I/O In: 63.8 [I.V.:63.8] Out: 350 [Urine:350]  Labs:  Recent Labs  03/10/15 0611 03/11/15 1615  WBC 6.1 21.2*  HGB 14.2 12.8  PLT 257 285  CREATININE 0.79 1.00   Estimated Creatinine Clearance: 26.5 mL/min (by C-G formula based on Cr of 1). No results for input(s): VANCOTROUGH, VANCOPEAK, VANCORANDOM, GENTTROUGH, GENTPEAK, GENTRANDOM, TOBRATROUGH, TOBRAPEAK, TOBRARND, AMIKACINPEAK, AMIKACINTROU, AMIKACIN in the last 72 hours.   Microbiology: Recent Results (from the past 720 hour(s))  MRSA PCR Screening     Status: None   Collection Time: 03/11/15  2:00 PM  Result Value Ref Range Status   MRSA by PCR  NEGATIVE Final    RESULTS INVALID. NOTIFIED JAMIE SMITH GREGORY AT 1730 FOR RECOLLECT MSS  Culture, bal-quantitative     Status: None (Preliminary result)   Collection Time: 03/11/15  6:45 PM  Result Value Ref Range Status   Specimen Description BRONCHIAL ALVEOLAR LAVAGE  Final   Special Requests Normal  Final   Gram Stain PENDING  Incomplete   Culture HOLDING FOR POSSIBLE PATHOGEN  Final   Report Status PENDING  Incomplete    Medical History: Past Medical History  Diagnosis Date  . Hypertension   . Hypercholesteremia   . Stroke     Medications:  Scheduled:  . antiseptic oral rinse  7 mL Mouth Rinse QID  . aspirin  300 mg Rectal Daily  . chlorhexidine gluconate  15 mL Mouth Rinse BID  . enoxaparin (LOVENOX) injection  40 mg Subcutaneous Q24H  .  feeding supplement (VITAL HIGH PROTEIN)  1,000 mL Per Tube Q24H  . fluticasone  2 spray Each Nare Daily  . free water  200 mL Per Tube 3 times per day  . ipratropium-albuterol  3 mL Nebulization Q6H  . pantoprazole (PROTONIX) IV  40 mg Intravenous Q12H  . piperacillin-tazobactam (ZOSYN)  IV  3.375 g Intravenous 3 times per day  . pravastatin  10 mg Oral Daily  . predniSONE  40 mg Oral Q breakfast  . pyridostigmine  60 mg Oral 3 times per day  . vancomycin  500 mg Intravenous Q18H   Infusions:  . sodium chloride 40 mL/hr at 03/11/15 1337  . fentaNYL infusion INTRAVENOUS 50 mcg/hr (03/12/15 1051)  . norepinephrine 6 mcg/min (03/12/15 1027)   PRN: ALPRAZolam, fentaNYL, midazolam, midazolam, ondansetron **OR** ondansetron (ZOFRAN) IV, senna-docusate  Assessment: 81 y/o F admitted with dysphagia developing acute respiratory failure ordered empiric abx for sepsis and possible aspiration PNA.   Goal of Therapy:  Vancomycin trough level 15-20 mcg/ml  Plan:  Will continue current therapy. Trough scheduled with the 5th dose of vancomycin. Will f/u renal function and culture results.   Luisa Hart D 03/12/2015,12:30 PM

## 2015-03-12 NOTE — Consult Note (Signed)
Subjective: Patient seen for acute onset dysphagia. Patient wean from the ventilator today. He denies any nausea or abdominal or chest pain. She is using a action for oral secretions.  Objective: Vital signs in last 24 hours: Temp:  [97.2 F (36.2 C)-99.7 F (37.6 C)] 99.3 F (37.4 C) (09/30 1600) Pulse Rate:  [51-122] 66 (09/30 1600) Resp:  [12-28] 17 (09/30 1600) BP: (96-163)/(51-87) 107/63 mmHg (09/30 1600) SpO2:  [100 %] 100 % (09/30 1600) FiO2 (%):  [30 %-40 %] 30 % (09/30 1100) Blood pressure 107/63, pulse 66, temperature 99.3 F (37.4 C), temperature source Core (Comment), resp. rate 17, height  (1.499 m), weight 48.081 kg (106 lb), SpO2 100 %.   Intake/Output from previous day: 09/29 0701 - 09/30 0700 In: 2070.3 [I.V.:1720.3; IV Piggyback:350] Out: 400 [Urine:400]  Intake/Output this shift: Total I/O In: 122.5 [I.V.:63.8; NG/GT:58.7] Out: 425 [Urine:425]   General appearance:  81 year old female no acute distress Resp:  Clear to auscultation occasional coarse rhonchi Cardio:  Regular rate and rhythm GI:  Soft nontender nondistended bowel sounds are positive Extremities:     Lab Results: Results for orders placed or performed during the hospital encounter of 03/09/15 (from the past 24 hour(s))  Culture, bal-quantitative     Status: None (Preliminary result)   Collection Time: 03/11/15  6:45 PM  Result Value Ref Range   Specimen Description BRONCHIAL ALVEOLAR LAVAGE    Special Requests Normal    Gram Stain PENDING    Culture HOLDING FOR POSSIBLE PATHOGEN    Report Status PENDING   Troponin I (q 6hr x 3)     Status: None   Collection Time: 03/11/15 11:02 PM  Result Value Ref Range   Troponin I <0.03 <0.031 ng/mL  CKMB([facility] only)     Status: None   Collection Time: 03/11/15 11:02 PM  Result Value Ref Range   CK, MB 4.5 0.5 - 5.0 ng/mL  Troponin I (q 6hr x 3)     Status: None   Collection Time: 03/12/15  5:20 AM  Result Value Ref Range   Troponin I  <0.03 <0.031 ng/mL  [facility]MB([facility] only)     Status: Abnormal   Collection Time: 03/12/15  5:20 AM  Result Value Ref Range   CK, MB 6.7 (H) 0.5 - 5.0 ng/mL  Procalcitonin     Status: None   Collection Time: 03/12/15  5:20 AM  Result Value Ref Range   Procalcitonin 0.47 ng/mL  Blood gas, arterial     Status: Abnormal (Preliminary result)   Collection Time: 03/12/15  4:00 PM  Result Value Ref Range   FIO2 0.30    Delivery systems VENTILATOR    Mode CONTINUOUS POSITIVE AIRWAY PRESSURE    Peep/cpap 5.0 cm H20   Pressure support 5.0 cm H20   pH, Arterial 7.32 (L) 7.350 - 7.450   pCO2 arterial 42 32.0 - 48.0 mmHg   pO2, Arterial 107 83.0 - 108.0 mmHg   Bicarbonate 21.6 21.0 - 28.0 mEq/L   Acid-base deficit 4.3 (H) 0.0 - 2.0 mmol/L   O2 Saturation 97.7 %   Patient temperature 37.0    Collection site LEFT RADIAL    Drawn by [id number]    Sample type ARTERIAL DRAW    Allens test (pass/fail) POSITIVE (A) PASS   Mechanical Rate PENDING   Glucose, capillary     Status: Abnormal   Collection Time: 03/12/15  4:34 PM  Result Value Ref Range   Glucose-Capillary 187 (H) 65 - 99 mg/dL  Comment 1 Notify RN       Recent Labs  03/10/15 0611 03/11/15 1615  WBC 6.1 21.2*  HGB 14.2 12.8  HCT 43.1 39.4  PLT 257 285   BMET  Recent Labs  03/10/15 0611 03/11/15 1615  NA 142 135  K 4.0 3.1*  CL 108 105  CO2 21* 17*  GLUCOSE 74 228*  BUN 17 19  CREATININE 0.79 1.00  CALCIUM 9.2 8.1*   LFT  Recent Labs  03/11/15 1615  PROT 6.0*  ALBUMIN 3.5  AST 25  ALT 12*  ALKPHOS 94  BILITOT 1.6*   PT/INR  Recent Labs  03/10/15 1420  LABPROT 13.9  INR 1.05   Hepatitis Panel No results for input(s): HEPBSAG, HCVAB, HEPAIGM, HEPBIGM in the last 72 hours. C-Diff No results for input(s): CDIFFTOX in the last 72 hours. No results for input(s): CDIFFPCR in the last 72 hours.   Studies/Results: Dg Fluoro Rm 1-60 Min  03/11/2015   CLINICAL DATA:  Orogastric tube placement. Unsuccessful  attempt dad orogastric tube placement at bedside.  EXAM: FLOURO RM 1-60 MIN  FLUOROSCOPY TIME:  Radiation Exposure Index (as provided by the fluoroscopic device): 3.5 mGy  COMPARISON:  None.  FINDINGS: A Dobbhoff tube was inserted through the right nare into the esophagus with the metallic tip projecting over the fundus of the stomach. The tube was secured to the nose.  IMPRESSION: Dobbhoff tube with the tip projecting over the fundus of the stomach.   Electronically Signed   By: Elige Ko   On: 03/11/2015 17:05   Portable Chest Xray  03/11/2015   CLINICAL DATA:  Respiratory failure.  Intubation  EXAM: PORTABLE CHEST 1 VIEW  COMPARISON:  03/09/2015  FINDINGS: Endotracheal tube enters the right main bronchus. Recommend withdrawal 3 of 4 cm.  The right lung is hyperinflated. There is collapse in the left lower lobe.  NG tube coiled in the hiatal hernia above the diaphragm.  Negative for heart failure.  IMPRESSION: Endotracheal tube right main bronchus, recommend withdrawal of 3-4 cm.  Collapse of left lower lobe  NG tube coiled in the hiatal hernia  These results were called by telephone at the time of interpretation on 03/11/2015 at 12:46 pm to [facility], RN , who verbally acknowledged these results.   Electronically Signed   By: Marlan Palau M.D.   On: 03/11/2015 12:47    Scheduled Inpatient Medications:   . antiseptic oral rinse  7 mL Mouth Rinse QID  . aspirin  300 mg Rectal Daily  . chlorhexidine gluconate  15 mL Mouth Rinse BID  . enoxaparin (LOVENOX) injection  30 mg Subcutaneous Q24H  . famotidine  20 mg Per Tube BID  . feeding supplement (VITAL HIGH PROTEIN)  1,000 mL Per Tube Q24H  . fluticasone  2 spray Each Nare Daily  . free water  200 mL Per Tube 3 times per day  . ipratropium-albuterol  3 mL Nebulization Q6H  . piperacillin-tazobactam (ZOSYN)  IV  3.375 g Intravenous 3 times per day  . pravastatin  10 mg Oral Daily  . predniSONE  40 mg Oral Q breakfast  . pyridostigmine  60 mg Oral 3  times per day  . vancomycin  500 mg Intravenous Q18H    Continuous Inpatient Infusions:   . sodium chloride 40 mL/hr at 03/11/15 1337  . fentaNYL infusion INTRAVENOUS Stopped (03/12/15 1641)  . norepinephrine Stopped (03/12/15 1641)    PRN Inpatient Medications:  ALPRAZolam, fentaNYL, midazolam, midazolam, ondansetron **OR**  ondansetron (ZOFRAN) IV, senna-docusate  Miscellaneous:   Assessment:  1. Daily onset dysphagia. Evaluation by neurology indicating likelihood of acute exacerbation of myasthenia gravis. He may need possible transfer for plasma exchange. 2. Respiratory failure status post aspiration doing better now off ventilator.  Plan:  1. Continue current. EGD when clinically feasible. We'll need neuro-clearance in this regard. Dr. Servando Snare on call this weekend if needed.  Christena Deem MD 03/12/2015, 5:51 PM

## 2015-03-12 NOTE — Progress Notes (Signed)
Glu up Add ssi

## 2015-03-12 NOTE — Progress Notes (Signed)
Placed on high fowlers position, cuff deflated, suctioned orally and endotracheally and then extubated to 2 lpm O2 Nasal canula

## 2015-03-12 NOTE — Progress Notes (Signed)
Subjective: 81 y/o f with dysphagia - possible Myasthenia developed  Acute Respiratory failure secondary to pneumonia-Patient currently . Intubated on mechanical ventilation. Sedation being weaned off Daughters at bedside.   Objective: Vital signs in last 24 hours: Temp:  [95.7 F (35.4 C)-99.7 F (37.6 C)] 99.3 F (37.4 C) (09/30 0800) Pulse Rate:  [56-122] 56 (09/30 0800) Resp:  [12-28] 14 (09/30 0800) BP: (71-167)/(45-87) 105/63 mmHg (09/30 0800) SpO2:  [95 %-100 %] 100 % (09/30 0800) FiO2 (%):  [30 %-40 %] 30 % (09/30 0738) Weight change:     Intake/Output from previous day: 09/29 0701 - 09/30 0700 In: 2070.3 [I.V.:1720.3; IV Piggyback:350] Out: 400 [Urine:400] Intake/Output this shift: Total I/O In: 63.8 [I.V.:63.8] Out: -   GENERAL:Awake-On mechanical ventilation. EYES: No pallor, no icterus. HEENT:  Endotracheal tube in place. Head atraumatic, normocephalic.  NECK: supple, no jugular venous distention. No thyroid enlargement, no tenderness.  LUNGS: clear to auscultation. No wheezing, rhonchi or crepitation. Good air entry bilaterally. CARDIOVASCULAR: regular rate and rhythm, S1, S2 normal. No tachycardia.  ABDOMEN: Soft, non-tender, normal bowel sounds, no organomegaly or mass.  EXTREMITIES: No pedal edema, cyanosis, or clubbing.  NEUROLOGIC: Sedated, unable to assess.  Lab Results:  Recent Labs  03/10/15 0611 03/11/15 1615  WBC 6.1 21.2*  HGB 14.2 12.8  HCT 43.1 39.4  PLT 257 285   BMET  Recent Labs  03/10/15 0611 03/11/15 1615  NA 142 135  K 4.0 3.1*  CL 108 105  CO2 21* 17*  GLUCOSE 74 228*  BUN 17 19  CREATININE 0.79 1.00  CALCIUM 9.2 8.1*    Studies/Results: Mr Brain Wo Contrast  03/10/2015   CLINICAL DATA:  81 year old female with left eye blurred vision and left eye drooping. Generalized weakness and difficulty swallowing for 2 days. Decreased p.o. intake. Altered mental status. Initial encounter.  EXAM: MRI HEAD WITHOUT CONTRAST   TECHNIQUE: Multiplanar, multiecho pulse sequences of the brain and surrounding structures were obtained without intravenous contrast.  COMPARISON:  Neck CT and head CT 03/09/2015.  Brain MRI 12/18/2013.  FINDINGS: Cerebral volume is stable since 2015. Major intracranial vascular flow voids are stable. No restricted diffusion or evidence of acute infarction.  Chronic lacunar type infarcts in both cerebellar hemispheres, an the right corona radiata. Stable mild to moderate for age T2 heterogeneity in the deep gray matter nuclei and cerebral white matter elsewhere. Probable normal sulcus anatomic variation in the right parietal lobe again noted (series 10, image 20) or less likely small arachnoid cyst.  No midline shift, mass effect, evidence of mass lesion, ventriculomegaly, or acute intracranial hemorrhage. Cervicomedullary junction and pituitary are within normal limits. Stable right mastoid effusion. Stable paranasal sinuses since 2015. Interval right globe postoperative changes. Other orbit and scalp soft tissues are stable. Normal bone marrow signal.  There is chronic cervical spine degeneration, with at least mild spinal stenosis at C3-C4 and C4-C5. This does not appear significantly progressed since 2015.  IMPRESSION: 1. No acute intracranial abnormality. Chronic small vessel ischemic disease is stable since 2015. 2. Chronic cervical spine degeneration with C3-C4 and C4-C5 spinal stenosis which does not appear significantly progressed since 2015.   Electronically Signed   By: Odessa Fleming M.D.   On: 03/10/2015 09:57   Dg Fluoro Rm 1-60 Min  03/11/2015   CLINICAL DATA:  Orogastric tube placement. Unsuccessful attempt dad orogastric tube placement at bedside.  EXAM: FLOURO RM 1-60 MIN  FLUOROSCOPY TIME:  Radiation Exposure Index (as provided by the fluoroscopic  device): 3.5 mGy  COMPARISON:  None.  FINDINGS: A Dobbhoff tube was inserted through the right nare into the esophagus with the metallic tip projecting over  the fundus of the stomach. The tube was secured to the nose.  IMPRESSION: Dobbhoff tube with the tip projecting over the fundus of the stomach.   Electronically Signed   By: Elige Ko   On: 03/11/2015 17:05   US Carotid Bilateral  03/10/2015   CLINICAL DATA:  Dysphagia. Remote stroke. Syncope, visual disturbance, hyperlipidemia. Remote endarterectomy 15 years ago, laterality not specified.  EXAM: BILATERAL CAROTID DUPLEX ULTRASOUND  TECHNIQUE: Wallace Cullens scale imaging, color Doppler and duplex ultrasound was performed of bilateral carotid and vertebral arteries in the neck.  COMPARISON:  CT 03/09/2015, ultrasound 12/18/2013  REVIEW OF SYSTEMS: Quantification of carotid stenosis is based on velocity parameters that correlate the residual internal carotid diameter with NASCET-based stenosis levels, using the diameter of the distal internal carotid lumen as the denominator for stenosis measurement.  The following velocity measurements were obtained:  PEAK SYSTOLIC/END DIASTOLIC  RIGHT  ICA:                     149/11cm/sec  CCA:                     84/12cm/sec  SYSTOLIC ICA/CCA RATIO:  1.8  DIASTOLIC ICA/CCA RATIO: 0.9  ECA:                     84cm/sec  LEFT  ICA:                     82/21cm/sec  CCA:                     121/13cm/sec  SYSTOLIC ICA/CCA RATIO:  0.7  DIASTOLIC ICA/CCA RATIO: 1.6  ECA:                     80cm/sec  FINDINGS: RIGHT CAROTID ARTERY: Eccentric partially calcified plaque effaces the bulb and extends into the proximal ICA resulting in at least mild stenosis. There mildly elevated peak systolic velocities at the level of plaque in the proximal ICA. Elsewhere normal waveforms and color Doppler signal.  RIGHT VERTEBRAL ARTERY:  Normal flow direction and waveform.  LEFT CAROTID ARTERY: Mild intimal thickening. No focal plaque or stenosis. Normal waveforms and color Doppler signal.  LEFT VERTEBRAL ARTERY: Normal flow direction and waveform.  IMPRESSION: 1. Right carotid bifurcation and proximal ICA  plaque, resulting in less than 50% diameter stenosis. The exam does not exclude plaque ulceration or embolization. Continued surveillance recommended. 2. No significant left carotid plaque or stenosis.   Electronically Signed   By: Corlis Leak M.D.   On: 03/10/2015 13:36   Portable Chest Xray  03/11/2015   CLINICAL DATA:  Respiratory failure.  Intubation  EXAM: PORTABLE CHEST 1 VIEW  COMPARISON:  03/09/2015  FINDINGS: Endotracheal tube enters the right main bronchus. Recommend withdrawal 3 of 4 cm.  The right lung is hyperinflated. There is collapse in the left lower lobe.  NG tube coiled in the hiatal hernia above the diaphragm.  Negative for heart failure.  IMPRESSION: Endotracheal tube right main bronchus, recommend withdrawal of 3-4 cm.  Collapse of left lower lobe  NG tube coiled in the hiatal hernia  These results were called by telephone at the time of interpretation on 03/11/2015 at 12:46 pm to Asher Muir, RN , who  verbally acknowledged these results.   Electronically Signed   By: Marlan Palau M.D.   On: 03/11/2015 12:47    Medications:  Scheduled Meds: . antiseptic oral rinse  7 mL Mouth Rinse QID  . aspirin  300 mg Rectal Daily  . chlorhexidine gluconate  15 mL Mouth Rinse BID  . enoxaparin (LOVENOX) injection  40 mg Subcutaneous Q24H  . fluticasone  2 spray Each Nare Daily  . ipratropium-albuterol  3 mL Nebulization Q6H  . pantoprazole (PROTONIX) IV  40 mg Intravenous Q12H  . piperacillin-tazobactam (ZOSYN)  IV  3.375 g Intravenous 3 times per day  . pravastatin  10 mg Oral Daily  . predniSONE  40 mg Oral Q breakfast  . pyridostigmine  60 mg Oral 3 times per day  . vancomycin  500 mg Intravenous Q18H   Continuous Infusions: . sodium chloride 40 mL/hr at 03/11/15 1337  . fentaNYL infusion INTRAVENOUS 100 mcg/hr (03/12/15 0745)  . norepinephrine 10 mcg/min (03/12/15 0800)   PRN Meds:.ALPRAZolam, fentaNYL, midazolam, midazolam, ondansetron **OR** ondansetron (ZOFRAN) IV,  senna-docusate   Assessment/Plan: 1 Acute Respiratory failure/ Pneumonia Wean sedation. Continue antibiotics;  Awaiting cultures from BAL 2 Dysphagia/ Drooping of left eye:and speech disturbance: Possible Myasthenia. Appreciate help from GI and Neurology.  No acute intracranial abnormality on initial CT head and  MRI  Carotid Doppler revealed right carotid bifurcation and proximal internal carotid artery plaque resulting in less than 50% Continue Aspirin and pravastatin.     DVT prophylaxis with Lovenox.  GI prophylaxis with IV Protonix. Full code   LOS: 3 days   HANDE,VISHWANATH 03/12/2015, 8:25 AM

## 2015-03-13 ENCOUNTER — Inpatient Hospital Stay: Payer: Commercial Managed Care - HMO

## 2015-03-13 LAB — CBC WITH DIFFERENTIAL/PLATELET
BASOS ABS: 0 10*3/uL (ref 0–0.1)
BASOS PCT: 0 %
EOS ABS: 0 10*3/uL (ref 0–0.7)
EOS PCT: 0 %
HCT: 36.2 % (ref 35.0–47.0)
Hemoglobin: 12.2 g/dL (ref 12.0–16.0)
LYMPHS PCT: 4 %
Lymphs Abs: 0.5 10*3/uL — ABNORMAL LOW (ref 1.0–3.6)
MCH: 31.1 pg (ref 26.0–34.0)
MCHC: 33.8 g/dL (ref 32.0–36.0)
MCV: 92 fL (ref 80.0–100.0)
Monocytes Absolute: 0.5 10*3/uL (ref 0.2–0.9)
Monocytes Relative: 4 %
Neutro Abs: 10.9 10*3/uL — ABNORMAL HIGH (ref 1.4–6.5)
Neutrophils Relative %: 92 %
PLATELETS: 190 10*3/uL (ref 150–440)
RBC: 3.93 MIL/uL (ref 3.80–5.20)
RDW: 14.1 % (ref 11.5–14.5)
WBC: 11.9 10*3/uL — AB (ref 3.6–11.0)

## 2015-03-13 LAB — BLOOD GAS, ARTERIAL
Acid-base deficit: 0.6 mmol/L (ref 0.0–2.0)
Bicarbonate: 23.2 mEq/L (ref 21.0–28.0)
FIO2: 0.28
O2 Saturation: 99.6 %
PATIENT TEMPERATURE: 37
PH ART: 7.43 (ref 7.350–7.450)
pCO2 arterial: 35 mmHg (ref 32.0–48.0)
pO2, Arterial: 171 mmHg — ABNORMAL HIGH (ref 83.0–108.0)

## 2015-03-13 LAB — PROCALCITONIN: PROCALCITONIN: 0.63 ng/mL

## 2015-03-13 LAB — GLUCOSE, CAPILLARY
GLUCOSE-CAPILLARY: 140 mg/dL — AB (ref 65–99)
GLUCOSE-CAPILLARY: 135 mg/dL — AB (ref 65–99)
GLUCOSE-CAPILLARY: 98 mg/dL (ref 65–99)
GLUCOSE-CAPILLARY: 87 mg/dL (ref 65–99)

## 2015-03-13 LAB — BASIC METABOLIC PANEL
ANION GAP: 7 (ref 5–15)
BUN: 21 mg/dL — ABNORMAL HIGH (ref 6–20)
CALCIUM: 9 mg/dL (ref 8.9–10.3)
CO2: 26 mmol/L (ref 22–32)
Chloride: 109 mmol/L (ref 101–111)
Creatinine, Ser: 1.11 mg/dL — ABNORMAL HIGH (ref 0.44–1.00)
GFR, EST AFRICAN AMERICAN: 50 mL/min — AB (ref >60)
GFR, EST NON AFRICAN AMERICAN: 43 mL/min — AB (ref >60)
Glucose, Bld: 133 mg/dL — ABNORMAL HIGH (ref 65–99)
POTASSIUM: 3.2 mmol/L — AB (ref 3.5–5.1)
SODIUM: 142 mmol/L (ref 135–145)

## 2015-03-13 LAB — PHOSPHORUS: PHOSPHORUS: 2.2 mg/dL — AB (ref 2.5–4.6)

## 2015-03-13 LAB — MAGNESIUM: Magnesium: 1.7 mg/dL (ref 1.7–2.4)

## 2015-03-13 MED ORDER — IMMUNE GLOBULIN (HUMAN) 20 GM/200ML IV SOLN
400.0000 mg/kg | INTRAVENOUS | Status: DC
  Administered 2015-03-13 – 2015-03-14 (×2): 20 g via INTRAVENOUS
  Filled 2015-03-13 (×4): qty 200

## 2015-03-13 MED ORDER — HALOPERIDOL LACTATE 5 MG/ML IJ SOLN: 5.0000 mg | Freq: Once | INTRAMUSCULAR | Status: DC

## 2015-03-13 MED ORDER — POTASSIUM PHOSPHATES 15 MMOLE/5ML IV SOLN
30.0000 mmol | Freq: Once | INTRAVENOUS | Status: AC
  Administered 2015-03-13: 30 mmol via INTRAVENOUS
  Filled 2015-03-13: qty 10

## 2015-03-13 MED ORDER — FREE WATER: 200.0000 mL | Freq: Two times a day (BID) | Status: DC

## 2015-03-13 MED ORDER — HALOPERIDOL LACTATE 5 MG/ML IJ SOLN
2.5000 mg | INTRAMUSCULAR | Status: DC | PRN
  Filled 2015-03-13: qty 1

## 2015-03-13 MED ORDER — POTASSIUM CHLORIDE 10 MEQ/100ML IV SOLN
10.0000 meq | INTRAVENOUS | Status: AC
  Administered 2015-03-13 (×2): 10 meq via INTRAVENOUS
  Filled 2015-03-13 (×2): qty 100

## 2015-03-13 MED ORDER — LORAZEPAM 2 MG/ML IJ SOLN
1.0000 mg | Freq: Once | INTRAMUSCULAR | Status: AC
  Administered 2015-03-13: 1 mg via INTRAVENOUS
  Filled 2015-03-13: qty 1

## 2015-03-13 MED ORDER — HALOPERIDOL LACTATE 5 MG/ML IJ SOLN
2.0000 mg | Freq: Once | INTRAMUSCULAR | Status: AC
  Administered 2015-03-13: 2 mg via INTRAVENOUS
  Filled 2015-03-13: qty 1

## 2015-03-13 MED ORDER — HALOPERIDOL LACTATE 5 MG/ML IJ SOLN
INTRAMUSCULAR | Status: AC
  Administered 2015-03-13: 07:00:00
  Filled 2015-03-13: qty 1

## 2015-03-13 MED ORDER — DEXMEDETOMIDINE HCL IN NACL 400 MCG/100ML IV SOLN
0.4000 ug/kg/h | INTRAVENOUS | Status: DC
  Filled 2015-03-13: qty 100

## 2015-03-13 MED ORDER — JEVITY 1.5 CAL/FIBER PO LIQD: 1000.0000 mL | ORAL | Status: DC

## 2015-03-13 NOTE — Progress Notes (Signed)
Combative, striking out at staff.  Elink MD notified.  New orders entered.  Will continue to monitor.

## 2015-03-13 NOTE — Progress Notes (Signed)
Pt confused, pulled R fem line out.  Catheter appears intact.  Bleeding stopped.  Elink informed.  IVF to RAC PIV.  Will contnue to monitor.

## 2015-03-13 NOTE — Progress Notes (Signed)
Pt confused, pulling at lines and NGT.  Spoke with elink MD about patient behavior.  New orders entered by MD.  Will continue to monitor.

## 2015-03-13 NOTE — Progress Notes (Signed)
eLink Physician-Brief Progress Note Patient Name: Cheyenne Boone DOB: Oct 10, 1923 MRN: [id number]   Date of Service  03/13/2015  HPI/Events of Note  Confused and agitated.  Sats are 100% with stable BP and HR.  Extubated today.  QTc on EKG is less than 500.  eICU Interventions  Plan: 2 mg haldol IV times one.  Nurse to call [facility] in 30 minutes if patient remains agitated.     Intervention Category Major Interventions: Delirium, psychosis, severe agitation - evaluation and management  DETERDING,ELIZABETH 03/13/2015, 1:56 AM

## 2015-03-13 NOTE — Progress Notes (Signed)
NEUROLOGY NOTE  S: Pulled NG tube out last night and no longer getting meds.  Very agitated per nursing  ROS unobtainable to intubation  O: 97.3 154/71 89 14 Mild distress, nl weight Normocephalic, oropharynx clear Supple, no JVD Coarse BS, decreased movement RRR, no murmur No C/C/E   Alert but agitated and no longer follows PERRLA, +corneals good, good cough wihtdrawals equally  A/P: 1. Probable myasthenia gravis- strength still stronger but pt not getting meds now 2. Encephalopathy- resolved -  Start IVIG 0.4mg /kg daily x 5 days even though PLEX is better for MG -  Avoid typical neuroleptics for agitation;  Ativan order placed but back with Geodon -  Needs Dobhoff again - Continue Mestinon  TID PO - Continue Prednisone  daily PO - Avoid flouroquinolones and imipenums when treating aspiration because these worsen MG - Labs are pending - Continue respiratory support and check FVCs prior to extubation - Will follow closely  - Would hold off on transfer unless pt can not get extubated; If breathing is still weak, would then perform plasma exchange which will have to be done at outside hospital  36 minutes of critical care time spent, examining pt, reviewing chart and coordinating care

## 2015-03-13 NOTE — Progress Notes (Signed)
eLink Physician-Brief Progress Note Patient Name: MCKYNLEE LUSE DOB: January 04, 1924 MRN: [id number]   Date of Service  03/13/2015  HPI/Events of Note  Hypokalemia and hypophosphatemia  eICU Interventions  Potassium and Phos replaced     Intervention Category Intermediate Interventions: Electrolyte abnormality - evaluation and management  Deosha Werden 03/13/2015, 4:59 AM

## 2015-03-13 NOTE — Progress Notes (Signed)
Patient ID: MYOSHA CUADRAS, female   DOB: 11-15-1923, 81 y.o.   MRN: [id number] Subjective: 81 y/o f with dysphagia - possible Myasthenia developed  Acute Respiratory failure secondary to pneumonia-Patient currently . Extubated, very agitated overnight  Objective: Vital signs in last 24 hours: Temp:  [98.1 F (36.7 C)-99.9 F (37.7 C)] 98.1 F (36.7 C) (10/01 0900) Pulse Rate:  [51-102] 94 (10/01 0600) Resp:  [11-20] 20 (10/01 0900) BP: (96-222)/(51-202) 109/58 mmHg (10/01 0900) SpO2:  [93 %-100 %] 96 % (10/01 0900) FiO2 (%):  [30 %] 30 % (09/30 1100) Weight:  [48.2 kg (106 lb 4.2 oz)] 48.2 kg (106 lb 4.2 oz) (10/01 0500) Weight change:     Intake/Output from previous day: 09/30 0701 - 10/01 0700 In: 122.5 [I.V.:63.8; NG/GT:58.7] Out: 425 [Urine:425] Intake/Output this shift:    GENERAL:Awake-On mechanical ventilation. EYES: No pallor, no icterus. HEENT:  Endotracheal tube in place. Head atraumatic, normocephalic.  NECK: supple, no jugular venous distention. No thyroid enlargement, no tenderness.  LUNGS: clear to auscultation. No wheezing, rhonchi or crepitation. Good air entry bilaterally. CARDIOVASCULAR: regular rate and rhythm, S1, S2 normal. No tachycardia.  ABDOMEN: Soft, non-tender, normal bowel sounds, no organomegaly or mass.  EXTREMITIES: No pedal edema, cyanosis, or clubbing.  NEUROLOGIC: Agitated  Lab Results:  Recent Labs  03/11/15 1615 03/13/15 0422  WBC 21.2* 11.9*  HGB 12.8 12.2  HCT 39.4 36.2  PLT 285 190   BMET  Recent Labs  03/11/15 1615 03/13/15 0422  NA 135 142  K 3.1* 3.2*  CL 105 109  CO2 17* 26  GLUCOSE 228* 133*  BUN 19 21*  CREATININE 1.00 1.11*  CALCIUM 8.1* 9.0    Studies/Results: Dg Chest 1 View  03/13/2015   CLINICAL DATA:  Shortness of Breath  EXAM: CHEST 1 VIEW  COMPARISON:  March 11, 2015  FINDINGS: Endotracheal tube and nasogastric tube have been removed. No apparent pneumothorax. There is persistent  consolidation with volume loss in the left lower lobe. There is a small left effusion. There is mild atelectasis in the medial right base. The right lung is otherwise clear. Heart is upper normal in size with pulmonary vascularity within normal limits. There is calcification in the mitral annulus. There is extensive calcification in the aorta. No adenopathy.  IMPRESSION: No pneumothorax. Persistent left lower lobe consolidation with small left effusion. New atelectasis medial right base. No change in cardiac silhouette allowing for differences in position and technique.   Electronically Signed   By: Bretta Bang III M.D.   On: 03/13/2015 07:30   Dg Fluoro Rm 1-60 Min  03/11/2015   CLINICAL DATA:  Orogastric tube placement. Unsuccessful attempt dad orogastric tube placement at bedside.  EXAM: FLOURO RM 1-60 MIN  FLUOROSCOPY TIME:  Radiation Exposure Index (as provided by the fluoroscopic device): 3.5 mGy  COMPARISON:  None.  FINDINGS: A Dobbhoff tube was inserted through the right nare into the esophagus with the metallic tip projecting over the fundus of the stomach. The tube was secured to the nose.  IMPRESSION: Dobbhoff tube with the tip projecting over the fundus of the stomach.   Electronically Signed   By: Elige Ko   On: 03/11/2015 17:05   Portable Chest Xray  03/11/2015   CLINICAL DATA:  Respiratory failure.  Intubation  EXAM: PORTABLE CHEST 1 VIEW  COMPARISON:  03/09/2015  FINDINGS: Endotracheal tube enters the right main bronchus. Recommend withdrawal 3 of 4 cm.  The right lung is hyperinflated. There is collapse  in the left lower lobe.  NG tube coiled in the hiatal hernia above the diaphragm.  Negative for heart failure.  IMPRESSION: Endotracheal tube right main bronchus, recommend withdrawal of 3-4 cm.  Collapse of left lower lobe  NG tube coiled in the hiatal hernia  These results were called by telephone at the time of interpretation on 03/11/2015 at 12:46 pm to [facility], RN , who verbally  acknowledged these results.   Electronically Signed   By: Marlan Palau M.D.   On: 03/11/2015 12:47    Medications:  Scheduled Meds: . antiseptic oral rinse  7 mL Mouth Rinse QID  . aspirin  300 mg Rectal Daily  . chlorhexidine gluconate  15 mL Mouth Rinse BID  . enoxaparin (LOVENOX) injection  30 mg Subcutaneous Q24H  . famotidine  20 mg Per Tube BID  . feeding supplement (VITAL HIGH PROTEIN)  1,000 mL Per Tube Q24H  . fluticasone  2 spray Each Nare Daily  . free water  200 mL Per Tube 3 times per day  . haloperidol lactate  5 mg Intravenous Once  . insulin aspart  0-9 Units Subcutaneous 6 times per day  . ipratropium-albuterol  3 mL Nebulization Q6H  . piperacillin-tazobactam (ZOSYN)  IV  3.375 g Intravenous 3 times per day  . potassium phosphate IVPB (mmol)  30 mmol Intravenous Once  . pravastatin  10 mg Oral Daily  . predniSONE  40 mg Oral Q breakfast  . pyridostigmine  60 mg Oral 3 times per day  . vancomycin  500 mg Intravenous Q18H   Continuous Infusions: . sodium chloride 40 mL/hr at 03/11/15 1337  . dexmedetomidine 0.4 mcg/kg/hr (03/13/15 0917)  . fentaNYL infusion INTRAVENOUS Stopped (03/12/15 1641)  . norepinephrine Stopped (03/12/15 1641)   PRN Meds:.ALPRAZolam, fentaNYL, haloperidol lactate, midazolam, midazolam, ondansetron **OR** ondansetron (ZOFRAN) IV, senna-docusate   Assessment/Plan: 1 Acute Respiratory failure/ Pneumonia Extubated 2 Dysphagia/ Drooping of left eye:and speech disturbance: Possible Myasthenia. Care per ICU and neurology team  DVT prophylaxis with Lovenox.  GI prophylaxis with IV Protonix. Full code   LOS: 4 days   Jerardo Costabile F. 03/13/2015, 9:19 AM

## 2015-03-13 NOTE — Progress Notes (Signed)
Haldol administered per MD orders.  Pt has pulled Dobbhoff completely out.  Pt had been pulling at NGT prior to calling elink MD.  NGT was pulled out 5-7 inches.  Replaced line but shut off feed because of uncertainty of placement.  MD aware that feed had been stopped.

## 2015-03-13 NOTE — Progress Notes (Addendum)
Nutrition Follow-up   INTERVENTION:   EN: Per Nsg, Dobhoff removed over night by pt. MD, Nicholos Johns agreeable to replace dobhoff tube for nutrition as pt not having EGD this weekend and unable to swallow per notes. RN Mardene Celeste aware and agreeable. If successful, recommend restarting TF however since pt has been extubated will recommend Jevity 1.5 at 60mL/hr providing 1152kcals and 49g protein, with BID of free water (total of of free water) with IVF currently infusing.  NUTRITION DIAGNOSIS:   Inadequate oral intake related to dysphagia, acute illness as evidenced by NPO status, per patient/family report.  GOAL:   Provide needs based on ASPEN/SCCM guidelines  MONITOR:    (Energy Intake, Anthropometrics, Electrolyte/Renal Profile, Digestive System, Pulmonary)  REASON FOR ASSESSMENT:   Malnutrition Screening Tool, Ventilator, Diagnosis    ASSESSMENT:    Pt combative and agitated last night, removed dobhoff tube. Pt s/p extubation with plans for upper endoscopy when medically stable. Per RN Mardene Celeste pt started on precedex.   Diet Order:  Diet NPO time specified    Current Nutrition: Pt tolerating TF of Vital High Protein at 56mL/hr until pulled out.    Gastrointestinal Profile: abdomen soft, hypoactive, faint BS, nontender abdomen per Nsg documentation Last BM: unknown   Medications: prednisone, novolog, KPhos given  Electrolyte/Renal Profile and Glucose Profile:   Recent Labs Lab 03/10/15 0611 03/11/15 1615 03/13/15 0422  NA 142 135 142  K 4.0 3.1* 3.2*  CL 108 105 109  CO2 21* 17* 26  BUN 17 19 21*  CREATININE 0.79 1.00 1.11*  CALCIUM 9.2 8.1* 9.0  MG  --   --  1.7  PHOS  --   --  2.2*  GLUCOSE 74 228* 133*   Protein Profile:   Recent Labs Lab 03/11/15 1615  ALBUMIN 3.5     Weight Trend since Admission: Filed Weights   03/09/15 1008 03/13/15 0500  Weight: 106 lb (48.081 kg) 106 lb 4.2 oz (48.2 kg)     Skin:  Reviewed, no  issues   BMI:  Body mass index is 21.45 kg/(m^2).  Estimated Nutritional Needs:   Kcal:  BEE: 817kcals, TEE: (IF 1.1-1.3)(AF1.2) [id number]  Protein:  48-58g protein (1.0-1.2g/kg)  Fluid:  1200-1410mL of fluid (25-30mL/kg)   HIGH Care Level  Leda Quail, RD, LDN Pager [phone]

## 2015-03-13 NOTE — Progress Notes (Signed)
Pt HR decreased in the 40s, levophed stopped, pt alert, moving ext, family at [facility], this RN unable to place feeding tube for meds and nutrition, MD/N (Ram & Katrinka Blazing), plan to place NGT in RAD on Monday, nursing will cont to monitor

## 2015-03-13 NOTE — Progress Notes (Addendum)
[facility] Critical Care Medicine Progess Note    ASSESSMENT/PLAN   81 year old female with dysphagia, dehydration, malnutrition, transferred to the ICU for acute respiratory distress, concern for possible aspiration. Concern for possible myasthenia gravis, currently on Mestinon.  PULMONARY OETT 7.11mm A: Acute hypoxic respiratory failure Status post Ventilator-dependent; the patient was extubated yesterday. Her postop course, complicted by agitation. Copious mucosal secretions, status post bronchoscopy. Bronchial cultures are currently pending. Patient is currently on vancomycin and Zosyn, started September 29  P:  -When necessary DuoNeb's -Empiric antibiotics -Await bronchoscopy culture results, we'll wean antibiotics as tolerated.   CARDIOVASCULAR CVL - R Fem A:  P:  - tele monitoring - check EKG - CE x 3  RENAL A:  Dehydration, doing better  P:  - cont with IVF - cont with electrolyte monitoring.   GASTROINTESTINAL A:  Dysphagia P:  - GI following, plans for upper endoscopy  - PPI, will change to H2 blocker.  HEMATOLOGIC Leukocytosis. -Likely secondary to steroids, currently at 40 mg once daily. -Continue enoxaparin for DVT prophylaxis.  INFECTIOUS A:  Sepsis ? Aspiration Pneumonia P:  Thick secretions noted during the intubation Acute respiratory distress and failure with subsequent dysphagia and difficultly swallowing, concern for aspiration. -Continue vancomycin and Zosyn. Await bronchoscopy cultures.  Lines: R Fem CVL 9/29  Cultures BCx2 9/29  Antibiotics Abx: Vanc/Zosyn, start date 9/29  ENDOCRINE - ICU hypo/hyperglycemia protocol  NEUROLOGIC Intubated and sedated Possible myasthenia gravis. Neurology following. Currently on pyridostigmine 60 mg every 8 hours. Agitation and delirium, possibly secondary to sundowning and exacerbated by steroids. P:  The patient's delirium improved this morning with the small dose  of Haldol, family at bedside was also helpful.  Social  - Patient lives next door, who is caretaker. -Patient has 2 daughters who are the health care power of attorney, currently she is a full code  Chest x-ray images and report reviewed. There is mild bibasilar atelectasis consistent with post extubation changes. ---------------------------------------   ----------------------------------------   Name: Cheyenne Boone MRN: [id number] DOB: Aug 10, 1923    ADMISSION DATE:  03/09/2015    CHIEF COMPLAINT:  Dyspnea    SUBJECTIVE:  The patient is awake. She is noncommunicative. She is combative with staff. The patient had pulled out her lines earlier this morning.   Review of Systems:  Patient has delirium, can not provide history or review of systems.     VITAL SIGNS: Temp:  [98.2 F (36.8 C)-99.9 F (37.7 C)] 98.2 F (36.8 C) (10/01 0600) Pulse Rate:  [51-102] 94 (10/01 0600) Resp:  [11-20] 13 (10/01 0600) BP: (96-222)/(51-202) 222/202 mmHg (10/01 0600) SpO2:  [93 %-100 %] 93 % (10/01 0600) FiO2 (%):  [30 %] 30 % (09/30 1100) Weight:  [48.2 kg (106 lb 4.2 oz)] 48.2 kg (106 lb 4.2 oz) (10/01 0500) HEMODYNAMICS:   VENTILATOR SETTINGS: Vent Mode:  [-] PSV FiO2 (%):  [30 %] 30 % Set Rate:  [14 bmp] 14 bmp Vt Set:  [400 mL] 400 mL PEEP:  [5 cmH20] 5 cmH20 Pressure Support:  [5 cmH20] 5 cmH20 INTAKE / OUTPUT:  Intake/Output Summary (Last 24 hours) at 03/13/15 0840 Last data filed at 03/12/15 1641  Gross per 24 hour  Intake   58.7 ml  Output    325 ml  Net -266.3 ml    PHYSICAL EXAMINATION: Physical Examination:   VS: BP 222/202 mmHg  Pulse 94  Temp(Src) 98.2 F (36.8 C) (Core (Comment))  Resp 13  Ht  (1.499 m)  Wt 48.2 kg (106 lb 4.2 oz)  BMI 21.45 kg/m2  SpO2 93%  General Appearance: No distress  Neuro:without focal findings, mental status normal. HEENT: PERRLA, EOM intact. Pulmonary: normal breath sounds   CardiovascularNormal S1,S2.  No  m/r/g.   Abdomen: Benign, Soft, non-tender. Renal:  No costovertebral tenderness  GU:  Not performed at this time. Endocrine: No evident thyromegaly. Skin:   warm, no rashes, no ecchymosis  Extremities: normal, no cyanosis, clubbing.   LABS:   LABORATORY PANEL:   CBC  Recent Labs Lab 03/13/15 0422  WBC 11.9*  HGB 12.2  HCT 36.2  PLT 190    Chemistries   Recent Labs Lab 03/11/15 1615 03/13/15 0422  NA 135 142  K 3.1* 3.2*  CL 105 109  CO2 17* 26  GLUCOSE 228* 133*  BUN 19 21*  CREATININE 1.00 1.11*  CALCIUM 8.1* 9.0  MG  --  1.7  PHOS  --  2.2*  AST 25  --   ALT 12*  --   ALKPHOS 94  --   BILITOT 1.6*  --      Recent Labs Lab 03/11/15 1034 03/11/15 1046 03/12/15 1634 03/12/15 1951 03/12/15 2355 03/13/15 0354  GLUCAP 80 86 187* 180* 162* 140*    Recent Labs Lab 03/11/15 1255 03/12/15 1600 03/13/15 0350  PHART 7.28* 7.32* 7.43  PCO2ART 38 42 35  PO2ART 95 107 171*    Recent Labs Lab 03/11/15 1615  AST 25  ALT 12*  ALKPHOS 94  BILITOT 1.6*  ALBUMIN 3.5    Cardiac Enzymes  Recent Labs Lab 03/12/15 0520  TROPONINI <0.03    RADIOLOGY:  Dg Chest 1 View  03/13/2015   CLINICAL DATA:  Shortness of Breath  EXAM: CHEST 1 VIEW  COMPARISON:  March 11, 2015  FINDINGS: Endotracheal tube and nasogastric tube have been removed. No apparent pneumothorax. There is persistent consolidation with volume loss in the left lower lobe. There is a small left effusion. There is mild atelectasis in the medial right base. The right lung is otherwise clear. Heart is upper normal in size with pulmonary vascularity within normal limits. There is calcification in the mitral annulus. There is extensive calcification in the aorta. No adenopathy.  IMPRESSION: No pneumothorax. Persistent left lower lobe consolidation with small left effusion. New atelectasis medial right base. No change in cardiac silhouette allowing for differences in position and technique.    Electronically Signed   By: Bretta Bang III M.D.   On: 03/13/2015 07:30   Dg Fluoro Rm 1-60 Min  03/11/2015   CLINICAL DATA:  Orogastric tube placement. Unsuccessful attempt dad orogastric tube placement at bedside.  EXAM: FLOURO RM 1-60 MIN  FLUOROSCOPY TIME:  Radiation Exposure Index (as provided by the fluoroscopic device): 3.5 mGy  COMPARISON:  None.  FINDINGS: A Dobbhoff tube was inserted through the right nare into the esophagus with the metallic tip projecting over the fundus of the stomach. The tube was secured to the nose.  IMPRESSION: Dobbhoff tube with the tip projecting over the fundus of the stomach.   Electronically Signed   By: Elige Ko   On: 03/11/2015 17:05   Portable Chest Xray  03/11/2015   CLINICAL DATA:  Respiratory failure.  Intubation  EXAM: PORTABLE CHEST 1 VIEW  COMPARISON:  03/09/2015  FINDINGS: Endotracheal tube enters the right main bronchus. Recommend withdrawal 3 of 4 cm.  The right lung is hyperinflated. There is collapse in the left lower lobe.  NG tube  coiled in the hiatal hernia above the diaphragm.  Negative for heart failure.  IMPRESSION: Endotracheal tube right main bronchus, recommend withdrawal of 3-4 cm.  Collapse of left lower lobe  NG tube coiled in the hiatal hernia  These results were called by telephone at the time of interpretation on 03/11/2015 at 12:46 pm to [facility], RN , who verbally acknowledged these results.   Electronically Signed   By: Marlan Palau M.D.   On: 03/11/2015 12:47       --Wells Guiles, MD.  Pager [phone] [facility] Office Number: [phone]  Critical Care Attestation.  I have personally obtained a history, examined the patient, evaluated laboratory and imaging results, formulated the assessment and plan and placed orders. The Patient requires high complexity decision making for assessment and support, frequent evaluation and titration of therapies, application of advanced monitoring  technologies and extensive interpretation of multiple databases. The patient has critical illness that could lead imminently to failure of 1 or more organ systems and requires the highest level of physician preparedness to intervene.  Critical Care Time devoted to patient care services described in this note is 35 minutes and is exclusive of time spent in procedures.   Santiago Glad, M.D.  Stephanie Acre, M.D.  Billy Fischer, M.D

## 2015-03-13 NOTE — Progress Notes (Signed)
Speech Therapy Note: reviewed chart notes indicating pt was extubated yesterday PM but was quite agitated overnight. Currently, pt is more lethargic w/ low HR. Another NG feeding tube is being considered at this time (after pt pulled one out last night) as GI will see pt on Monday (per notes). ST will f/u when appropriate next week. Rec. Continue w/ oral care frequently to provide hygiene and oral stim.

## 2015-03-14 LAB — GLUCOSE, CAPILLARY
GLUCOSE-CAPILLARY: 84 mg/dL (ref 65–99)
GLUCOSE-CAPILLARY: 85 mg/dL (ref 65–99)
GLUCOSE-CAPILLARY: 125 mg/dL — AB (ref 65–99)
Glucose-Capillary: 114 mg/dL — ABNORMAL HIGH (ref 65–99)
Glucose-Capillary: 122 mg/dL — ABNORMAL HIGH (ref 65–99)
Glucose-Capillary: 134 mg/dL — ABNORMAL HIGH (ref 65–99)
Glucose-Capillary: 76 mg/dL (ref 65–99)
Glucose-Capillary: 98 mg/dL (ref 65–99)

## 2015-03-14 LAB — CREATININE, SERUM
CREATININE: 0.99 mg/dL (ref 0.44–1.00)
GFR calc Af Amer: 57 mL/min — ABNORMAL LOW (ref >60)
GFR calc non Af Amer: 49 mL/min — ABNORMAL LOW (ref >60)

## 2015-03-14 LAB — VANCOMYCIN, TROUGH: Vancomycin Tr: 13 ug/mL (ref 10–20)

## 2015-03-14 MED ORDER — PREDNISONE 20 MG PO TABS
20.0000 mg | ORAL_TABLET | Freq: Every day | ORAL | Status: DC
  Filled 2015-03-14: qty 1

## 2015-03-14 MED ORDER — FAMOTIDINE 40 MG/5ML PO SUSR
20.0000 mg | Freq: Every day | ORAL | Status: DC
  Filled 2015-03-14: qty 2.5

## 2015-03-14 MED ORDER — HALOPERIDOL LACTATE 5 MG/ML IJ SOLN
2.0000 mg | Freq: Every evening | INTRAMUSCULAR | Status: DC | PRN
  Administered 2015-03-14: 2 mg via INTRAVENOUS
  Filled 2015-03-14 (×2): qty 1

## 2015-03-14 MED ORDER — HYDRALAZINE HCL 20 MG/ML IJ SOLN
5.0000 mg | Freq: Four times a day (QID) | INTRAMUSCULAR | Status: DC | PRN
  Administered 2015-03-14 – 2015-03-15 (×4): 5 mg via INTRAVENOUS
  Filled 2015-03-14 (×4): qty 1

## 2015-03-14 MED ORDER — METHYLPREDNISOLONE SODIUM SUCC 40 MG IJ SOLR
20.0000 mg | Freq: Once | INTRAMUSCULAR | Status: AC
  Administered 2015-03-14: 20 mg via INTRAVENOUS
  Filled 2015-03-14: qty 1

## 2015-03-14 MED ORDER — VANCOMYCIN HCL IN DEXTROSE 750-5 MG/150ML-% IV SOLN
750.0000 mg | INTRAVENOUS | Status: DC
  Administered 2015-03-14 – 2015-03-15 (×2): 750 mg via INTRAVENOUS
  Filled 2015-03-14 (×3): qty 150

## 2015-03-14 NOTE — Progress Notes (Signed)
Brief Nutrition Follow-up  EN: RD notes dobhoff unable to placed yesterday per Nsg however plan for MD to place under fluoro on Monday. Will follow poc and make recommendations accordingly.   HIGH Care Level  Leda Quail, [place], [place] Pager [phone]

## 2015-03-14 NOTE — Progress Notes (Signed)
Sats in low to mid 80's.  Placed [place] at 4 liter with no results.  Placed non-rebreather.  Pt's sats now in upper 90's

## 2015-03-14 NOTE — Progress Notes (Signed)
Patient's blood pressure is 174/89. PRN hydralazine given

## 2015-03-14 NOTE — Progress Notes (Signed)
Patient ID: Cheyenne Boone, female   DOB: 06/29/1923, 81 y.o.   MRN: [id number] Patient ID: Cheyenne Boone, female   DOB: 30-Jul-1923, 81 y.o.   MRN: [id number] Subjective: 81 y/o f with dysphagia - possible Myasthenia developed  Acute Respiratory failure secondary to pneumonia-Patient currently . Requiring supplemental O2, lethargic this morning although did get Ativan overnight  Objective: Vital signs in last 24 hours: Temp:  [96.8 F (36 C)-98.6 F (37 C)] 98.1 F (36.7 C) (10/02 0600) Pulse Rate:  [46-94] 83 (10/02 0600) Resp:  [12-27] 20 (10/02 0600) BP: (80-175)/(38-126) 145/59 mmHg (10/02 0600) SpO2:  [93 %-100 %] 98 % (10/02 0826) FiO2 (%):  [30 %] 30 % (10/02 0826) Weight:  [52.6 kg (115 lb 15.4 oz)] 52.6 kg (115 lb 15.4 oz) (10/02 0459) Weight change: 4.4 kg (9 lb 11.2 oz)    Intake/Output from previous day: 10/01 0701 - 10/02 0700 In: 512.4 [I.V.:112.4; IV Piggyback:400] Out: 3300 [Urine:3300] Intake/Output this shift:    GENERAL:Awake-On mechanical ventilation. EYES: No pallor, no icterus. HEENT:  Endotracheal tube in place. Head atraumatic, normocephalic.  NECK: supple, no jugular venous distention. No thyroid enlargement, no tenderness.  LUNGS: clear to auscultation. No wheezing, rhonchi or crepitation. Good air entry bilaterally. CARDIOVASCULAR: regular rate and rhythm, S1, S2 normal. No tachycardia.  ABDOMEN: Soft, non-tender, normal bowel sounds, no organomegaly or mass.  EXTREMITIES: No pedal edema, cyanosis, or clubbing.  NEUROLOGIC: Lethargic  Lab Results:  Recent Labs  03/11/15 1615 03/13/15 0422  WBC 21.2* 11.9*  HGB 12.8 12.2  HCT 39.4 36.2  PLT 285 190   BMET  Recent Labs  03/11/15 1615 03/13/15 0422 03/14/15 0127  NA 135 142  --   K 3.1* 3.2*  --   CL 105 109  --   CO2 17* 26  --   GLUCOSE 228* 133*  --   BUN 19 21*  --   CREATININE 1.00 1.11* 0.99  CALCIUM 8.1* 9.0  --     Studies/Results: Dg Chest 1  View  03/13/2015   CLINICAL DATA:  Shortness of Breath  EXAM: CHEST 1 VIEW  COMPARISON:  March 11, 2015  FINDINGS: Endotracheal tube and nasogastric tube have been removed. No apparent pneumothorax. There is persistent consolidation with volume loss in the left lower lobe. There is a small left effusion. There is mild atelectasis in the medial right base. The right lung is otherwise clear. Heart is upper normal in size with pulmonary vascularity within normal limits. There is calcification in the mitral annulus. There is extensive calcification in the aorta. No adenopathy.  IMPRESSION: No pneumothorax. Persistent left lower lobe consolidation with small left effusion. New atelectasis medial right base. No change in cardiac silhouette allowing for differences in position and technique.   Electronically Signed   By: Bretta Bang III M.D.   On: 03/13/2015 07:30    Medications:  Scheduled Meds: . antiseptic oral rinse  7 mL Mouth Rinse QID  . aspirin  300 mg Rectal Daily  . chlorhexidine gluconate  15 mL Mouth Rinse BID  . enoxaparin (LOVENOX) injection  30 mg Subcutaneous Q24H  . famotidine  20 mg Per Tube BID  . fluticasone  2 spray Each Nare Daily  . free water  200 mL Per Tube BID  . Immune Globulin 10%  400 mg/kg Intravenous Q24 Hr x 5  . insulin aspart  0-9 Units Subcutaneous 6 times per day  . ipratropium-albuterol  3 mL Nebulization Q6H  .  piperacillin-tazobactam (ZOSYN)  IV  3.375 g Intravenous 3 times per day  . pravastatin  10 mg Oral Daily  . predniSONE  40 mg Oral Q breakfast  . pyridostigmine  60 mg Oral 3 times per day  . vancomycin  750 mg Intravenous Q18H   Continuous Infusions: . sodium chloride 40 mL/hr at 03/11/15 1337  . feeding supplement (JEVITY 1.5 CAL/FIBER)     PRN Meds:.ALPRAZolam, hydrALAZINE, ondansetron **OR** ondansetron (ZOFRAN) IV, senna-docusate   Assessment/Plan: 1 Acute Respiratory failure/ Pneumonia With lethargy, ABG 2 Dysphagia/ Drooping of  left eye:and speech disturbance: Possible Myasthenia. IVIG and other treatment per neurology Care per ICU and neurology team  DVT prophylaxis with Lovenox.  GI prophylaxis with IV Protonix. Full code   LOS: 5 days   MILLER,MARK F. 03/14/2015, 8:37 AM

## 2015-03-14 NOTE — Progress Notes (Signed)
Pharmacy Consult for Vancomycin/Zosyn Indication: Sepsis/possible aspiration PNA  No Known Allergies  Patient Measurements: Height:  (149.9 cm) Weight: 106 lb 4.2 oz (48.2 kg) IBW/kg (Calculated) : 43.2   Vital Signs: Temp: 98.2 F (36.8 C) (10/01 2300) Temp Source: Core (Comment) (10/01 1645) BP: 166/83 mmHg (10/01 2300) Pulse Rate: 88 (10/01 2300) Intake/Output from previous day: 10/01 0701 - 10/02 0700 In: 512.4 [I.V.:112.4; IV Piggyback:400] Out: 1600 [Urine:1600] Intake/Output from this shift: Total I/O In: 101.7 [I.V.:101.7] Out: -   Labs:  Recent Labs  03/11/15 1615 03/13/15 0422 03/14/15 0127  WBC 21.2* 11.9*  --   HGB 12.8 12.2  --   PLT 285 190  --   CREATININE 1.00 1.11* 0.99   Estimated Creatinine Clearance: 26.8 mL/min (by C-G formula based on Cr of 0.99).  Recent Labs  03/14/15 0127  VANCOTROUGH 13     Microbiology: Recent Results (from the past 720 hour(s))  Culture, blood (routine x 2)     Status: None (Preliminary result)   Collection Time: 03/11/15  1:23 PM  Result Value Ref Range Status   Specimen Description BLOOD RIGHT ARM  Final   Special Requests BOTTLES DRAWN AEROBIC AND ANAEROBIC 2CC  Final   Culture NO GROWTH 2 DAYS  Final   Report Status PENDING  Incomplete  Culture, blood (routine x 2)     Status: None (Preliminary result)   Collection Time: 03/11/15  1:23 PM  Result Value Ref Range Status   Specimen Description BLOOD LEFT ASSIST CONTROL  Final   Special Requests   Final    BOTTLES DRAWN AEROBIC AND ANAEROBIC 4CC ANAERO 7CC AERO   Culture NO GROWTH 2 DAYS  Final   Report Status PENDING  Incomplete  MRSA PCR Screening     Status: None   Collection Time: 03/11/15  2:00 PM  Result Value Ref Range Status   MRSA by PCR  NEGATIVE Final    RESULTS INVALID. NOTIFIED JAMIE SMITH GREGORY AT 1730 FOR RECOLLECT MSS  Culture, bal-quantitative     Status: None (Preliminary result)   Collection Time: 03/11/15  6:45 PM  Result  Value Ref Range Status   Specimen Description BRONCHIAL ALVEOLAR LAVAGE  Final   Special Requests Normal  Final   Gram Stain   Final    FEW WBC SEEN MODERATE GRAM POSITIVE COCCI FEW GRAM NEGATIVE RODS GOOD SPECIMEN - 80-90% WBCS    Culture HOLDING FOR POSSIBLE PATHOGEN  Final   Report Status PENDING  Incomplete    Medical History: Past Medical History  Diagnosis Date  . Hypertension   . Hypercholesteremia   . Stroke     Medications:  Scheduled:  . antiseptic oral rinse  7 mL Mouth Rinse QID  . aspirin  300 mg Rectal Daily  . chlorhexidine gluconate  15 mL Mouth Rinse BID  . enoxaparin (LOVENOX) injection  30 mg Subcutaneous Q24H  . famotidine  20 mg Per Tube BID  . fluticasone  2 spray Each Nare Daily  . free water  200 mL Per Tube BID  . Immune Globulin 10%  400 mg/kg Intravenous Q24 Hr x 5  . insulin aspart  0-9 Units Subcutaneous 6 times per day  . ipratropium-albuterol  3 mL Nebulization Q6H  . piperacillin-tazobactam (ZOSYN)  IV  3.375 g Intravenous 3 times per day  . pravastatin  10 mg Oral Daily  . predniSONE  40 mg Oral Q breakfast  . pyridostigmine  60 mg Oral 3 times per  day  . vancomycin  500 mg Intravenous Q18H  . vancomycin  750 mg Intravenous Q18H   Infusions:  . sodium chloride 40 mL/hr at 03/11/15 1337  . feeding supplement (JEVITY 1.5 CAL/FIBER)     PRN: ALPRAZolam, ondansetron **OR** ondansetron (ZOFRAN) IV, senna-docusate  Assessment: 81 y/o F admitted with dysphagia developing acute respiratory failure ordered empiric abx for sepsis and possible aspiration PNA.   Goal of Therapy:  Vancomycin trough level 15-20 mcg/ml  Plan:  Will continue current therapy. Trough scheduled with the 5th dose of vancomycin. Will f/u renal function and culture results.    1002 0127 vancomycin trough 13 mcg/mL. Adjusted Ke 0.042 hr-1. Increase dose to 750 mg IV Q18H (predicted trough 19 mcg/mL) and will follow up trough on 10/5.  Carola Frost,  Pharm.D. Clinical Pharmacist 03/14/2015,2:20 AM

## 2015-03-14 NOTE — Progress Notes (Signed)
Patient awake. She does not follow commands she pulled her oxygen off and O2 sats decreased to 70s

## 2015-03-14 NOTE — Progress Notes (Signed)
NEUROLOGY NOTE  S: Still no NGT but less agitated today  ROS unobtainable to confusion  O: 98.2    136/72     77   23 Mild distress, nl weight Normocephalic, oropharynx clear Supple, no JVD Coarse BS, decreased movement RRR, no murmur No C/C/E   Alert but severe dysarthria, follows commands PERRLA, +corneals good, good cough wihtdrawals equally  A/P: 1. Probable myasthenia gravis- still improved strength but pt can return to respiratory failure at anytime 2. Encephalopathy- resolved - Continue IVIG 0.4mg /kg daily x 5 days even though PLEX is better for MG - Avoid HALDOL for agitation, PRN ativan or Geodon ok - Still needs Dobhoff again - Continue Mestinon  TID PO - Continue Prednisone  daily PO - Avoid flouroquinolones and imipenums when treating aspiration because these worsen MG - Labs are pending - Will follow closely  - Would hold off on transfer unless pt can not get extubated; If breathing is still weak, would then perform plasma exchange which will have to be done at outside hospital  32 minutes of critical care time spent, examining pt, reviewing chart and coordinating care

## 2015-03-14 NOTE — Progress Notes (Addendum)
[facility] [place] Critical Care Medicine Progess Note    ASSESSMENT/PLAN   81 year old female with dysphagia, dehydration, malnutrition, transferred to the ICU for acute respiratory distress, concern for possible aspiration. Concern for possible myasthenia gravis, currently on Mestinon. Patient now extubated courses but, given by delirium.  PULMONARY OETT 7.32mm A: Acute hypoxic respiratory failure Status post Ventilator-dependent; the patient was extubated yesterday. Her postop course, complicted by agitation. Overnight, the patient required increasing levels of oxygen. Suspect that she has some degree of atelectasis postextubation, in addition to mucosal secretions which were noted on previous bronchoscopy. Copious mucosal secretions, status post bronchoscopy. Bronchial cultures are currently pending. Patient is currently on vancomycin and Zosyn, started September 29  P:  -When necessary DuoNeb's -Empiric antibiotics -Await bronchoscopy culture results, we'll wean antibiotics as tolerated.  -Repeat chest x-ray  CARDIOVASCULAR CVL - R Fem Developed bradycardia with very small doses of Precedex therefore discontinued.  RENAL A:  Dehydration, doing better  P:  - cont with IVF - cont with electrolyte monitoring.   GASTROINTESTINAL A:  Dysphagia P:  -The patient had pulled out her previous NG tube, which have been placed by interventional radiology, as we are unable to place it in the ICU. Here now waiting interventional radiology placement of the tube tomorrow. Can restart feeding at that time as well as change IV medications to oral. - GI following, plans for possible upper endoscopy  - PPI, will change to H2 blocker.  HEMATOLOGIC Leukocytosis. -Likely secondary to steroids, currently at 40 mg once daily. Will decrease, as this may be contributing to the patient's agitation and delirium. -Continue enoxaparin for DVT prophylaxis.  INFECTIOUS A:  Sepsis ?  Aspiration Pneumonia P:  Thick secretions noted during the intubation Acute respiratory distress and failure with subsequent dysphagia and difficultly swallowing, concern for aspiration. -Continue vancomycin and Zosyn. Await bronchoscopy cultures.  Lines: R Fem CVL 9/29, patient pulled this out on 03/13/2015.  Cultures BCx2 9/29  Antibiotics Abx: Vanc/Zosyn, start date 9/29  ENDOCRINE - ICU hypo/hyperglycemia protocol  NEUROLOGIC Intubated and sedated Possible myasthenia gravis. Neurology following. Currently on pyridostigmine 60 mg every 8 hours. Agitation and delirium, possibly secondary to sundowning and exacerbated by steroids. P:  The patient is currently sedated after receiving a small dose of Ativan overnight for treatment of delirium. -We'll start the patient on IV Haldol 2 mg IV daily at bedtime, may repeat 1. -Intolerant of Precedex due to bradycardia, even at very low doses.  Social  - Patient lives next door, to an individual who is her caretaker. -Patient has 2 daughters who are the health care power of attorney, currently she is a full code  Chest x-ray images and report reviewed. There is mild bibasilar atelectasis consistent with post extubation changes. ---------------------------------------   ----------------------------------------   Name: Cheyenne Boone MRN: [id number] DOB: August 07, 1923    ADMISSION DATE:  03/09/2015    CHIEF COMPLAINT:  Dyspnea    SUBJECTIVE:  The patient is sleepy. She is noncommunicative. She is combative with staff overnight, she received IV Ativan overnight. The patient had pulled out her lines yesterday morning.   Review of Systems:  Patient has delirium, can not provide history or review of systems.     VITAL SIGNS: Temp:  [96.8 F (36 C)-98.6 F (37 C)] 98.1 F (36.7 C) (10/02 0600) Pulse Rate:  [46-94] 83 (10/02 0600) Resp:  [12-27] 20 (10/02 0600) BP: (80-175)/(38-126) 145/59 mmHg (10/02 0600) SpO2:   [93 %-100 %] 98 % (10/02 0826) FiO2 (%):  [  30 %] 30 % (10/02 0826) Weight:  [52.6 kg (115 lb 15.4 oz)] 52.6 kg (115 lb 15.4 oz) (10/02 0459) HEMODYNAMICS:   VENTILATOR SETTINGS: Vent Mode:  [-]  FiO2 (%):  [30 %] 30 % INTAKE / OUTPUT:  Intake/Output Summary (Last 24 hours) at 03/14/15 0947 Last data filed at 03/14/15 0500  Gross per 24 hour  Intake 312.38 ml  Output   2300 ml  Net -1987.62 ml    PHYSICAL EXAMINATION: Physical Examination:   VS: BP 145/59 mmHg  Pulse 83  Temp(Src) 98.1 F (36.7 C) (Core (Comment))  Resp 20  Ht  (1.499 m)  Wt 52.6 kg (115 lb 15.4 oz)  BMI 23.41 kg/m2  SpO2 98%  General Appearance: No distress  Neuro:without focal findings, mental status normal. HEENT: PERRLA, EOM intact. Pulmonary: normal breath sounds   CardiovascularNormal S1,S2.  No m/r/g.   Abdomen: Benign, Soft, non-tender. Renal:  No costovertebral tenderness  GU:  Not performed at this time. Endocrine: No evident thyromegaly. Skin:   warm, no rashes, no ecchymosis  Extremities: normal, no cyanosis, clubbing.   LABS:   LABORATORY PANEL:   CBC  Recent Labs Lab 03/13/15 0422  WBC 11.9*  HGB 12.2  HCT 36.2  PLT 190    Chemistries   Recent Labs Lab 03/11/15 1615 03/13/15 0422 03/14/15 0127  NA 135 142  --   K 3.1* 3.2*  --   CL 105 109  --   CO2 17* 26  --   GLUCOSE 228* 133*  --   BUN 19 21*  --   CREATININE 1.00 1.11* 0.99  CALCIUM 8.1* 9.0  --   MG  --  1.7  --   PHOS  --  2.2*  --   AST 25  --   --   ALT 12*  --   --   ALKPHOS 94  --   --   BILITOT 1.6*  --   --      Recent Labs Lab 03/13/15 0940 03/13/15 1604 03/13/15 1944 03/14/15 0001 03/14/15 0404 03/14/15 0755  GLUCAP 135* 98 87 76 84 85    Recent Labs Lab 03/12/15 1600 03/13/15 0350 03/14/15 0920  PHART 7.32* 7.43 7.46*  PCO2ART 42 35 37  PO2ART 107 171* 50*    Recent Labs Lab 03/11/15 1615  AST 25  ALT 12*  ALKPHOS 94  BILITOT 1.6*  ALBUMIN 3.5     Cardiac Enzymes  Recent Labs Lab 03/12/15 0520  TROPONINI <0.03    RADIOLOGY:  Dg Chest 1 View  03/13/2015   CLINICAL DATA:  Shortness of Breath  EXAM: CHEST 1 VIEW  COMPARISON:  March 11, 2015  FINDINGS: Endotracheal tube and nasogastric tube have been removed. No apparent pneumothorax. There is persistent consolidation with volume loss in the left lower lobe. There is a small left effusion. There is mild atelectasis in the medial right base. The right lung is otherwise clear. Heart is upper normal in size with pulmonary vascularity within normal limits. There is calcification in the mitral annulus. There is extensive calcification in the aorta. No adenopathy.  IMPRESSION: No pneumothorax. Persistent left lower lobe consolidation with small left effusion. New atelectasis medial right base. No change in cardiac silhouette allowing for differences in position and technique.   Electronically Signed   By: Bretta Bang III M.D.   On: 03/13/2015 07:30       --Wells Guiles, MD.  Pager [phone] [facility]  Number: [phone]  Critical Care Attestation.  I have personally obtained a history, examined the patient, evaluated laboratory and imaging results, formulated the assessment and plan and placed orders. The Patient requires high complexity decision making for assessment and support, frequent evaluation and titration of therapies, application of advanced monitoring technologies and extensive interpretation of multiple databases. The patient has critical illness that could lead imminently to failure of 1 or more organ systems and requires the highest level of physician preparedness to intervene.  Critical Care Time devoted to patient care services described in this note is 35 minutes and is exclusive of time spent in procedures.   Santiago Glad, M.D.  Stephanie Acre, M.D.  Billy Fischer, M.D

## 2015-03-14 NOTE — Progress Notes (Signed)
Systolic BP trending upward.  No PRN medications noted.  Contacted E-link.  New orders entered.  Will administer and continue to monitor.

## 2015-03-14 NOTE — Progress Notes (Signed)
eLink Physician-Brief Progress Note Patient Name: Cheyenne Boone DOB: December 21, 1923 MRN: [id number]   Date of Service  03/14/2015  HPI/Events of Note  Called for hypertension. BP 175/85. Unable to take her home HTN meds as she is NPO.  eICU Interventions  Order hydralazine PRN.     Intervention Category Intermediate Interventions: Hypertension - evaluation and management  Shaterria Sager 03/14/2015, 2:30 AM

## 2015-03-15 ENCOUNTER — Inpatient Hospital Stay: Payer: Commercial Managed Care - HMO

## 2015-03-15 ENCOUNTER — Encounter: Payer: Self-pay | Admitting: Internal Medicine

## 2015-03-15 ENCOUNTER — Inpatient Hospital Stay (HOSPITAL_COMMUNITY)
Admission: AD | Admit: 2015-03-15 | Discharge: 2015-04-07 | DRG: 056 | Disposition: A | Discharge status: Hospice | Payer: Commercial Managed Care - HMO | Source: Other Acute Inpatient Hospital | Attending: Internal Medicine | Admitting: Internal Medicine

## 2015-03-15 ENCOUNTER — Inpatient Hospital Stay (HOSPITAL_COMMUNITY): Payer: Commercial Managed Care - HMO

## 2015-03-15 DIAGNOSIS — B370 Candidal stomatitis: Secondary | ICD-10-CM | POA: Diagnosis not present

## 2015-03-15 DIAGNOSIS — J189 Pneumonia, unspecified organism: Secondary | ICD-10-CM | POA: Diagnosis not present

## 2015-03-15 DIAGNOSIS — I4891 Unspecified atrial fibrillation: Secondary | ICD-10-CM | POA: Diagnosis not present

## 2015-03-15 DIAGNOSIS — Z7951 Long term (current) use of inhaled steroids: Secondary | ICD-10-CM | POA: Diagnosis not present

## 2015-03-15 DIAGNOSIS — E43 Unspecified severe protein-calorie malnutrition: Secondary | ICD-10-CM | POA: Diagnosis not present

## 2015-03-15 DIAGNOSIS — Z79899 Other long term (current) drug therapy: Secondary | ICD-10-CM | POA: Diagnosis not present

## 2015-03-15 DIAGNOSIS — E876 Hypokalemia: Secondary | ICD-10-CM | POA: Diagnosis present

## 2015-03-15 DIAGNOSIS — E873 Alkalosis: Secondary | ICD-10-CM | POA: Diagnosis present

## 2015-03-15 DIAGNOSIS — L299 Pruritus, unspecified: Secondary | ICD-10-CM | POA: Diagnosis not present

## 2015-03-15 DIAGNOSIS — R011 Cardiac murmur, unspecified: Secondary | ICD-10-CM | POA: Diagnosis not present

## 2015-03-15 DIAGNOSIS — T17990A Other foreign object in respiratory tract, part unspecified in causing asphyxiation, initial encounter: Secondary | ICD-10-CM | POA: Diagnosis not present

## 2015-03-15 DIAGNOSIS — H409 Unspecified glaucoma: Secondary | ICD-10-CM | POA: Diagnosis present

## 2015-03-15 DIAGNOSIS — I10 Essential (primary) hypertension: Secondary | ICD-10-CM | POA: Diagnosis present

## 2015-03-15 DIAGNOSIS — J9601 Acute respiratory failure with hypoxia: Secondary | ICD-10-CM

## 2015-03-15 DIAGNOSIS — G7000 Myasthenia gravis without (acute) exacerbation: Secondary | ICD-10-CM | POA: Diagnosis not present

## 2015-03-15 DIAGNOSIS — E785 Hyperlipidemia, unspecified: Secondary | ICD-10-CM | POA: Diagnosis present

## 2015-03-15 DIAGNOSIS — N179 Acute kidney failure, unspecified: Secondary | ICD-10-CM

## 2015-03-15 DIAGNOSIS — R0600 Dyspnea, unspecified: Secondary | ICD-10-CM | POA: Diagnosis not present

## 2015-03-15 DIAGNOSIS — T426X5A Adverse effect of other antiepileptic and sedative-hypnotic drugs, initial encounter: Secondary | ICD-10-CM | POA: Diagnosis not present

## 2015-03-15 DIAGNOSIS — J9690 Respiratory failure, unspecified, unspecified whether with hypoxia or hypercapnia: Secondary | ICD-10-CM

## 2015-03-15 DIAGNOSIS — J9600 Acute respiratory failure, unspecified whether with hypoxia or hypercapnia: Secondary | ICD-10-CM

## 2015-03-15 DIAGNOSIS — R451 Restlessness and agitation: Secondary | ICD-10-CM | POA: Diagnosis not present

## 2015-03-15 DIAGNOSIS — I469 Cardiac arrest, cause unspecified: Secondary | ICD-10-CM | POA: Diagnosis not present

## 2015-03-15 DIAGNOSIS — R739 Hyperglycemia, unspecified: Secondary | ICD-10-CM | POA: Diagnosis present

## 2015-03-15 DIAGNOSIS — G709 Myoneural disorder, unspecified: Secondary | ICD-10-CM

## 2015-03-15 DIAGNOSIS — Z515 Encounter for palliative care: Secondary | ICD-10-CM | POA: Diagnosis not present

## 2015-03-15 DIAGNOSIS — Z452 Encounter for adjustment and management of vascular access device: Secondary | ICD-10-CM | POA: Diagnosis not present

## 2015-03-15 DIAGNOSIS — Z66 Do not resuscitate: Secondary | ICD-10-CM | POA: Diagnosis not present

## 2015-03-15 DIAGNOSIS — Z4659 Encounter for fitting and adjustment of other gastrointestinal appliance and device: Secondary | ICD-10-CM

## 2015-03-15 DIAGNOSIS — D72829 Elevated white blood cell count, unspecified: Secondary | ICD-10-CM | POA: Diagnosis not present

## 2015-03-15 DIAGNOSIS — R34 Anuria and oliguria: Secondary | ICD-10-CM | POA: Diagnosis not present

## 2015-03-15 DIAGNOSIS — J690 Pneumonitis due to inhalation of food and vomit: Secondary | ICD-10-CM | POA: Diagnosis present

## 2015-03-15 DIAGNOSIS — K449 Diaphragmatic hernia without obstruction or gangrene: Secondary | ICD-10-CM | POA: Diagnosis present

## 2015-03-15 DIAGNOSIS — G7001 Myasthenia gravis with (acute) exacerbation: Secondary | ICD-10-CM | POA: Diagnosis present

## 2015-03-15 DIAGNOSIS — E8770 Fluid overload, unspecified: Secondary | ICD-10-CM

## 2015-03-15 DIAGNOSIS — T380X5A Adverse effect of glucocorticoids and synthetic analogues, initial encounter: Secondary | ICD-10-CM | POA: Diagnosis not present

## 2015-03-15 DIAGNOSIS — Z8673 Personal history of transient ischemic attack (TIA), and cerebral infarction without residual deficits: Secondary | ICD-10-CM

## 2015-03-15 DIAGNOSIS — R1310 Dysphagia, unspecified: Secondary | ICD-10-CM

## 2015-03-15 DIAGNOSIS — F419 Anxiety disorder, unspecified: Secondary | ICD-10-CM | POA: Diagnosis not present

## 2015-03-15 DIAGNOSIS — Z87898 Personal history of other specified conditions: Secondary | ICD-10-CM | POA: Diagnosis not present

## 2015-03-15 DIAGNOSIS — I959 Hypotension, unspecified: Secondary | ICD-10-CM | POA: Diagnosis not present

## 2015-03-15 DIAGNOSIS — D649 Anemia, unspecified: Secondary | ICD-10-CM | POA: Diagnosis not present

## 2015-03-15 DIAGNOSIS — E162 Hypoglycemia, unspecified: Secondary | ICD-10-CM | POA: Diagnosis not present

## 2015-03-15 DIAGNOSIS — N170 Acute kidney failure with tubular necrosis: Secondary | ICD-10-CM | POA: Diagnosis not present

## 2015-03-15 LAB — BASIC METABOLIC PANEL
Anion gap: 9 (ref 5–15)
Anion gap: 8 (ref 5–15)
BUN: 13 mg/dL (ref 6–20)
BUN: 15 mg/dL (ref 6–20)
CALCIUM: 9.1 mg/dL (ref 8.9–10.3)
CALCIUM: 8.9 mg/dL (ref 8.9–10.3)
CO2: 25 mmol/L (ref 22–32)
CO2: 25 mmol/L (ref 22–32)
CREATININE: 0.9 mg/dL (ref 0.44–1.00)
CREATININE: 0.94 mg/dL (ref 0.44–1.00)
Chloride: 107 mmol/L (ref 101–111)
Chloride: 102 mmol/L (ref 101–111)
GFR calc Af Amer: >60 mL/min (ref >60)
GFR calc Af Amer: >60 mL/min (ref >60)
GFR, EST NON AFRICAN AMERICAN: 55 mL/min — AB (ref >60)
GFR, EST NON AFRICAN AMERICAN: 53 mL/min — AB (ref >60)
GLUCOSE: 107 mg/dL — AB (ref 65–99)
Glucose, Bld: 135 mg/dL — ABNORMAL HIGH (ref 65–99)
Potassium: 2.9 mmol/L — CL (ref 3.5–5.1)
Potassium: 3.3 mmol/L — ABNORMAL LOW (ref 3.5–5.1)
SODIUM: 141 mmol/L (ref 135–145)
Sodium: 135 mmol/L (ref 135–145)

## 2015-03-15 LAB — CARBOXYHEMOGLOBIN
CARBOXYHEMOGLOBIN: 1.1 % (ref 0.5–1.5)
METHEMOGLOBIN: 0.7 % (ref 0.0–1.5)
O2 SAT: 78 %
TOTAL HEMOGLOBIN: 13.1 g/dL (ref 12.0–16.0)

## 2015-03-15 LAB — BLOOD GAS, ARTERIAL
ACID-BASE EXCESS: 2.5 mmol/L (ref 0.0–3.0)
ALLENS TEST (PASS/FAIL): POSITIVE — AB
Acid-base deficit: 4.3 mmol/L — ABNORMAL HIGH (ref 0.0–2.0)
BICARBONATE: 26.3 meq/L (ref 21.0–28.0)
Bicarbonate: 21.6 mEq/L (ref 21.0–28.0)
Drawn by: 187461
FIO2: 0.3
FIO2: 0.21
MODE: POSITIVE
O2 SAT: 97.7 %
O2 SAT: 87.2 %
PATIENT TEMPERATURE: 37
PCO2 ART: 42 mmHg (ref 32.0–48.0)
PCO2 ART: 37 mmHg (ref 32.0–48.0)
PEEP/CPAP: 5 cmH2O
PH ART: 7.32 — AB (ref 7.350–7.450)
PO2 ART: 107 mmHg (ref 83.0–108.0)
PO2 ART: 50 mmHg — AB (ref 83.0–108.0)
Patient temperature: 37
Pressure support: 5 cmH2O
pH, Arterial: 7.46 — ABNORMAL HIGH (ref 7.350–7.450)

## 2015-03-15 LAB — CBC
HCT: 40.2 % (ref 35.0–47.0)
Hemoglobin: 13.7 g/dL (ref 12.0–16.0)
MCH: 31 pg (ref 26.0–34.0)
MCHC: 34.2 g/dL (ref 32.0–36.0)
MCV: 90.7 fL (ref 80.0–100.0)
PLATELETS: 255 10*3/uL (ref 150–440)
RBC: 4.43 MIL/uL (ref 3.80–5.20)
RDW: 14.4 % (ref 11.5–14.5)
WBC: 9.6 10*3/uL (ref 3.6–11.0)

## 2015-03-15 LAB — CBC WITH DIFFERENTIAL/PLATELET
BASOS ABS: 0 10*3/uL (ref 0.0–0.1)
BASOS PCT: 0 %
EOS ABS: 0.1 10*3/uL (ref 0.0–0.7)
EOS PCT: 1 %
HCT: 38.3 % (ref 36.0–46.0)
Hemoglobin: 13 g/dL (ref 12.0–15.0)
Lymphocytes Relative: 7 %
Lymphs Abs: 0.7 10*3/uL (ref 0.7–4.0)
MCH: 30.9 pg (ref 26.0–34.0)
MCHC: 33.9 g/dL (ref 30.0–36.0)
MCV: 91 fL (ref 78.0–100.0)
MONO ABS: 1.2 10*3/uL — AB (ref 0.1–1.0)
Monocytes Relative: 10 %
Neutro Abs: 9.2 10*3/uL — ABNORMAL HIGH (ref 1.7–7.7)
Neutrophils Relative %: 82 %
PLATELETS: 275 10*3/uL (ref 150–400)
RBC: 4.21 MIL/uL (ref 3.87–5.11)
RDW: 14.7 % (ref 11.5–15.5)
WBC: 11.3 10*3/uL — AB (ref 4.0–10.5)

## 2015-03-15 LAB — URINALYSIS, ROUTINE W REFLEX MICROSCOPIC
GLUCOSE, UA: NEGATIVE mg/dL
HGB URINE DIPSTICK: NEGATIVE
KETONES UR: 15 mg/dL — AB
Leukocytes, UA: NEGATIVE
Nitrite: NEGATIVE
PROTEIN: 30 mg/dL — AB
Specific Gravity, Urine: 1.025 (ref 1.005–1.030)
UROBILINOGEN UA: 0.2 mg/dL (ref 0.0–1.0)
pH: 5.5 (ref 5.0–8.0)

## 2015-03-15 LAB — GLUCOSE, CAPILLARY
GLUCOSE-CAPILLARY: 120 mg/dL — AB (ref 65–99)
GLUCOSE-CAPILLARY: 128 mg/dL — AB (ref 65–99)
GLUCOSE-CAPILLARY: 105 mg/dL — AB (ref 65–99)
Glucose-Capillary: 149 mg/dL — ABNORMAL HIGH (ref 65–99)

## 2015-03-15 LAB — URINE MICROSCOPIC-ADD ON

## 2015-03-15 LAB — CULTURE, BAL-QUANTITATIVE W GRAM STAIN: Special Requests: NORMAL

## 2015-03-15 LAB — MAGNESIUM: Magnesium: 1.8 mg/dL (ref 1.7–2.4)

## 2015-03-15 LAB — PHOSPHORUS: Phosphorus: 2.4 mg/dL — ABNORMAL LOW (ref 2.5–4.6)

## 2015-03-15 MED ORDER — VECURONIUM BROMIDE 10 MG IV SOLR: 10.0000 mg | Freq: Once | INTRAVENOUS | Status: DC

## 2015-03-15 MED ORDER — MIDAZOLAM HCL 2 MG/2ML IJ SOLN
INTRAMUSCULAR | Status: AC
  Filled 2015-03-15: qty 2

## 2015-03-15 MED ORDER — PYRIDOSTIGMINE BROMIDE 10 MG/2ML IV SOLN
2.0000 mg | Freq: Four times a day (QID) | INTRAVENOUS | Status: DC
  Administered 2015-03-15: 2 mg via INTRAMUSCULAR
  Filled 2015-03-15 (×3): qty 0.4

## 2015-03-15 MED ORDER — POTASSIUM CHLORIDE 10 MEQ/100ML IV SOLN
10.0000 meq | INTRAVENOUS | Status: AC
Start: 2015-03-15 — End: 2015-03-15
  Administered 2015-03-15 (×4): 10 meq via INTRAVENOUS
  Filled 2015-03-15 (×4): qty 100

## 2015-03-15 MED ORDER — HALOPERIDOL LACTATE 5 MG/ML IJ SOLN
2.0000 mg | Freq: Four times a day (QID) | INTRAMUSCULAR | Status: DC | PRN
  Administered 2015-03-15: 2 mg via INTRAVENOUS

## 2015-03-15 MED ORDER — SODIUM CHLORIDE 0.9 % IV SOLN
250.0000 mL | INTRAVENOUS | Status: DC | PRN
  Administered 2015-03-17: 250 mL via INTRAVENOUS

## 2015-03-15 MED ORDER — SODIUM CHLORIDE 0.9 % IV SOLN
INTRAVENOUS | Status: DC
  Administered 2015-03-15 – 2015-03-17 (×4): via INTRAVENOUS

## 2015-03-15 MED ORDER — FENTANYL 2500MCG IN NS 250ML (10MCG/ML) PREMIX INFUSION
25.0000 ug/h | INTRAVENOUS | Status: DC
  Administered 2015-03-15: 25 ug/h via INTRAVENOUS
  Filled 2015-03-15: qty 250

## 2015-03-15 MED ORDER — FENTANYL CITRATE (PF) 100 MCG/2ML IJ SOLN
100.0000 ug | Freq: Once | INTRAMUSCULAR | Status: AC
  Administered 2015-03-15: 100 ug via INTRAVENOUS

## 2015-03-15 MED ORDER — STERILE WATER FOR INJECTION IJ SOLN
INTRAMUSCULAR | Status: AC
  Administered 2015-03-15: 13:00:00
  Filled 2015-03-15: qty 10

## 2015-03-15 MED ORDER — SENNOSIDES-DOCUSATE SODIUM 8.6-50 MG PO TABS: 1.0000 | ORAL_TABLET | Freq: Two times a day (BID) | ORAL | Status: DC

## 2015-03-15 MED ORDER — POTASSIUM PHOSPHATES 15 MMOLE/5ML IV SOLN
30.0000 mmol | Freq: Once | INTRAVENOUS | Status: DC
  Administered 2015-03-15: 30 mmol via INTRAVENOUS
  Filled 2015-03-15: qty 10

## 2015-03-15 MED ORDER — PANTOPRAZOLE SODIUM 40 MG IV SOLR
40.0000 mg | Freq: Every day | INTRAVENOUS | Status: DC
  Administered 2015-03-15 – 2015-03-20 (×6): 40 mg via INTRAVENOUS
  Filled 2015-03-15 (×7): qty 40

## 2015-03-15 MED ORDER — MIDAZOLAM HCL 2 MG/2ML IJ SOLN
2.0000 mg | Freq: Once | INTRAMUSCULAR | Status: AC
Start: 2015-03-15 — End: 2015-03-15
  Administered 2015-03-15: 2 mg via INTRAVENOUS

## 2015-03-15 MED ORDER — FENTANYL CITRATE (PF) 100 MCG/2ML IJ SOLN
INTRAMUSCULAR | Status: AC
  Filled 2015-03-15: qty 2

## 2015-03-15 MED ORDER — HEPARIN SODIUM (PORCINE) 5000 UNIT/ML IJ SOLN
5000.0000 [IU] | Freq: Three times a day (TID) | INTRAMUSCULAR | Status: DC
  Administered 2015-03-16 – 2015-03-27 (×33): 5000 [IU] via SUBCUTANEOUS
  Filled 2015-03-15 (×41): qty 1

## 2015-03-15 MED ORDER — VECURONIUM BROMIDE 10 MG IV SOLR
INTRAVENOUS | Status: AC
  Administered 2015-03-15: 13:00:00
  Filled 2015-03-15: qty 10

## 2015-03-15 NOTE — Progress Notes (Signed)
Pharmacy Consult for Vancomycin/Zosyn Indication: Sepsis/possible aspiration PNA  No Known Allergies  Patient Measurements: Height:  (149.9 cm) Weight: 115 lb 15.4 oz (52.6 kg) IBW/kg (Calculated) : 43.2   Vital Signs: Temp: 97 F (36.1 C) (10/03 0600) BP: 153/83 mmHg (10/03 0700) Pulse Rate: 100 (10/03 0700) Intake/Output from previous day: 10/02 0701 - 10/03 0700 In: 1510 [I.V.:1160; IV Piggyback:350] Out: 1610 [Urine:1610] Intake/Output from this shift:    Labs:  Recent Labs  03/13/15 0422 03/14/15 0127 03/15/15 0611  WBC 11.9*  --  9.6  HGB 12.2  --  13.7  PLT 190  --  255  CREATININE 1.11* 0.99 0.90   Estimated Creatinine Clearance: 32.1 mL/min (by C-G formula based on Cr of 0.9).  Recent Labs  03/14/15 0127  VANCOTROUGH 13     Microbiology: Recent Results (from the past 720 hour(s))  Culture, blood (routine x 2)     Status: None (Preliminary result)   Collection Time: 03/11/15  1:23 PM  Result Value Ref Range Status   Specimen Description BLOOD RIGHT ARM  Final   Special Requests BOTTLES DRAWN AEROBIC AND ANAEROBIC 2CC  Final   Culture NO GROWTH 3 DAYS  Final   Report Status PENDING  Incomplete  Culture, blood (routine x 2)     Status: None (Preliminary result)   Collection Time: 03/11/15  1:23 PM  Result Value Ref Range Status   Specimen Description BLOOD LEFT ASSIST CONTROL  Final   Special Requests   Final    BOTTLES DRAWN AEROBIC AND ANAEROBIC 4CC ANAERO 7CC AERO   Culture NO GROWTH 3 DAYS  Final   Report Status PENDING  Incomplete  MRSA PCR Screening     Status: None   Collection Time: 03/11/15  2:00 PM  Result Value Ref Range Status   MRSA by PCR  NEGATIVE Final    RESULTS INVALID. NOTIFIED JAMIE SMITH GREGORY AT 1730 FOR RECOLLECT MSS  Culture, bal-quantitative     Status: None (Preliminary result)   Collection Time: 03/11/15  6:45 PM  Result Value Ref Range Status   Specimen Description BRONCHIAL ALVEOLAR LAVAGE  Final   Special  Requests Normal  Final   Gram Stain   Final    FEW WBC SEEN MODERATE GRAM POSITIVE COCCI FEW GRAM NEGATIVE RODS GOOD SPECIMEN - 80-90% WBCS    Culture HOLDING FOR POSSIBLE PATHOGEN  Final   Report Status PENDING  Incomplete    Medical History: Past Medical History  Diagnosis Date  . Hypertension   . Hypercholesteremia   . Stroke     Medications:  Scheduled:  . antiseptic oral rinse  7 mL Mouth Rinse QID  . aspirin  300 mg Rectal Daily  . chlorhexidine gluconate  15 mL Mouth Rinse BID  . enoxaparin (LOVENOX) injection  30 mg Subcutaneous Q24H  . famotidine  20 mg Per Tube QHS  . fluticasone  2 spray Each Nare Daily  . free water  200 mL Per Tube BID  . haloperidol lactate  2 mg Intravenous QHS,MR X 1  . Immune Globulin 10%  400 mg/kg Intravenous Q24 Hr x 5  . insulin aspart  0-9 Units Subcutaneous 6 times per day  . ipratropium-albuterol  3 mL Nebulization Q6H  . piperacillin-tazobactam (ZOSYN)  IV  3.375 g Intravenous 3 times per day  . potassium chloride  10 mEq Intravenous Q1 Hr x 4  . pravastatin  10 mg Oral Daily  . predniSONE  20 mg Oral Q  breakfast  . pyridostigmine  60 mg Oral 3 times per day  . vancomycin  750 mg Intravenous Q18H   Infusions:  . sodium chloride 40 mL/hr at 03/14/15 1350  . feeding supplement (JEVITY 1.5 CAL/FIBER)     PRN: ALPRAZolam, hydrALAZINE, ondansetron **OR** ondansetron (ZOFRAN) IV, senna-docusate  Assessment: 81 y/o F admitted with dysphagia developing acute respiratory failure ordered empiric abx for sepsis and possible aspiration PNA.   Goal of Therapy:  Vancomycin trough level 15-20 mcg/ml  Plan:  Will continue current therapy. Trough scheduled with the 5th dose of vancomycin. Will f/u renal function and culture results.    1002 0127 vancomycin trough 13 mcg/mL. Adjusted Ke 0.042 hr-1. Increase dose to 750 mg IV Q18H (predicted trough 19 mcg/mL) and will follow up trough on 10/5.  Merin Borjon D, Pharm.D. Clinical  Pharmacist 03/15/2015,8:40 AM

## 2015-03-15 NOTE — Progress Notes (Signed)
   03/15/15 0940  Clinical Encounter Type  Visited With Family  Visit Type Follow-up;Spiritual support  Consult/Referral To Chaplain  Spiritual Encounters  Spiritual Needs Emotional  Chaplain walked into the unit and walked into the patient's daughter. Offered her a listening and empathic ear.  Chaplain Estie Sproule A. Bentlie Catanzaro Ext. [phone]

## 2015-03-15 NOTE — Discharge Summary (Signed)
Physician Discharge Summary  Cheyenne Boone IHK:[id number] DOB: Aug 02, 1923 DOA: 03/09/2015  PCP: Barbette Reichmann, MD  Admit date: 03/09/2015 Discharge date: 03/15/2015  Time spent: 45 minutes   Discharge Diagnoses:  1 Acute Respiratory Failure secondary to Pneumonia 2 Possible Myasthenia Gravis  3 Dysphagia  4 Weakness    Discharge Condition: Critical  Diet recommendation:   Filed Weights   03/13/15 0500 03/14/15 0459 03/15/15 0453  Weight: 48.2 kg (106 lb 4.2 oz) 52.6 kg (115 lb 15.4 oz) 52.6 kg (115 lb 15.4 oz)    History of present illness:  Cheyenne Boone is an 81 year old female with a known history of Hypertension ,Anxiety presented initially with dysphagia of 10 days duration She also had difficulty getting her words out and had blurring of vision of the left eye associated with drooping of left eyelid  Hospital Course:  Patient was admitted to [facility] and seen in consultation By gastroenterologist  Dr. Ricki Rodriguez he also Neurologist Dr. Mellody Drown. Dr. Katrinka Blazing felt that most likely patient had myasthenia gravis and recommended intravenous immunoglobulin and also Mestinon and prednisone. However patient developed Acute respiratory failure secondary to Aspiration pneumonia and was transferred to the CCU. She was intubated and with antibiotics and broncho-dilators she appeared to improve and was extubated on 03/13/2015. Chest x-ray showed evidence of right perihilar and basilar pneumonia Pt is on IV Vanc and Zosyn   A Dobbhoff tube is attempted but radiology but was unable to be placed. Because of coiling and Hiatal hernia  Patient subsequently decompensated from a respiratory standpoint and had to be reintubated on 03/15/2015 It was felt the patient would benefit from intravenous Mestinon for her Myasthenia and family was keen that she be transferred to a  [facility] care medical facility. I discussed her case with Dr. Kalman Shan-  Intensivist. Pt was transferred to [facility] in critical but stable condtion ,   Consultations: Dr. Katrinka Blazing- Neurologist. Dr. Donzetta Starch- GI Dr. Tim Lair; Intensivist  Discharge Exam: Filed Vitals:   03/15/15 1800  BP: 118/67  Pulse: 81  Temp: 97.1 F (36.2 C)  Resp: 16    General: Sedated-Intubated -on vent Cardiovascular: S1 S2 Respiratory: Rhonchi +bilat. Crackles rt base posteriroly  Discharge Instructions    Current Discharge Medication List    CONTINUE these medications which have NOT CHANGED   Details  alendronate (FOSAMAX) 70 MG tablet Take 70 mg by mouth once a week. Take with a full glass of water on an empty stomach.    ALPRAZolam (XANAX) 0.25 MG tablet Take 0.25 mg by mouth 2 (two) times daily as needed for anxiety or sleep.    amLODipine (NORVASC) 2.5 MG tablet Take 2.5 mg by mouth daily.    aspirin EC 81 MG tablet Take 81 mg by mouth daily.    Calcium Carbonate-Vitamin D (CALCIUM 600+D) 600-400 MG-UNIT tablet Take 1 tablet by mouth daily.    docusate sodium (COLACE) 100 MG capsule Take 100 mg by mouth daily as needed for mild constipation.    fluticasone (FLONASE) 50 MCG/ACT nasal spray Place 2 sprays into both nostrils daily. Qty: 1 g, Refills: 2    hydrochlorothiazide (MICROZIDE) 12.5 MG capsule Take 12.5 mg by mouth daily.    HYDROcodone-acetaminophen (NORCO/VICODIN) 5-325 MG per tablet Take 1-2 tablets by mouth every 6 (six) hours as needed for moderate pain. Qty: 30 tablet, Refills: 0    latanoprost (XALATAN) 0.005 % ophthalmic solution Place 1 drop into the left eye at bedtime.    polyethylene  glycol (MIRALAX / GLYCOLAX) packet Take 17 g by mouth daily as needed for mild constipation or moderate constipation. Qty: 14 each, Refills: 0    simvastatin (ZOCOR) 20 MG tablet Take 20 mg by mouth at bedtime.       No Known Allergies    The results of significant diagnostics from this hospitalization (including imaging, microbiology, ancillary and  laboratory) are listed below for reference.    Significant Diagnostic Studies: Dg Chest 1 View  03/15/2015   CLINICAL DATA:  Shortness of breath.  EXAM: CHEST 1 VIEW  COMPARISON:  03/13/2015.  FINDINGS: Mediastinum is stable. Cut off of the right mainstem bronchus is noted. This may be from mucous plugging. Associated atelectasis is present in the right lower lobe. Left lower lobe infiltrate with small left pleural effusion. No pneumothorax. Cardiomegaly. No acute bony abnormality. Surgical clips in the neck.  IMPRESSION: 1. Cut off of the right mainstem bronchus suggesting mucous plugging. Associated right lower lobe atelectasis is present. 2. Left lower lobe infiltrate with small left pleural effusion noted. 3. Stable cardiomegaly.  No pulmonary venous congestion.   Electronically Signed   By: Thomas  Register   On: 03/15/2015 07:55   Dg Chest 1 View  03/13/2015   CLINICAL DATA:  Shortness of Breath  EXAM: CHEST 1 VIEW  COMPARISON:  March 11, 2015  FINDINGS: Endotracheal tube and nasogastric tube have been removed. No apparent pneumothorax. There is persistent consolidation with volume loss in the left lower lobe. There is a small left effusion. There is mild atelectasis in the medial right base. The right lung is otherwise clear. Heart is upper normal in size with pulmonary vascularity within normal limits. There is calcification in the mitral annulus. There is extensive calcification in the aorta. No adenopathy.  IMPRESSION: No pneumothorax. Persistent left lower lobe consolidation with small left effusion. New atelectasis medial right base. No change in cardiac silhouette allowing for differences in position and technique.   Electronically Signed   By: William  Woodruff III M.D.   On: 03/13/2015 07:30   Ct Head Wo Contrast  03/09/2015   CLINICAL DATA:  81 year old female with headache, weakness, decreased p.o., possible dehydration. Initial encounter.  EXAM: CT HEAD WITHOUT CONTRAST  TECHNIQUE:  Contiguous axial images were obtained from the base of the skull through the vertex without intravenous contrast.  COMPARISON:  11/04/2014, and earlier  FINDINGS: Chronic right mastoid effusion appears stable. Mild paranasal sinus mucosal thickening and small maxillary retention cysts appear stable. Osteopenia. No acute osseous abnormality identified. No acute orbit or scalp soft tissue findings. Negative visualized noncontrast deep soft tissue spaces of the face.  Extensive Calcified atherosclerosis at the skull base. Stable cerebral volume. No ventriculomegaly. Chronic right anterior corona radiata lacunar infarct is stable. Small focus of cortical encephalomalacia in the posterior left temporal/ lateral occipital lobe appears stable (series 4, image 18). No midline shift, mass effect, or evidence of intracranial mass lesion. No acute intracranial hemorrhage identified. No cortically based acute infarct identified. No suspicious intracranial vascular hyperdensity.  IMPRESSION: Stable noncontrast CT appearance of the brain since May. No acute intracranial abnormality.   Electronically Signed   By: H  Hall M.D.   On: 03/09/2015 11:22   Ct Soft Tissue Neck W Contrast  03/09/2015   CLINICAL DATA:  Dysphagia.  EXAM: CT NECK WITH CONTRAST  TECHNIQUE: Multidetector CT imaging of the neck was performed using the standard protocol following the bolus administration of intravenous contrast.  CONTRAST:  617mmL OMNIPAQUE IOHEXOL  300 MG/ML  SOLN  COMPARISON:  None.  FINDINGS: Pharynx and larynx: No definite abnormality seen.  Salivary glands: Parotid and submandibular glands appear normal.  Thyroid: Normal.  Lymph nodes: No significant adenopathy is noted.  Vascular: Moderate calcified plaque is seen involving the proximal right internal carotid artery.  Limited intracranial: No abnormality seen.  Visualized orbits: Normal.  Mastoids and visualized paranasal sinuses: Fluid is noted in right mastoid air cells. Mucous retention  cyst seen in right maxillary sinus.  Skeleton: Multilevel degenerative disc disease is noted in the cervical spine.  Upper chest: Visualized lungs appear normal. Atherosclerosis of thoracic aorta is noted without dissection.  IMPRESSION: Moderate calcified plaque is noted in proximal right internal carotid artery; carotid ultrasound is recommended for further evaluation.  Fluid is noted in the right mastoid air cells. Clinical correlation is recommended to rule out mastoiditis.  No other significant abnormality seen in the soft tissues of the neck.   Electronically Signed   By: Lupita Raider, M.D.   On: 03/09/2015 13:19   Ct Chest W Contrast  03/09/2015   CLINICAL DATA:  Pt c/o not being able to swallow good x 1 week. Pt reports only being able to swallow small amt at a time without vomiting. Hx of CVA, no hx of CA or surg.  EXAM: CT CHEST WITH CONTRAST  TECHNIQUE: Multidetector CT imaging of the chest was performed during intravenous contrast administration.  CONTRAST:  75mL OMNIPAQUE IOHEXOL 300 MG/ML  SOLN  COMPARISON:  Chest x-ray 03/09/2015  FINDINGS: Heart: There is significant coronary artery calcification. Mitral annulus and aortic valve calcification is noted.  Vascular structures: There is dense atherosclerotic calcification of the thoracic aorta. Aorta is tortuous but not aneurysmal. No evidence for dissection.  Mediastinum/thyroid: Large hiatal hernia. The visualized portion of the thyroid gland has a normal appearance. No mediastinal, hilar, or axillary adenopathy. Contrast identified within the distal esophagus and proximal stomach.  Lungs/Airways: No pulmonary nodules, pleural effusions, or infiltrates.  Upper abdomen: Significant colonic diverticular disease. Calcifications are identified within the liver. There is focal fatty infiltration adjacent to the gallbladder fossa region liver. Small renal cysts are present. There is dense atherosclerotic calcification of the abdominal aorta.  Chest  wall/osseous structures: There wedge compression fractures of T8, T 11, and L1. No suspicious lytic or blastic lesions are identified. Scoliosis.  IMPRESSION: 1. Significant coronary artery disease, mitral annulus and aortic valve disease. 2. Tortuous aorta without aneurysm. 3. Large hiatal hernia.  No associated obstruction. 4. No significant adenopathy. 5. Significant colonic diverticulosis. 6. Focal fatty infiltration of the liver. 7. Small renal cysts. 8. Hepatic granulomata. 9. Thoracic and lumbar wedge compression fractures associated with scoliosis.   Electronically Signed   By: Norva Pavlov M.D.   On: 03/09/2015 13:19   Mr Brain Wo Contrast  03/10/2015   CLINICAL DATA:  81 year old female with left eye blurred vision and left eye drooping. Generalized weakness and difficulty swallowing for 2 days. Decreased p.o. intake. Altered mental status. Initial encounter.  EXAM: MRI HEAD WITHOUT CONTRAST  TECHNIQUE: Multiplanar, multiecho pulse sequences of the brain and surrounding structures were obtained without intravenous contrast.  COMPARISON:  Neck CT and head CT 03/09/2015.  Brain MRI 12/18/2013.  FINDINGS: Cerebral volume is stable since 2015. Major intracranial vascular flow voids are stable. No restricted diffusion or evidence of acute infarction.  Chronic lacunar type infarcts in both cerebellar hemispheres, an the right corona radiata. Stable mild to moderate for age T2 heterogeneity in  the deep gray matter nuclei and cerebral white matter elsewhere. Probable normal sulcus anatomic variation in the right parietal lobe again noted (series 10, image 20) or less likely small arachnoid cyst.  No midline shift, mass effect, evidence of mass lesion, ventriculomegaly, or acute intracranial hemorrhage. Cervicomedullary junction and pituitary are within normal limits. Stable right mastoid effusion. Stable paranasal sinuses since 2015. Interval right globe postoperative changes. Other orbit and scalp soft  tissues are stable. Normal bone marrow signal.  There is chronic cervical spine degeneration, with at least mild spinal stenosis at C3-C4 and C4-C5. This does not appear significantly progressed since 2015.  IMPRESSION: 1. No acute intracranial abnormality. Chronic small vessel ischemic disease is stable since 2015. 2. Chronic cervical spine degeneration with C3-C4 and C4-C5 spinal stenosis which does not appear significantly progressed since 2015.   Electronically Signed   By: Odessa Fleming M.D.   On: 03/10/2015 09:57   Dg Esophagus  03/09/2015   CLINICAL DATA:  The patient reports being unable swallow for the past week with sensation of something being stuck in the throat. The patient is quite weak and unable to stand; there is a history of previous CVA.  EXAM: ESOPHOGRAM/BARIUM SWALLOW  TECHNIQUE: Single contrast examination was performed using  thin barium.  FLUOROSCOPY TIME:  Fluoroscopy Time:  2 minutes, 46 seconds  Number of Acquired Images:  3  COMPARISON:  Chest x-ray of Oct 31, 2014  FINDINGS: The patient was un willing or unable to swallowed the barium preparation in a meaningful fashion. There was no laryngeal penetration of the barium. Small amounts did reach the mid and distal esophagus but 1 cannot assess the status of the esophagus on this study. There is abnormal increased density in the AP window and left infrahilar regions that appears new but is not well evaluated on today's fluoroscopic examination.  IMPRESSION: 1. Nondiagnostic barium swallow examination due to the patient's inability to more than tiny amounts of the barium. CT scanning of the neck and chest would be a useful next imaging step. 2. Possible mass in the AP window. Possible infrahilar pneumonia on the left. 3. The extremely limited nature of the study was discussed by me by telephone with Dr. Daryel November in the [facility] emergency department.   Electronically Signed   By: David  [place] M.D.   On: 03/09/2015 11:48   Dg Fluoro Rm  1-60 Min  03/15/2015   CLINICAL DATA:  Feeding tube placement.  EXAM: FLOURO RM 1-60 MIN  CONTRAST:  None.  FLUOROSCOPY TIME:  Entrance dose 125.5 mGy  Scratched  COMPARISON:  03/15/2015, 03/11/2015 scratched  FINDINGS: Multiple attempts were made to pass a Dobhoff tube below the left hemidiaphragm. Prominent hiatal hernia is present. The Dobhoff tube could not be passed below the left hemi diaphragm after innumerable attempts. Tube was pulled and exam terminated. Patient left in stable condition.  IMPRESSION: Inability to place double tube tip below left hemidiaphragm due to large hiatal hernia.   Electronically Signed   By: Maisie Fus  Register   On: 03/15/2015 10:37   Dg Fluoro Rm 1-60 Min  03/11/2015   CLINICAL DATA:  Orogastric tube placement. Unsuccessful attempt dad orogastric tube placement at bedside.  EXAM: FLOURO RM 1-60 MIN  FLUOROSCOPY TIME:  Radiation Exposure Index (as provided by the fluoroscopic device): 3.5 mGy  COMPARISON:  None.  FINDINGS: A Dobbhoff tube was inserted through the right nare into the esophagus with the metallic tip projecting over the fundus of the stomach.  The tube was secured to the nose.  IMPRESSION: Dobbhoff tube with the tip projecting over the fundus of the stomach.   Electronically Signed   By: Elige Ko   On: 03/11/2015 17:05   US Carotid Bilateral  03/10/2015   CLINICAL DATA:  Dysphagia. Remote stroke. Syncope, visual disturbance, hyperlipidemia. Remote endarterectomy 15 years ago, laterality not specified.  EXAM: BILATERAL CAROTID DUPLEX ULTRASOUND  TECHNIQUE: Wallace Cullens scale imaging, color Doppler and duplex ultrasound was performed of bilateral carotid and vertebral arteries in the neck.  COMPARISON:  CT 03/09/2015, ultrasound 12/18/2013  REVIEW OF SYSTEMS: Quantification of carotid stenosis is based on velocity parameters that correlate the residual internal carotid diameter with NASCET-based stenosis levels, using the diameter of the distal internal carotid lumen as  the denominator for stenosis measurement.  The following velocity measurements were obtained:  PEAK SYSTOLIC/END DIASTOLIC  RIGHT  ICA:                     149/11cm/sec  CCA:                     84/12cm/sec  SYSTOLIC ICA/CCA RATIO:  1.8  DIASTOLIC ICA/CCA RATIO: 0.9  ECA:                     84cm/sec  LEFT  ICA:                     82/21cm/sec  CCA:                     121/13cm/sec  SYSTOLIC ICA/CCA RATIO:  0.7  DIASTOLIC ICA/CCA RATIO: 1.6  ECA:                     80cm/sec  FINDINGS: RIGHT CAROTID ARTERY: Eccentric partially calcified plaque effaces the bulb and extends into the proximal ICA resulting in at least mild stenosis. There mildly elevated peak systolic velocities at the level of plaque in the proximal ICA. Elsewhere normal waveforms and color Doppler signal.  RIGHT VERTEBRAL ARTERY:  Normal flow direction and waveform.  LEFT CAROTID ARTERY: Mild intimal thickening. No focal plaque or stenosis. Normal waveforms and color Doppler signal.  LEFT VERTEBRAL ARTERY: Normal flow direction and waveform.  IMPRESSION: 1. Right carotid bifurcation and proximal ICA plaque, resulting in less than 50% diameter stenosis. The exam does not exclude plaque ulceration or embolization. Continued surveillance recommended. 2. No significant left carotid plaque or stenosis.   Electronically Signed   By: Corlis Leak M.D.   On: 03/10/2015 13:36   Dg Chest Port 1 View  03/15/2015   CLINICAL DATA:  Status post intubation  EXAM: PORTABLE CHEST - 1 VIEW  COMPARISON:  03/15/2015  FINDINGS: Endotracheal tube is now seen approximately 2.1 cm above the carina. A nasogastric catheter is noted in the midesophagus. This could be advanced several cm into the stomach. Patient is rotated to the right accentuating the mediastinal markings in tortuosity of the thoracic aorta. Cardiac shadow is stable. Stable right perihilar and right basilar consolidation.  IMPRESSION: Endotracheal tube 2.1 cm above the carina.  The remainder the exam is  stable. Nasogastric catheter is noted in the midesophagus.   Electronically Signed   By: Alcide Clever M.D.   On: 03/15/2015 14:13   Portable Chest Xray  03/11/2015   CLINICAL DATA:  Respiratory failure.  Intubation  EXAM: PORTABLE CHEST 1 VIEW  COMPARISON:  03/09/2015  FINDINGS: Endotracheal tube enters the right main bronchus. Recommend withdrawal 3 of 4 cm.  The right lung is hyperinflated. There is collapse in the left lower lobe.  NG tube coiled in the hiatal hernia above the diaphragm.  Negative for heart failure.  IMPRESSION: Endotracheal tube right main bronchus, recommend withdrawal of 3-4 cm.  Collapse of left lower lobe  NG tube coiled in the hiatal hernia  These results were called by telephone at the time of interpretation on 03/11/2015 at 12:46 pm to [facility], RN , who verbally acknowledged these results.   Electronically Signed   By: Marlan Palau M.D.   On: 03/11/2015 12:47    Microbiology: Recent Results (from the past 240 hour(s))  Culture, blood (routine x 2)     Status: None (Preliminary result)   Collection Time: 03/11/15  1:23 PM  Result Value Ref Range Status   Specimen Description BLOOD RIGHT ARM  Final   Special Requests BOTTLES DRAWN AEROBIC AND ANAEROBIC 2CC  Final   Culture NO GROWTH 4 DAYS  Final   Report Status PENDING  Incomplete  Culture, blood (routine x 2)     Status: None (Preliminary result)   Collection Time: 03/11/15  1:23 PM  Result Value Ref Range Status   Specimen Description BLOOD LEFT ASSIST CONTROL  Final   Special Requests   Final    BOTTLES DRAWN AEROBIC AND ANAEROBIC 4CC ANAERO 7CC AERO   Culture NO GROWTH 4 DAYS  Final   Report Status PENDING  Incomplete  MRSA PCR Screening     Status: None   Collection Time: 03/11/15  2:00 PM  Result Value Ref Range Status   MRSA by PCR  NEGATIVE Final    RESULTS INVALID. NOTIFIED JAMIE SMITH GREGORY AT 1730 FOR RECOLLECT MSS  Culture, bal-quantitative     Status: None   Collection Time: 03/11/15  6:45 PM   Result Value Ref Range Status   Specimen Description BRONCHIAL ALVEOLAR LAVAGE  Final   Special Requests Normal  Final   Gram Stain   Final    FEW WBC SEEN MODERATE GRAM POSITIVE COCCI FEW GRAM NEGATIVE RODS GOOD SPECIMEN - 80-90% WBCS    Culture LIGHT GROWTH HAEMOPHILUS PARAHAEMOLYTICUS  Final   Report Status 03/15/2015 FINAL  Final     Labs: Basic Metabolic Panel:  Recent Labs Lab 03/10/15 0611 03/11/15 1615 03/13/15 0422 03/14/15 0127 03/15/15 0611 03/15/15 1437  NA 142 135 142  --  141 135  K 4.0 3.1* 3.2*  --  2.9* 3.3*  CL 108 105 109  --  107 102  CO2 21* 17* 26  --  25 25  GLUCOSE 74 228* 133*  --  135* 107*  BUN 17 19 21*  --  13 15  CREATININE 0.79 1.00 1.11* 0.99 0.90 0.94  CALCIUM 9.2 8.1* 9.0  --  9.1 8.9  MG  --   --  1.7  --   --  1.8  PHOS  --   --  2.2*  --   --  2.4*   Liver Function Tests:  Recent Labs Lab 03/11/15 1615  AST 25  ALT 12*  ALKPHOS 94  BILITOT 1.6*  PROT 6.0*  ALBUMIN 3.5   No results for input(s): LIPASE, AMYLASE in the last 168 hours. No results for input(s): AMMONIA in the last 168 hours. CBC:  Recent Labs Lab 03/09/15 1035 03/10/15 0611 03/11/15 1615 03/13/15 0422 03/15/15 0611  WBC 5.5 6.1 21.2* 11.9* 9.6  NEUTROABS  --   --  19.2* 10.9*  --   HGB 14.7 14.2 12.8 12.2 13.7  HCT 43.8 43.1 39.4 36.2 40.2  MCV 91.9 92.6 93.6 92.0 90.7  PLT 248 257 285 190 255   Cardiac Enzymes:  Recent Labs Lab 03/09/15 1035 03/11/15 1615 03/11/15 2302 03/12/15 0520  CKMB  --  4.7 4.5 6.7*  TROPONINI <0.03 <0.03 <0.03 <0.03   BNP: BNP (last 3 results) No results for input(s): BNP in the last 8760 hours.  ProBNP (last 3 results) No results for input(s): PROBNP in the last 8760 hours.  CBG:  Recent Labs Lab 03/14/15 2348 03/15/15 0335 03/15/15 0714 03/15/15 1118 03/15/15 1757  GLUCAP 134* 120* 149* 128* 105*       Signed:  Fynley Chrystal   03/15/2015, 6:12 PM

## 2015-03-15 NOTE — Progress Notes (Addendum)
MEDICATION RELATED CONSULT NOTE - INITIAL   Pharmacy Consult for Constipation Prevention and Electrolyte management  Indication: Constipation prevention and electrolyte management   No Known Allergies  Patient Measurements: Height:  (149.9 cm) Weight: 115 lb 15.4 oz (52.6 kg) IBW/kg (Calculated) : 43.2 Adjusted Body Weight:   Vital Signs: Temp: 96.1 F (35.6 C) (10/03 1400) Temp Source: Axillary (10/03 1100) BP: 128/88 mmHg (10/03 1400) Pulse Rate: 83 (10/03 1400) Intake/Output from previous day: 10/02 0701 - 10/03 0700 In: 1510 [I.V.:1160; IV Piggyback:350] Out: 1610 [Urine:1610] Intake/Output from this shift: Total I/O In: 200 [IV Piggyback:200] Out: -   Labs:  Recent Labs  03/13/15 0422 03/14/15 0127 03/15/15 0611  WBC 11.9*  --  9.6  HGB 12.2  --  13.7  HCT 36.2  --  40.2  PLT 190  --  255  CREATININE 1.11* 0.99 0.90  MG 1.7  --   --   PHOS 2.2*  --   --    Estimated Creatinine Clearance: 32.1 mL/min (by C-G formula based on Cr of 0.9).   Microbiology: Recent Results (from the past 720 hour(s))  Culture, blood (routine x 2)     Status: None (Preliminary result)   Collection Time: 03/11/15  1:23 PM  Result Value Ref Range Status   Specimen Description BLOOD RIGHT ARM  Final   Special Requests BOTTLES DRAWN AEROBIC AND ANAEROBIC 2CC  Final   Culture NO GROWTH 4 DAYS  Final   Report Status PENDING  Incomplete  Culture, blood (routine x 2)     Status: None (Preliminary result)   Collection Time: 03/11/15  1:23 PM  Result Value Ref Range Status   Specimen Description BLOOD LEFT ASSIST CONTROL  Final   Special Requests   Final    BOTTLES DRAWN AEROBIC AND ANAEROBIC 4CC ANAERO 7CC AERO   Culture NO GROWTH 4 DAYS  Final   Report Status PENDING  Incomplete  MRSA PCR Screening     Status: None   Collection Time: 03/11/15  2:00 PM  Result Value Ref Range Status   MRSA by PCR  NEGATIVE Final    RESULTS INVALID. NOTIFIED JAMIE SMITH GREGORY AT 1730 FOR  RECOLLECT MSS  Culture, bal-quantitative     Status: None   Collection Time: 03/11/15  6:45 PM  Result Value Ref Range Status   Specimen Description BRONCHIAL ALVEOLAR LAVAGE  Final   Special Requests Normal  Final   Gram Stain   Final    FEW WBC SEEN MODERATE GRAM POSITIVE COCCI FEW GRAM NEGATIVE RODS GOOD SPECIMEN - 80-90% WBCS    Culture LIGHT GROWTH HAEMOPHILUS PARAHAEMOLYTICUS  Final   Report Status 03/15/2015 FINAL  Final    Medical History: Past Medical History  Diagnosis Date  . Hypertension   . Hypercholesteremia   . Stroke     Medications:  Scheduled:  . antiseptic oral rinse  7 mL Mouth Rinse QID  . aspirin  300 mg Rectal Daily  . chlorhexidine gluconate  15 mL Mouth Rinse BID  . enoxaparin (LOVENOX) injection  30 mg Subcutaneous Q24H  . famotidine  20 mg Per Tube QHS  . fentaNYL      . fluticasone  2 spray Each Nare Daily  . free water  200 mL Per Tube BID  . haloperidol lactate  2 mg Intravenous QHS,MR X 1  . Immune Globulin 10%  400 mg/kg Intravenous Q24 Hr x 5  . insulin aspart  0-9 Units Subcutaneous 6 times per  day  . ipratropium-albuterol  3 mL Nebulization Q6H  . midazolam      . piperacillin-tazobactam (ZOSYN)  IV  3.375 g Intravenous 3 times per day  . pravastatin  10 mg Oral Daily  . predniSONE  20 mg Oral Q breakfast  . pyridostigmine  2 mg Intramuscular 4 times per day  . senna-docusate  1 tablet Oral BID  . vancomycin  750 mg Intravenous Q18H  . vecuronium  10 mg Intravenous Once    Assessment: Patient being treated for myasthenia gravis and currently intubated.   All labs within normal limits except K= 3.3; Phos: 2.4   Goal of Therapy:  Prevention of constipation and normalization of electrolyte  Plan:  1) Constipation prevention: Will start docusate/senna 1 tablet PO BID 2) Electrolytes: Will give KPhos 30 mmol IV x 1. Will recheck electrolytes with am labs.   Cheyenne Boone 03/15/2015,2:42 PM

## 2015-03-15 NOTE — Consult Note (Signed)
After further assessment, patient with increased WOB using accessory  Muscles to breathe, will plan for intubation.

## 2015-03-15 NOTE — Plan of Care (Signed)
Problem: Phase I Progression Outcomes Goal: Pain controlled with appropriate interventions Outcome: Progressing Fentanyl gtt Goal: OOB as tolerated unless otherwise ordered Outcome: Not Progressing BR Goal: Initial discharge plan identified Outcome: Progressing Transfer to [facility] before rehab Goal: Voiding-avoid urinary catheter unless indicated Outcome: Progressing Critically ill with foley cath Goal: Hemodynamically stable Outcome: Progressing No need for pressors.  NSR-ST with occ PVC's.  K+ replacement given

## 2015-03-15 NOTE — Progress Notes (Deleted)
Pharmacy Consult for Vancomycin/Zosyn Indication: Sepsis/possible aspiration PNA  No Known Allergies  Patient Measurements: Height:  (149.9 cm) Weight: 115 lb 15.4 oz (52.6 kg) IBW/kg (Calculated) : 43.2   Vital Signs: Temp: 96.3 F (35.7 C) (10/03 1300) Temp Source: Axillary (10/03 1100) BP: 165/94 mmHg (10/03 1300) Pulse Rate: 103 (10/03 1300) Intake/Output from previous day: 10/02 0701 - 10/03 0700 In: 1510 [I.V.:1160; IV Piggyback:350] Out: 1610 [Urine:1610] Intake/Output from this shift: Total I/O In: 200 [IV Piggyback:200] Out: -   Labs:  Recent Labs  03/13/15 0422 03/14/15 0127 03/15/15 0611  WBC 11.9*  --  9.6  HGB 12.2  --  13.7  PLT 190  --  255  CREATININE 1.11* 0.99 0.90   Estimated Creatinine Clearance: 32.1 mL/min (by C-G formula based on Cr of 0.9).  Recent Labs  03/14/15 0127  VANCOTROUGH 13     Microbiology: Recent Results (from the past 720 hour(s))  Culture, blood (routine x 2)     Status: None (Preliminary result)   Collection Time: 03/11/15  1:23 PM  Result Value Ref Range Status   Specimen Description BLOOD RIGHT ARM  Final   Special Requests BOTTLES DRAWN AEROBIC AND ANAEROBIC 2CC  Final   Culture NO GROWTH 4 DAYS  Final   Report Status PENDING  Incomplete  Culture, blood (routine x 2)     Status: None (Preliminary result)   Collection Time: 03/11/15  1:23 PM  Result Value Ref Range Status   Specimen Description BLOOD LEFT ASSIST CONTROL  Final   Special Requests   Final    BOTTLES DRAWN AEROBIC AND ANAEROBIC 4CC ANAERO 7CC AERO   Culture NO GROWTH 4 DAYS  Final   Report Status PENDING  Incomplete  MRSA PCR Screening     Status: None   Collection Time: 03/11/15  2:00 PM  Result Value Ref Range Status   MRSA by PCR  NEGATIVE Final    RESULTS INVALID. NOTIFIED JAMIE SMITH GREGORY AT 1730 FOR RECOLLECT MSS  Culture, bal-quantitative     Status: None   Collection Time: 03/11/15  6:45 PM  Result Value Ref Range Status   Specimen Description BRONCHIAL ALVEOLAR LAVAGE  Final   Special Requests Normal  Final   Gram Stain   Final    FEW WBC SEEN MODERATE GRAM POSITIVE COCCI FEW GRAM NEGATIVE RODS GOOD SPECIMEN - 80-90% WBCS    Culture LIGHT GROWTH HAEMOPHILUS PARAHAEMOLYTICUS  Final   Report Status 03/15/2015 FINAL  Final    Medical History: Past Medical History  Diagnosis Date  . Hypertension   . Hypercholesteremia   . Stroke     Medications:  Scheduled:  . antiseptic oral rinse  7 mL Mouth Rinse QID  . aspirin  300 mg Rectal Daily  . chlorhexidine gluconate  15 mL Mouth Rinse BID  . enoxaparin (LOVENOX) injection  30 mg Subcutaneous Q24H  . famotidine  20 mg Per Tube QHS  . fentaNYL      . fluticasone  2 spray Each Nare Daily  . free water  200 mL Per Tube BID  . haloperidol lactate  2 mg Intravenous QHS,MR X 1  . Immune Globulin 10%  400 mg/kg Intravenous Q24 Hr x 5  . insulin aspart  0-9 Units Subcutaneous 6 times per day  . ipratropium-albuterol  3 mL Nebulization Q6H  . midazolam      . piperacillin-tazobactam (ZOSYN)  IV  3.375 g Intravenous 3 times per day  . pravastatin  10 mg Oral Daily  . predniSONE  20 mg Oral Q breakfast  . pyridostigmine  60 mg Oral 3 times per day  . sterile water (preservative free)      . vancomycin  750 mg Intravenous Q18H  . vecuronium      . vecuronium  10 mg Intravenous Once   Infusions:  . sodium chloride 40 mL/hr at 03/14/15 1350  . feeding supplement (JEVITY 1.5 CAL/FIBER)    . fentaNYL infusion INTRAVENOUS     PRN: ALPRAZolam, haloperidol lactate, hydrALAZINE, ondansetron **OR** ondansetron (ZOFRAN) IV, senna-docusate  Assessment: 81 y/o F admitted with dysphagia developing acute respiratory failure ordered empiric abx for sepsis and possible aspiration PNA.   Vancomycin trough level resulted @ 15 mcg/ml.   Goal of Therapy:  Vancomycin trough level 15-20 mcg/ml  Plan:  Will continue current therapy. Trough scheduled with the 5th dose  of vancomycin. Will f/u renal function and culture results.     Chidiebere Wynn D, Pharm.D. Clinical Pharmacist 03/15/2015,1:29 PM

## 2015-03-15 NOTE — Progress Notes (Signed)
Dr Katrinka Blazing called regarding inability to insert DHT and no route for mestinon.  Pt states it is getting hard to breathe in. Dr Katrinka Blazing states she needs to be transferred to another facility for IV mestinon.  Dr Marcello Fennel notified of need for transfer and is coming to speak with family.  Daughter, Steward Drone, present prefers [facility] or [place]. Pt maintains sats on 30% venti.  Dr Belia Heman suggests BIPAP.  Respiratiory therapy aware.

## 2015-03-15 NOTE — Progress Notes (Signed)
MEDICATION RELATED CONSULT NOTE - INITIAL   Pharmacy Consult for electrolytes Indication: hypokalemia  No Known Allergies  Patient Measurements: Height:  (149.9 cm) Weight: 115 lb 15.4 oz (52.6 kg) IBW/kg (Calculated) : 43.2 Adjusted Body Weight: 52.6 kg  Vital Signs: Temp: 97 F (36.1 C) (10/03 0600) BP: 133/73 mmHg (10/03 0600) Pulse Rate: 88 (10/03 0600) Intake/Output from previous day: 10/02 0701 - 10/03 0700 In: 1510 [I.V.:1160; IV Piggyback:350] Out: 1610 [Urine:1610] Intake/Output from this shift:    Labs:  Recent Labs  03/13/15 0422 03/14/15 0127 03/15/15 0611  WBC 11.9*  --  9.6  HGB 12.2  --  13.7  HCT 36.2  --  40.2  PLT 190  --  255  CREATININE 1.11* 0.99 0.90  MG 1.7  --   --   PHOS 2.2*  --   --    Estimated Creatinine Clearance: 32.1 mL/min (by C-G formula based on Cr of 0.9).   Microbiology: Recent Results (from the past 720 hour(s))  Culture, blood (routine x 2)     Status: None (Preliminary result)   Collection Time: 03/11/15  1:23 PM  Result Value Ref Range Status   Specimen Description BLOOD RIGHT ARM  Final   Special Requests BOTTLES DRAWN AEROBIC AND ANAEROBIC 2CC  Final   Culture NO GROWTH 3 DAYS  Final   Report Status PENDING  Incomplete  Culture, blood (routine x 2)     Status: None (Preliminary result)   Collection Time: 03/11/15  1:23 PM  Result Value Ref Range Status   Specimen Description BLOOD LEFT ASSIST CONTROL  Final   Special Requests   Final    BOTTLES DRAWN AEROBIC AND ANAEROBIC 4CC ANAERO 7CC AERO   Culture NO GROWTH 3 DAYS  Final   Report Status PENDING  Incomplete  MRSA PCR Screening     Status: None   Collection Time: 03/11/15  2:00 PM  Result Value Ref Range Status   MRSA by PCR  NEGATIVE Final    RESULTS INVALID. NOTIFIED JAMIE SMITH GREGORY AT 1730 FOR RECOLLECT MSS  Culture, bal-quantitative     Status: None (Preliminary result)   Collection Time: 03/11/15  6:45 PM  Result Value Ref Range Status   Specimen Description BRONCHIAL ALVEOLAR LAVAGE  Final   Special Requests Normal  Final   Gram Stain   Final    FEW WBC SEEN MODERATE GRAM POSITIVE COCCI FEW GRAM NEGATIVE RODS GOOD SPECIMEN - 80-90% WBCS    Culture HOLDING FOR POSSIBLE PATHOGEN  Final   Report Status PENDING  Incomplete    Medical History: Past Medical History  Diagnosis Date  . Hypertension   . Hypercholesteremia   . Stroke     Medications:  Infusions:  . sodium chloride 40 mL/hr at 03/14/15 1350  . feeding supplement (JEVITY 1.5 CAL/FIBER)      Assessment: 89 yof with dehydration/malnutrition, K 2.9 this AM. Electrolyte protocol entered this AM.   Goal of Therapy:  K 3.5 to 5, Mg 1.8 to 2.4, Phos 3.5 to 5  Plan:  Potassium 2.9 this morning, ordered 10 mEq IV x 4 due to decreased creatinine clearance and will follow up with BMP/Magnesium/Phosphorus this afternoon.  Carola Frost, Pharm.D. Clinical Pharmacist 03/15/2015,7:03 AM

## 2015-03-15 NOTE — Progress Notes (Signed)
Dr Belia Heman and Dr Katrinka Blazing here.  Decided to intubate pt. Pt and daughter aware and want this done

## 2015-03-15 NOTE — Progress Notes (Signed)
[facility] Critical Care Medicine Progess Note    ASSESSMENT/PLAN   81 year old female with dysphagia, dehydration, malnutrition, transferred to the ICU for acute respiratory distress, concern for possible aspiration. Concern for possible myasthenia gravis, currently on Mestinon. Patient now extubated courses but, intermittent  delirium.  PULMONARY -extubated on 35% venti mask A: Acute hypoxic respiratory failure Status post Ventilator-dependent; the patient was extubated -Suspect that she has some degree of atelectasis postextubation, in addition to mucosal secretions which were noted on previous bronchoscopy. -Copious mucosal secretions, status post bronchoscopy. Bronchial cultures are currently pending.  -Patient is currently on vancomycin and Zosyn, started September 29 --Empiric antibiotics -Await bronchoscopy culture results, we'll wean antibiotics as tolerated.  -Repeat chest x-ray as needed -check NIF  CARDIOVASCULAR CVL - R Fem-removed  RENAL A:  Dehydration, doing better - cont with IVF - cont with electrolyte monitoring.   GASTROINTESTINAL A:  Dysphagia-dubhoff placement unsucessful P:  - GI following, plans for possible upper endoscopy  - PPI, will change to H2 blocker.  HEMATOLOGIC Leukocytosis. -follow CBC -Continue enoxaparin for DVT prophylaxis.  INFECTIOUS A:  Sepsis-Aspiration Pneumonia P:  -Continue vancomycin and Zosyn. Await bronchoscopy cultures.  Lines: R Fem CVL 9/29, patient pulled this out on 03/13/2015.  Cultures BCx2 9/29  Antibiotics Abx: Vanc/Zosyn, start date 9/29  ENDOCRINE - ICU hypo/hyperglycemia protocol  NEUROLOGIC -extubated,  Possible myasthenia gravis. Neurology following. Currently on pyridostigmine 60 mg every 8 hours. Agitation and delirium, possibly secondary to sundowning and exacerbated by steroids. P:  The patient is currently sedated after receiving a small dose of Haldol for  treatment of  delirium. -We'll start the patient on IV Haldol 2 mg IV daily at bedtime, may repeat 1. -Intolerant of Precedex due to bradycardia, even at very low doses.  Social  - Patient lives next door, to an individual who is her caretaker. -Patient has 2 daughters who are the health care power of attorney, currently she is a full code  Recommend Palliative care consult, recommend DNR status ---------------------------------------   ----------------------------------------   Name: Cheyenne Boone MRN: [id number] DOB: 07-06-1923    ADMISSION DATE:  03/09/2015    CHIEF COMPLAINT:  Follow Dyspnea, resp failure    SUBJECTIVE:  intermittantly lethargic, on fio2 35% Complains of minimal SOB  Review of Systems:  ROS limited due to delerium   VITAL SIGNS: Temp:  [97 F (36.1 C)-98.8 F (37.1 C)] 97.5 F (36.4 C) (10/03 1100) Pulse Rate:  [40-120] 94 (10/03 1100) Resp:  [17-35] 30 (10/03 1100) BP: (110-180)/(66-142) 164/91 mmHg (10/03 1100) SpO2:  [90 %-100 %] 100 % (10/03 1100) FiO2 (%):  [30 %] 30 % (10/03 1100) Weight:  [115 lb 15.4 oz (52.6 kg)] 115 lb 15.4 oz (52.6 kg) (10/03 0453) HEMODYNAMICS:   VENTILATOR SETTINGS: Vent Mode:  [-]  FiO2 (%):  [30 %] 30 % INTAKE / OUTPUT:  Intake/Output Summary (Last 24 hours) at 03/15/15 1125 Last data filed at 03/15/15 1046  Gross per 24 hour  Intake   1500 ml  Output    985 ml  Net    515 ml    PHYSICAL EXAMINATION: Physical Examination:   VS: BP 164/91 mmHg  Pulse 94  Temp(Src) 97.5 F (36.4 C) (Oral)  Resp 30  Ht  (1.499 m)  Wt 115 lb 15.4 oz (52.6 kg)  BMI 23.41 kg/m2  SpO2 100%  General Appearance: No distress  Neuro:without focal findings, mental status normal. HEENT: PERRLA, EOM intact. Pulmonary: normal breath sounds  CardiovascularNormal S1,S2.  No m/r/g.   Abdomen: Benign, Soft, non-tender. Renal:  No costovertebral tenderness  GU:  Not performed at this time. Endocrine: No evident  thyromegaly. Skin:   warm, no rashes, no ecchymosis  Extremities: normal, no cyanosis, clubbing.   LABS:   LABORATORY PANEL:   CBC  Recent Labs Lab 03/15/15 0611  WBC 9.6  HGB 13.7  HCT 40.2  PLT 255    Chemistries   Recent Labs Lab 03/11/15 1615 03/13/15 0422  03/15/15 0611  NA 135 142  --  141  K 3.1* 3.2*  --  2.9*  CL 105 109  --  107  CO2 17* 26  --  25  GLUCOSE 228* 133*  --  135*  BUN 19 21*  --  13  CREATININE 1.00 1.11*  < > 0.90  CALCIUM 8.1* 9.0  --  9.1  MG  --  1.7  --   --   PHOS  --  2.2*  --   --   AST 25  --   --   --   ALT 12*  --   --   --   ALKPHOS 94  --   --   --   BILITOT 1.6*  --   --   --   < > = values in this interval not displayed.   Recent Labs Lab 03/14/15 1717 03/14/15 1944 03/14/15 2348 03/15/15 0335 03/15/15 0714 03/15/15 1118  GLUCAP 122* 114* 134* 120* 149* 128*    Recent Labs Lab 03/12/15 1600 03/13/15 0350 03/14/15 0920  PHART 7.32* 7.43 7.46*  PCO2ART 42 35 37  PO2ART 107 171* 50*    Recent Labs Lab 03/11/15 1615  AST 25  ALT 12*  ALKPHOS 94  BILITOT 1.6*  ALBUMIN 3.5    Cardiac Enzymes  Recent Labs Lab 03/12/15 0520  TROPONINI <0.03    RADIOLOGY:  Dg Chest 1 View  03/15/2015   CLINICAL DATA:  Shortness of breath.  EXAM: CHEST 1 VIEW  COMPARISON:  03/13/2015.  FINDINGS: Mediastinum is stable. Cut off of the right mainstem bronchus is noted. This may be from mucous plugging. Associated atelectasis is present in the right lower lobe. Left lower lobe infiltrate with small left pleural effusion. No pneumothorax. Cardiomegaly. No acute bony abnormality. Surgical clips in the neck.  IMPRESSION: 1. Cut off of the right mainstem bronchus suggesting mucous plugging. Associated right lower lobe atelectasis is present. 2. Left lower lobe infiltrate with small left pleural effusion noted. 3. Stable cardiomegaly.  No pulmonary venous congestion.   Electronically Signed   By: Maisie Fus  Register   On:  03/15/2015 07:55   Dg Fluoro Rm 1-60 Min  03/15/2015   CLINICAL DATA:  Feeding tube placement.  EXAM: FLOURO RM 1-60 MIN  CONTRAST:  None.  FLUOROSCOPY TIME:  Entrance dose 125.5 mGy  Scratched  COMPARISON:  03/15/2015, 03/11/2015 scratched  FINDINGS: Multiple attempts were made to pass a Dobhoff tube below the left hemidiaphragm. Prominent hiatal hernia is present. The Dobhoff tube could not be passed below the left hemi diaphragm after innumerable attempts. Tube was pulled and exam terminated. Patient left in stable condition.  IMPRESSION: Inability to place double tube tip below left hemidiaphragm due to large hiatal hernia.   Electronically Signed   By: Maisie Fus  Register   On: 03/15/2015 10:37       I have personally obtained a history, examined the patient, evaluated Pertinent laboratory and RadioGraphic/imaging results, and  formulated the  assessment and plan   The Patient requires high complexity decision making for assessment and support, frequent evaluation and titration of therapies.  Patient/Family are satisfied with Plan of action and management. All questions answered  Lucie Leather, M.D.  [facility]  Medical Director [facility] Medical Director [facility] Cardio-Pulmonary Department

## 2015-03-15 NOTE — Progress Notes (Signed)
Patient ID: Cheyenne Boone, female   DOB: [birth year], 81 y.o.   MRN: [id number] Patient ID: Cheyenne Boone, female   DOB: November 26, 1923, 81 y.o.   MRN: [id number] Subjective: 81 y/o f with dysphagia - possible Myasthenia developed  Acute Respiratory failure secondary to pneumonia-Patient  Has been having episodes of agitation, Still weak Objective: Vital signs in last 24 hours: Temp:  [97 F (36.1 C)-98.8 F (37.1 C)] 97 F (36.1 C) (10/03 0600) Pulse Rate:  [40-120] 100 (10/03 0700) Resp:  [17-35] 29 (10/03 0700) BP: (110-180)/(64-142) 153/83 mmHg (10/03 0700) SpO2:  [90 %-100 %] 98 % (10/03 0700) FiO2 (%):  [30 %] 30 % (10/03 0121) Weight:  [52.6 kg (115 lb 15.4 oz)] 52.6 kg (115 lb 15.4 oz) (10/03 0453) Weight change: 0 kg (0 lb)    Intake/Output from previous day: 10/02 0701 - 10/03 0700 In: 1510 [I.V.:1160; IV Piggyback:350] Out: 1610 [Urine:1610] Intake/Output this shift:    GENERAL:Awake-On supplemental O2  EYES: No pallor, no icterus. HEENT:  Endotracheal tube in place. Head atraumatic, normocephalic.  NECK: supple, no jugular venous distention. No thyroid enlargement, no tenderness.  LUNGS: clear to auscultation. No wheezing, rhonchi or crepitation. Good air entry bilaterally. CARDIOVASCULAR: regular rate and rhythm, S1, S2 normal. No tachycardia.  ABDOMEN: Soft, non-tender, normal bowel sounds, no organomegaly or mass.  EXTREMITIES: No pedal edema, cyanosis, or clubbing.  NEUROLOGIC: Lethargic  Lab Results:  Recent Labs  03/13/15 0422 03/15/15 0611  WBC 11.9* 9.6  HGB 12.2 13.7  HCT 36.2 40.2  PLT 190 255   BMET  Recent Labs  03/13/15 0422 03/14/15 0127 03/15/15 0611  NA 142  --  141  K 3.2*  --  2.9*  CL 109  --  107  CO2 26  --  25  GLUCOSE 133*  --  135*  BUN 21*  --  13  CREATININE 1.11* 0.99 0.90  CALCIUM 9.0  --  9.1    Studies/Results: Dg Chest 1 View  03/15/2015   CLINICAL DATA:  Shortness of breath.  EXAM: CHEST 1 VIEW   COMPARISON:  03/13/2015.  FINDINGS: Mediastinum is stable. Cut off of the right mainstem bronchus is noted. This may be from mucous plugging. Associated atelectasis is present in the right lower lobe. Left lower lobe infiltrate with small left pleural effusion. No pneumothorax. Cardiomegaly. No acute bony abnormality. Surgical clips in the neck.  IMPRESSION: 1. Cut off of the right mainstem bronchus suggesting mucous plugging. Associated right lower lobe atelectasis is present. 2. Left lower lobe infiltrate with small left pleural effusion noted. 3. Stable cardiomegaly.  No pulmonary venous congestion.   Electronically Signed   By: Maisie Fus  Register   On: 03/15/2015 07:55    Medications:  Scheduled Meds: . antiseptic oral rinse  7 mL Mouth Rinse QID  . aspirin  300 mg Rectal Daily  . chlorhexidine gluconate  15 mL Mouth Rinse BID  . enoxaparin (LOVENOX) injection  30 mg Subcutaneous Q24H  . famotidine  20 mg Per Tube QHS  . fluticasone  2 spray Each Nare Daily  . free water  200 mL Per Tube BID  . haloperidol lactate  2 mg Intravenous QHS,MR X 1  . Immune Globulin 10%  400 mg/kg Intravenous Q24 Hr x 5  . insulin aspart  0-9 Units Subcutaneous 6 times per day  . ipratropium-albuterol  3 mL Nebulization Q6H  . piperacillin-tazobactam (ZOSYN)  IV  3.375 g Intravenous 3 times per day  .  potassium chloride  10 mEq Intravenous Q1 Hr x 4  . pravastatin  10 mg Oral Daily  . predniSONE  20 mg Oral Q breakfast  . pyridostigmine  60 mg Oral 3 times per day  . vancomycin  750 mg Intravenous Q18H   Continuous Infusions: . sodium chloride 40 mL/hr at 03/14/15 1350  . feeding supplement (JEVITY 1.5 CAL/FIBER)     PRN Meds:.ALPRAZolam, hydrALAZINE, ondansetron **OR** ondansetron (ZOFRAN) IV, senna-docusate   Assessment/Plan: 1 Acute Respiratory failure/ Pneumonia; On IV Zosyn Continue O2 and Bronchodilators  2 Dysphagia/ Drooping of left eye:and speech disturbance: Possible Myasthenia. On IVIG,  Pyridostigmine and Prednisone. Will d/w GI regarding EGD 3 Hypokalemia; Replace  4 Anxiety and intermittent agitation; Continue to monitor.Minimize Haldol. 5 Physical Therapy   DVT prophylaxis with Lovenox.  GI prophylaxis with Famotidine Full code   LOS: 6 days   Kaniesha Barile 03/15/2015, 8:17 AM

## 2015-03-15 NOTE — Progress Notes (Signed)
NEUROLOGY NOTE  S: Dobhoff unsuccessful today, having respiratory distress  ROS unobtainable to face mask  O: 96.3     165/94     103    24 Mild distress, nl weight Normocephalic, oropharynx clear Supple, no JVD Coarse BS, decreased movement RRR, no murmur No C/C/E   Alert but severe dysarthria, follows commands PERRLA, +corneals good, good cough wihtdrawals equally  A/P: 1. Probable myasthenia gravis- unchanged strength but pt can return to respiratory failure at anytime;  Very weak neck flexion goes along with poor diaphragm function 2. Encephalopathy- resolved -  Agree with re-intubation today -  Mestinon  q6h iV today will arrive from [facility] - Continue IVIG 0.4mg /kg daily x 5 days even though PLEX is better for MG - Avoid HALDOL for agitation, PRN ativan or Geodon ok - OG attempt by Dr. Belia Heman - Continue Prednisone  daily PO once PO route is re-established, would be careful with IV steroids as they can worsen current state - Avoid flouroquinolones and imipenums when treating aspiration because these worsen MG - Labs are still pending - Will follow closely   36 minutes of critical care time spent, examining pt, reviewing chart and coordinating care

## 2015-03-15 NOTE — Progress Notes (Signed)
Dr. Hyacinth Meeker paged regarding potassium of 2.9. Waiting for call back.

## 2015-03-15 NOTE — Progress Notes (Signed)
eLink Physician-Brief Progress Note Patient Name: Cheyenne Boone DOB: 1924/01/03 MRN: [id number]   Date of Service  03/15/2015  HPI/Events of Note  Call from Dr Marcello Fennel 2nd time - says aptient family really wants to transfer to [place]  eICU Interventions  [facility] to [facility]     Intervention Category Intermediate Interventions: Other:  [name] 03/15/2015, 6:17 PM

## 2015-03-15 NOTE — Progress Notes (Addendum)
D/w Dr Belia Heman. [facility] am [facility]MD at [facility]  They have IM Mestinon. Due to large hiatal hernia likely will need GI to place OG. Depending on course, if needs PLEX then [facility]  Hold off on [facility] transfer  Dr. Kalman Shan, M.D., [facility].C.P Pulmonary and Critical Care Medicine Staff Physician [facility] [place] [facility] Pager: [phone], If no answer or between  15:00h - 7:00h: call [phone]  03/15/2015 3:39 PM

## 2015-03-15 NOTE — H&P (Signed)
PULMONARY / CRITICAL CARE MEDICINE   Name: Cheyenne Boone MRN: [id number] DOB: [birth year]    ADMISSION DATE:  03/15/2015 CONSULTATION DATE:  03/15/2015  REFERRING MD :  Jamison Neighbor  CHIEF COMPLAINT:  Dysphagia   INITIAL PRESENTATION: 81 year old female with 10 day history of dysphagia. Was admitted to [facility] for what was suspected to be myasthenia gravis flare. She was started on on IVIG and mestinon, however eventually required intubation. Transferred to [facility] at family request and for possible PLEX. PCCM to admit.   STUDIES:  9/27 CT head - Stable noncontrast CT appearance of the brain since May. No acute intracranial abnormality  9/27 Barium swallow - Nondiagnostic barium swallow examination due to the patient's inability to more than tiny amounts of the barium, Possible mass in the AP window. Possible infrahilar pneumonia on the left.  9/27 CT chest - Significant coronary artery disease, mitral annulus and aortic valve disease. Tortuous aorta without aneurysm. Large hiatal hernia. No associated obstruction.  9/27 CT soft tissue neck - Moderate calcified plaque is noted in proximal right internal carotid artery; carotid ultrasound is recommended for further evaluation. Fluid is noted in the right mastoid air cells. Clinical correlation is recommended to rule out mastoiditis.  9/28 MRI brain - No acute intracranial abnormality. Chronic small vessel ischemic disease is stable since 2015. Chronic cervical spine degeneration with C3-C4 and C4-C5 spinal stenosis which does not appear significantly progressed since 2015.  9/28 Carotid doppler - Right carotid bifurcation and proximal ICA plaque, resulting in less than 50% diameter stenosis. The exam does not exclude plaque ulceration or embolization.  SIGNIFICANT EVENTS: 9/27 - Admit for dysphagia  9/29 - Transfer to ICU @ [facility] for respiratory failure & intubated>>Bronchoscopy with bilateral secretions & plugging 10/01 - Extubated 10/03 -  Reintubated 10/03 - Transfer to [facility] from [facility]  HISTORY OF PRESENT ILLNESS:  81 year old female with PMH as below, which includes HTN, HLD, glaucoma, and CVA. She was admitted to [facility] 9/27 with c/o dysphagia x 10 days. She uses walker and lives alone, but next door to her daughter who helps take care of her. She had reportedly not been eating or drinking well due to difficulty swallowing. She would reportedly attempt to swallow, however, food would get stuck in her throat, and then come right back up. In ED she was also noted to have trouble getting words out. Daughter also said that 2 days PTA she developed blurry vision and left eye drooping. She was admitted to the hospitalist team and evaluated by the gastroenterology team and neurology. GI was planning for EGD, however, once Neurology diagnosed probably myesthesia, GI decided to wait.  Mestinon, prednisone were started. NGT was placed for nutrition. 9/29 course complicated by respiratory failure requiring ICU admission and emergent intubation. 9/30 she was extubated. 10/1 NGT was pulled out by patient, and was unable to be replaced, likely due to large hiatal hernia. Also started on IVIG. Copious oral secretions post extubation, started on broad ABX for presumed aspiration PNA. 10/3 required re-intubation for overall weakness. She was transferred to [facility] at family request and for possible PLEX. PCCM to admit.   PAST MEDICAL HISTORY :   has a past medical history of Hypertension; Hypercholesteremia; and Stroke (HCC).  has past surgical history that includes Tonsillectomy and Femur IM nail (Left, 11/01/2014). Prior to Admission medications   Medication Sig Start Date End Date Taking? Authorizing Provider  docusate sodium (COLACE) 100 MG capsule Take 100 mg by mouth  daily as needed for mild constipation.    Historical Provider, MD  fluticasone (FLONASE) 50 MCG/ACT nasal spray Place 2 sprays into both nostrils daily. 02/28/15 02/28/16  Tommi Rumps, PA-C  HYDROcodone-acetaminophen (NORCO/VICODIN) 5-325 MG per tablet Take 1-2 tablets by mouth every 6 (six) hours as needed for moderate pain. 11/04/14   Evon Slack, PA-C  latanoprost (XALATAN) 0.005 % ophthalmic solution Place 1 drop into the left eye at bedtime.    Historical Provider, MD  polyethylene glycol (MIRALAX / GLYCOLAX) packet Take 17 g by mouth daily as needed for mild constipation or moderate constipation. 11/05/14   Barbette Reichmann, MD   No Known Allergies  FAMILY HISTORY:  indicated that her mother is deceased. She indicated that her father is deceased.  SOCIAL HISTORY:  reports that she has never smoked. She does not have any smokeless tobacco history on file. She reports that she does not drink alcohol or use illicit drugs.  REVIEW OF SYSTEMS:  Unable to obtain secondary to intubation & sedation.  SUBJECTIVE:   VITAL SIGNS: Temp:  [94.6 F (34.8 C)-98.2 F (36.8 C)] 97.5 F (36.4 C) (10/03 2253) Pulse Rate:  [72-120] 95 (10/03 2237) Resp:  [6-42] 27 (10/03 2237) BP: (80-180)/(57-112) 156/72 mmHg (10/03 2235) SpO2:  [92 %-100 %] 94 % (10/03 2237) FiO2 (%):  [30 %-75 %] 40 % (10/03 2309) Weight:  [49.1 kg (108 lb 3.9 oz)-52.6 kg (115 lb 15.4 oz)] 49.1 kg (108 lb 3.9 oz) (10/03 2237) HEMODYNAMICS:   VENTILATOR SETTINGS: Vent Mode:  [-] PRVC FiO2 (%):  [30 %-75 %] 40 % Set Rate:  [14 bmp-16 bmp] 16 bmp Vt Set:  [350 mL-400 mL] 400 mL PEEP:  [5 cmH20] 5 cmH20 INTAKE / OUTPUT: No intake or output data in the 24 hours ending 03/15/15 2344  PHYSICAL EXAMINATION: General:  Elderly female on vent Neuro:  Alert, comfortable appearing on vent. Does not move extremities to command.  HEENT:  [place]/AT, no JVD, PERRL Cardiovascular:  Tachy, regular Lungs:  Diminished R base Abdomen:  Soft, non-tender, non-distended Musculoskeletal:  No acute deformity or ROM limitation Skin:  Grossly intact  LABS:  CBC  Recent Labs Lab 03/11/15 1615 03/13/15 0422  03/15/15 0611  WBC 21.2* 11.9* 9.6  HGB 12.8 12.2 13.7  HCT 39.4 36.2 40.2  PLT 285 190 255   Coag's  Recent Labs Lab 03/10/15 1420  INR 1.05   BMET  Recent Labs Lab 03/13/15 0422 03/14/15 0127 03/15/15 0611 03/15/15 1437  NA 142  --  141 135  K 3.2*  --  2.9* 3.3*  CL 109  --  107 102  CO2 26  --  25 25  BUN 21*  --  13 15  CREATININE 1.11* 0.99 0.90 0.94  GLUCOSE 133*  --  135* 107*   Electrolytes  Recent Labs Lab 03/13/15 0422 03/15/15 0611 03/15/15 1437  CALCIUM 9.0 9.1 8.9  MG 1.7  --  1.8  PHOS 2.2*  --  2.4*   Sepsis Markers  Recent Labs Lab 03/11/15 1216 03/12/15 0520 03/13/15 0422  PROCALCITON 0.10 0.47 0.63   ABG  Recent Labs Lab 03/12/15 1600 03/13/15 0350 03/14/15 0920  PHART 7.32* 7.43 7.46*  PCO2ART 42 35 37  PO2ART 107 171* 50*   Liver Enzymes  Recent Labs Lab 03/11/15 1615  AST 25  ALT 12*  ALKPHOS 94  BILITOT 1.6*  ALBUMIN 3.5   Cardiac Enzymes  Recent Labs Lab 03/11/15 1615 03/11/15 2302  03/12/15 0520  TROPONINI <0.03 <0.03 <0.03   Glucose  Recent Labs Lab 03/14/15 1944 03/14/15 2348 03/15/15 0335 03/15/15 0714 03/15/15 1118 03/15/15 1757  GLUCAP 114* 134* 120* 149* 128* 105*    Imaging Dg Chest 1 View  03/15/2015   CLINICAL DATA:  Shortness of breath.  EXAM: CHEST 1 VIEW  COMPARISON:  03/13/2015.  FINDINGS: Mediastinum is stable. Cut off of the right mainstem bronchus is noted. This may be from mucous plugging. Associated atelectasis is present in the right lower lobe. Left lower lobe infiltrate with small left pleural effusion. No pneumothorax. Cardiomegaly. No acute bony abnormality. Surgical clips in the neck.  IMPRESSION: 1. Cut off of the right mainstem bronchus suggesting mucous plugging. Associated right lower lobe atelectasis is present. 2. Left lower lobe infiltrate with small left pleural effusion noted. 3. Stable cardiomegaly.  No pulmonary venous congestion.   Electronically Signed   By:  Maisie Fus  Register   On: 03/15/2015 07:55   Dg Fluoro Rm 1-60 Min  03/15/2015   CLINICAL DATA:  Feeding tube placement.  EXAM: FLOURO RM 1-60 MIN  CONTRAST:  None.  FLUOROSCOPY TIME:  Entrance dose 125.5 mGy  Scratched  COMPARISON:  03/15/2015, 03/11/2015 scratched  FINDINGS: Multiple attempts were made to pass a Dobhoff tube below the left hemidiaphragm. Prominent hiatal hernia is present. The Dobhoff tube could not be passed below the left hemi diaphragm after innumerable attempts. Tube was pulled and exam terminated. Patient left in stable condition.  IMPRESSION: Inability to place double tube tip below left hemidiaphragm due to large hiatal hernia.   Electronically Signed   By: Maisie Fus  Register   On: 03/15/2015 10:37   Dg Chest Port 1 View  03/15/2015   CLINICAL DATA:  Endotracheal tube placement  EXAM: PORTABLE CHEST 1 VIEW  COMPARISON:  1750 hours  FINDINGS: Endotracheal tube and left jugular central venous catheter are stable. NG tube has been removed. Right pleural effusion and basilar opacity are stable. Findings remain worrisome for right lower lobe collapse. Hazy airspace disease at the left base have worsened.  IMPRESSION: NG tube removed.  Worsening airspace disease at the left base.  Otherwise stable.   Electronically Signed   By: Jolaine Click M.D.   On: 03/15/2015 23:17   Dg Chest Port 1 View  03/15/2015   CLINICAL DATA:  Left IJ central line placement.  Initial encounter.  EXAM: PORTABLE CHEST 1 VIEW  COMPARISON:  Earlier the same date.  CT 03/09/2015.  FINDINGS: 1751 hours. There is persistent partial right lung collapse with mediastinal shift to the right. Tip of the endotracheal tube is unchanged, approximately 2 cm above the carina. Nasogastric tube tip remains in the mid esophagus (patient has a hiatal hernia on prior CT). There is a new left IJ central venous catheter, extending to the level of the SVC right atrial junction allowing for the mediastinal shift. No evidence of pneumothorax.  The left lung is clear.  IMPRESSION: 1. Left IJ central venous catheter placement as described, tip at the SVC right atrial junction level. No evidence of pneumothorax. 2. Nasogastric tube tip remains at the mid esophageal level, possibly in the proximal aspect of a hiatal hernia. 3. Persistent partial right lung collapse with mediastinal shift.   Electronically Signed   By: Carey Bullocks M.D.   On: 03/15/2015 18:20   Dg Chest Port 1 View  03/15/2015   CLINICAL DATA:  Status post intubation  EXAM: PORTABLE CHEST - 1 VIEW  COMPARISON:  03/15/2015  FINDINGS: Endotracheal tube is now seen approximately 2.1 cm above the carina. A nasogastric catheter is noted in the midesophagus. This could be advanced several cm into the stomach. Patient is rotated to the right accentuating the mediastinal markings in tortuosity of the thoracic aorta. Cardiac shadow is stable. Stable right perihilar and right basilar consolidation.  IMPRESSION: Endotracheal tube 2.1 cm above the carina.  The remainder the exam is stable. Nasogastric catheter is noted in the midesophagus.   Electronically Signed   By: Alcide Clever M.D.   On: 03/15/2015 14:13     ASSESSMENT / PLAN:  PULMONARY OETT 9/29 > 10/1, 10/3 >>> A: Acute respiratory failure in setting myasthenia gravis flare Mucus plugging - S/P bronch & BAL 9/29  P:   Full vent support ABG VAP bundle SBT daily Will need to check NIF / FVC prior to extubation Albuterol nebs q6hr Chest PT q4hr Pulling back ETT 1-2 cm Repeat CXR in AM to ensure improved aeration   CARDIOVASCULAR CVL LIJ 10/3 > A:  H/O HTN H/O HLD  P:  Monitor on telemetry PRN hydralazine  RENAL A:   Acute Renal Failure - UOP decreasing Hypokalemia - resolved Hyperphosphatemia  P:   NS bolus Monitor UOP with fole Trending renal function daily with BUN/Creatinine Monitoring electrolyte panel daily  GASTROINTESTINAL A:   Large hiatal hernia Dysphagia  P:   Protonix IV daily for  prophylaxis NPO for now Will require NGT placed by GI, IR at [facility] unable to place dobhoff.  Plan to call GI in AM  HEMATOLOGIC A:   No acute issues  P:  Heparin for VTE prophylaxis SCDs  INFECTIOUS A:   Leukocytosis in setting oral steroids Aspiration PNA - H parahaemolytics on BAL  P:   BCx2 9/29 >>> BAL 9/29 - H parahemolyticus UC - neg  Abx:  Unasyn, start date 10/04>>> Zosyn, start date 9/29 >>>10/04 Vancomycin, start date 9/29 >>>10/04  ENDOCRINE A:   Hyperglycemia  P:   Accuchecks q4hr Low dose SSI algorithm  NEUROLOGIC A:   Myasthenia gravis Medical sedation  P:   RASS goal: -1 Fentanyl infusion PRN versed Plan to consult neurology in AM Will hold IVIG from [facility] until seen by Neurology Continue Mestinon  IV q6hr Holding Solu-Medrol Will likely require PLEX Avoid flouroquinolones and imipenums when treating aspiration because these worsen MG   FAMILY  - Updates: No family at bedside to update.  - Inter-disciplinary family meet or Palliative Care meeting due by:  10/6   Joneen Roach, AGACNP-BC  Pulmonology/Critical Care Pager [phone] or [phone] 03/16/2015 12:30 AM  PCCM Attending Note: Patient seen and examined with nurse practitioner. Please refer to his admission H&P. Patient admitted to outside hospital with dysphagia. She had acute respiratory failure prompting transition to the intensive care unit at outside hospital on 9/29 with endotracheal intubation. Patient was subsequently extubated but then reintubated yesterday afternoon for worsening respiratory status. Acetylcholine receptor antibody in process at this time. Holding on IV steroids at this time. Continue Mestinon IV. Patient does have evidence of plugging on the right which is consistent with prior bronchoscopy and lavage. Antibiotic coverage has been narrowed for the Haemophilus parahaemolyticus grown from the bronchoalveolar lavage on 9/29.  1. Acute  Respiratory failure: Secondary to neuromuscular weakness & mucus plugging. Continuing chest PT every 4 hours. Albuterol nebulizers every 6 hours. 2. Aspiration pneumonia with Haemophilus parahaemolyticus: Narrowing antibiotics from vancomycin & Zosyn to Unasyn. 3. Neuromuscular weakness: Suspect flare of myasthenia  gravis. plan for neurology consult in the morning. Continuing Mestinon 2 mg IV every 6 hours. Plex versus continued IVIG depending on neurology recommendations. 4. Hyperglycemia: Accu-Cheks every 4 hours with low-dose sliding scale algorithm. 5. Large hiatal hernia: It has been difficult to obtain enteral feeding tube placement. Plan to consult GI in the morning for placement of such tube.  I have spent a total of 37 minutes of critical care time today caring for the patient and reviewing the patient's electronic medical record.  Donna Christen Jamison Neighbor, M.D. [facility] Pager:  [phone] After 3pm or if no response, call [phone]

## 2015-03-15 NOTE — Progress Notes (Signed)
Speech Therapy Note: reviewed chart notes. Pt had been extubated but was having increased WOB this morning. Pt has now been re-intubated d/t declined respiratory status today. Noted attempt at NG feeding tube placement as well per chart notes. GI continues to follow d/t large hernia and potential endoscopy when appropriate. ST will sign off at this time d/t pt's declined medical and respiratory status'. Pt remains NPO w/ oral care for hygiene and stim. MD to reconsult ST services when appropriate. NSG updated and agreed.

## 2015-03-15 NOTE — Consult Note (Signed)
Endotracheal Intubation: Patient required placement of an artificial airway secondary to resp failure.   Consent: Emergent.   Hand washing performed prior to starting the procedure.   Medications administered for sedation prior to procedure: Midazolam 2 mg IV,  Vecuronium 10 mg IV, Fentanyl 100 mcg IV.   Procedure: A time out procedure was called and correct patient, name, & ID confirmed. Needed supplies and equipment were assembled and checked to include ETT, 10 ml syringe, Glidescope, Mac and Miller blades, suction, oxygen and bag mask valve, end tidal CO2 monitor. Patient was positioned to align the mouth and pharynx to facilitate visualization of the glottis.  Heart rate, SpO2 and blood pressure was continuously monitored during the procedure. Pre-oxygenation was conducted prior to intubation and endotracheal tube was placed through the vocal cords into the trachea.  During intubation an assistant applied gentle pressure to the cricoid cartilage.   The artificial airway was placed under direct visualization via glidescope route using a 7.5 ETT on the first attempt.    ETT was secured at 22 cm mark.    Placement was confirmed by auscuitation of lungs with good breath sounds bilaterally and no stomach sounds.  Condensation was noted on endotracheal tube.  Pulse ox 90%.  CO2 detector in place with appropriate color change.   Complications: None .   Operator: Thai Burgueno.   Chest radiograph ordered and pending.   Comments: OGT placed via glidescope.  Lucie Leather, M.D.  [facility]  Medical Director [facility] Medical Director [facility] Cardio-Pulmonary Department

## 2015-03-15 NOTE — Progress Notes (Signed)
Patient's rectal temp. 94.2. Bair Hugger applied.

## 2015-03-15 NOTE — Progress Notes (Signed)
Spoke with Dr Marva Panda regarding inability to insert DHT under fluro.  He will call Dr Marcello Fennel to discuss next plans for pt.

## 2015-03-15 NOTE — Care Management Note (Signed)
Case Management Note  Patient Details  Name: Cheyenne Boone MRN: [id number] Date of Birth: 1924-06-07  Subjective/Objective:  Extubated and required re intubation secondary to respiratory failure.                   Action/Plan:   Expected Discharge Date:                  Expected Discharge Plan:  Skilled Nursing Facility  In-House Referral:  Clinical Social Work  Discharge planning Services  CM Consult  Post Acute Care Choice:    Choice offered to:     DME Arranged:    DME Agency:     HH Arranged:    HH Agency:     Status of Service:  In process, will continue to follow  Medicare Important Message Given:  Yes-third notification given Date Medicare IM Given:    Medicare IM give by:    Date Additional Medicare IM Given:    Additional Medicare Important Message give by:     If discussed at Long Length of Stay Meetings, dates discussed:    Additional Comments:  Marily Memos, RN 03/15/2015, 1:51 PM

## 2015-03-15 NOTE — Progress Notes (Signed)
Nutrition Follow-up     INTERVENTION:   Coordination of Care: failed dobhoff placement, noted plan for attempted OG placement; if able to place, recommend initiation of EN. If unable to gain access for EN and pt remains intubated/NPO, may to need to consider initiation of TPN. Will continue to assess  NUTRITION DIAGNOSIS:   Inadequate oral intake related to dysphagia, acute illness as evidenced by NPO status, per patient/family report.  GOAL:   Provide needs based on ASPEN/SCCM guidelines  MONITOR:    (Energy Intake, Anthropometrics, Electrolyte/Renal Profile, Digestive System, Pulmonary)  REASON FOR ASSESSMENT:   Malnutrition Screening Tool, Ventilator, Diagnosis    ASSESSMENT:    Dobhoff attempted under fluoro today but was unsuccessful; pt developed respiratory distress and was subsequently re-intubated, plan for OG attempt per MD notes  Diet Order:  Diet NPO time specified  Skin:  Reviewed, no issues   Electrolyte and Renal Profile:  Recent Labs Lab 03/13/15 0422 03/14/15 0127 03/15/15 0611 03/15/15 1437  BUN 21*  --  13 15  CREATININE 1.11* 0.99 0.90 0.94  NA 142  --  141 135  K 3.2*  --  2.9* 3.3*  MG 1.7  --   --  1.8  PHOS 2.2*  --   --  2.4*   Glucose Profile:  Recent Labs  03/15/15 0335 03/15/15 0714 03/15/15 1118  GLUCAP 120* 149* 128*   Meds: NS at 40 ml/hr, ss novolog, prednisone   Height:   Ht Readings from Last 1 Encounters:  03/09/15  (1.499 m)    Weight:   Wt Readings from Last 1 Encounters:  03/15/15 115 lb 15.4 oz (52.6 kg)    Filed Weights   03/13/15 0500 03/14/15 0459 03/15/15 0453  Weight: 106 lb 4.2 oz (48.2 kg) 115 lb 15.4 oz (52.6 kg) 115 lb 15.4 oz (52.6 kg)    BMI:  Body mass index is 23.41 kg/(m^2).  Estimated Nutritional Needs:   Kcal:  BEE: 817kcals, TEE: (IF 1.1-1.3)(AF1.2) [id number]  Protein:  48-58g protein (1.0-1.2g/kg)  Fluid:  1200-1416mL of fluid (25-71mL/kg)  HIGH Care Level  Romelle Starcher MS, RD, LDN [phone] Pager

## 2015-03-15 NOTE — Progress Notes (Signed)
This am pt unable to swallow adequately for any meds or po intake.  Attempted to insert DHT for meds via fluroscopy.  Several tries were made but  DHT coiled in hernia and back into mouth. We awaited arrival of IM mestinon from [facility].  At 1300, Pt was intubated  due to feeling she was smothering and unable to breathe.  Accessory muscle use marked. Dr Katrinka Blazing (nuero) and Dr Belia Heman (pulmonary) here decided to intubate. Pt and family in agreement to intubate.   IM Mestinon arrived and was given.Currently, pt sats well on vent 55%. Fentanyl at 50 mcg.  Pt opens eyes at times and withdraws to pain. A triple lumen was inserted into left IJ  And placement verified by xray.  Daughters have wanted her transferred to [facility] for Maximum care of Myasthenia Gravis.  Dr Marcello Fennel is arranging transfer. Care Link aware .Awaiting bed assignment.

## 2015-03-15 NOTE — Progress Notes (Signed)
Pt to fluroscopy for DHT insertion. Radiologist unsuccessful with placement due to hernia causing recoil into oropharynx numerous times. Pt tolerated fairly well. Dr Marcello Fennel notified

## 2015-03-15 NOTE — Consult Note (Signed)
Subjective: Patient seen for dysphagia. Patient reintubated today after respiratory deficiency/failure. Currently sedated.  Objective: Vital signs in last 24 hours: Temp:  [95.2 F (35.1 C)-98.4 F (36.9 C)] 97.1 F (36.2 C) (10/03 1800) Pulse Rate:  [72-120] 90 (10/03 2000) Resp:  [6-42] 24 (10/03 2000) BP: (80-180)/(57-142) 88/57 mmHg (10/03 2000) SpO2:  [90 %-100 %] 100 % (10/03 2000) FiO2 (%):  [30 %-75 %] 50 % (10/03 1800) Weight:  [52.6 kg (115 lb 15.4 oz)] 52.6 kg (115 lb 15.4 oz) (10/03 0453) Blood pressure 88/57, pulse 90, temperature 97.1 F (36.2 C), temperature source Axillary, resp. rate 24, height  (1.499 m), weight 52.6 kg (115 lb 15.4 oz), SpO2 100 %.   Intake/Output from previous day: 10/02 0701 - 10/03 0700 In: 1510 [I.V.:1160; IV Piggyback:350] Out: 1610 [Urine:1610]  Intake/Output this shift:     General appearance:  81 year old female on mechanical ventilator Resp:  Occasional coarse rhonchi Cardio:  Regular rate and rhythm GI:  Soft apparently nontender nondistended Extremities:     Lab Results: Results for orders placed or performed during the hospital encounter of 03/09/15 (from the past 24 hour(s))  Glucose, capillary     Status: Abnormal   Collection Time: 03/14/15 11:48 PM  Result Value Ref Range   Glucose-Capillary 134 (H) 65 - 99 mg/dL  Glucose, capillary     Status: Abnormal   Collection Time: 03/15/15  3:35 AM  Result Value Ref Range   Glucose-Capillary 120 (H) 65 - 99 mg/dL  CBC     Status: None   Collection Time: 03/15/15  6:11 AM  Result Value Ref Range   WBC 9.6 3.6 - 11.0 K/uL   RBC 4.43 3.80 - 5.20 MIL/uL   Hemoglobin 13.7 12.0 - 16.0 g/dL   HCT 69.6 29.5 - 28.4 %   MCV 90.7 80.0 - 100.0 fL   MCH 31.0 26.0 - 34.0 pg   MCHC 34.2 32.0 - 36.0 g/dL   RDW 13.2 44.0 - 10.2 %   Platelets 255 150 - 440 K/uL  Basic metabolic panel     Status: Abnormal   Collection Time: 03/15/15  6:11 AM  Result Value Ref Range   Sodium 141  135 - 145 mmol/L   Potassium 2.9 (LL) 3.5 - 5.1 mmol/L   Chloride 107 101 - 111 mmol/L   CO2 25 22 - 32 mmol/L   Glucose, Bld 135 (H) 65 - 99 mg/dL   BUN 13 6 - 20 mg/dL   Creatinine, Ser 7.25 0.44 - 1.00 mg/dL   Calcium 9.1 8.9 - 36.6 mg/dL   GFR calc non Af Amer 55 (L) >60 mL/min   GFR calc Af Amer >60 >60 mL/min   Anion gap 9 5 - 15  Glucose, capillary     Status: Abnormal   Collection Time: 03/15/15  7:14 AM  Result Value Ref Range   Glucose-Capillary 149 (H) 65 - 99 mg/dL  Glucose, capillary     Status: Abnormal   Collection Time: 03/15/15 11:18 AM  Result Value Ref Range   Glucose-Capillary 128 (H) 65 - 99 mg/dL  Basic metabolic panel     Status: Abnormal   Collection Time: 03/15/15  2:37 PM  Result Value Ref Range   Sodium 135 135 - 145 mmol/L   Potassium 3.3 (L) 3.5 - 5.1 mmol/L   Chloride 102 101 - 111 mmol/L   CO2 25 22 - 32 mmol/L   Glucose, Bld 107 (H) 65 - 99 mg/dL  BUN 15 6 - 20 mg/dL   Creatinine, Ser 6.21 0.44 - 1.00 mg/dL   Calcium 8.9 8.9 - 30.8 mg/dL   GFR calc non Af Amer 53 (L) >60 mL/min   GFR calc Af Amer >60 >60 mL/min   Anion gap 8 5 - 15  Magnesium     Status: None   Collection Time: 03/15/15  2:37 PM  Result Value Ref Range   Magnesium 1.8 1.7 - 2.4 mg/dL  Phosphorus     Status: Abnormal   Collection Time: 03/15/15  2:37 PM  Result Value Ref Range   Phosphorus 2.4 (L) 2.5 - 4.6 mg/dL  Glucose, capillary     Status: Abnormal   Collection Time: 03/15/15  5:57 PM  Result Value Ref Range   Glucose-Capillary 105 (H) 65 - 99 mg/dL      Recent Labs  65/78/46 0422 03/15/15 0611  WBC 11.9* 9.6  HGB 12.2 13.7  HCT 36.2 40.2  PLT 190 255   BMET  Recent Labs  03/13/15 0422 03/14/15 0127 03/15/15 0611 03/15/15 1437  NA 142  --  141 135  K 3.2*  --  2.9* 3.3*  CL 109  --  107 102  CO2 26  --  25 25  GLUCOSE 133*  --  135* 107*  BUN 21*  --  13 15  CREATININE 1.11* 0.99 0.90 0.94  CALCIUM 9.0  --  9.1 8.9   LFT No results for  input(s): PROT, ALBUMIN, AST, ALT, ALKPHOS, BILITOT, BILIDIR, IBILI in the last 72 hours. PT/INR No results for input(s): LABPROT, INR in the last 72 hours. Hepatitis Panel No results for input(s): HEPBSAG, HCVAB, HEPAIGM, HEPBIGM in the last 72 hours. C-Diff No results for input(s): CDIFFTOX in the last 72 hours. No results for input(s): CDIFFPCR in the last 72 hours.   Studies/Results: Dg Chest 1 View  03/15/2015   CLINICAL DATA:  Shortness of breath.  EXAM: CHEST 1 VIEW  COMPARISON:  03/13/2015.  FINDINGS: Mediastinum is stable. Cut off of the right mainstem bronchus is noted. This may be from mucous plugging. Associated atelectasis is present in the right lower lobe. Left lower lobe infiltrate with small left pleural effusion. No pneumothorax. Cardiomegaly. No acute bony abnormality. Surgical clips in the neck.  IMPRESSION: 1. Cut off of the right mainstem bronchus suggesting mucous plugging. Associated right lower lobe atelectasis is present. 2. Left lower lobe infiltrate with small left pleural effusion noted. 3. Stable cardiomegaly.  No pulmonary venous congestion.   Electronically Signed   By: Maisie Fus  Register   On: 03/15/2015 07:55   Dg Fluoro Rm 1-60 Min  03/15/2015   CLINICAL DATA:  Feeding tube placement.  EXAM: FLOURO RM 1-60 MIN  CONTRAST:  None.  FLUOROSCOPY TIME:  Entrance dose 125.5 mGy  Scratched  COMPARISON:  03/15/2015, 03/11/2015 scratched  FINDINGS: Multiple attempts were made to pass a Dobhoff tube below the left hemidiaphragm. Prominent hiatal hernia is present. The Dobhoff tube could not be passed below the left hemi diaphragm after innumerable attempts. Tube was pulled and exam terminated. Patient left in stable condition.  IMPRESSION: Inability to place double tube tip below left hemidiaphragm due to large hiatal hernia.   Electronically Signed   By: Maisie Fus  Register   On: 03/15/2015 10:37   Dg Chest Port 1 View  03/15/2015   CLINICAL DATA:  Left IJ central line  placement.  Initial encounter.  EXAM: PORTABLE CHEST 1 VIEW  COMPARISON:  Earlier the same  date.  CT 03/09/2015.  FINDINGS: 1751 hours. There is persistent partial right lung collapse with mediastinal shift to the right. Tip of the endotracheal tube is unchanged, approximately 2 cm above the carina. Nasogastric tube tip remains in the mid esophagus (patient has a hiatal hernia on prior CT). There is a new left IJ central venous catheter, extending to the level of the SVC right atrial junction allowing for the mediastinal shift. No evidence of pneumothorax. The left lung is clear.  IMPRESSION: 1. Left IJ central venous catheter placement as described, tip at the SVC right atrial junction level. No evidence of pneumothorax. 2. Nasogastric tube tip remains at the mid esophageal level, possibly in the proximal aspect of a hiatal hernia. 3. Persistent partial right lung collapse with mediastinal shift.   Electronically Signed   By: Carey Bullocks M.D.   On: 03/15/2015 18:20   Dg Chest Port 1 View  03/15/2015   CLINICAL DATA:  Status post intubation  EXAM: PORTABLE CHEST - 1 VIEW  COMPARISON:  03/15/2015  FINDINGS: Endotracheal tube is now seen approximately 2.1 cm above the carina. A nasogastric catheter is noted in the midesophagus. This could be advanced several cm into the stomach. Patient is rotated to the right accentuating the mediastinal markings in tortuosity of the thoracic aorta. Cardiac shadow is stable. Stable right perihilar and right basilar consolidation.  IMPRESSION: Endotracheal tube 2.1 cm above the carina.  The remainder the exam is stable. Nasogastric catheter is noted in the midesophagus.   Electronically Signed   By: Alcide Clever M.D.   On: 03/15/2015 14:13    Scheduled Inpatient Medications:   . antiseptic oral rinse  7 mL Mouth Rinse QID  . aspirin  300 mg Rectal Daily  . chlorhexidine gluconate  15 mL Mouth Rinse BID  . enoxaparin (LOVENOX) injection  30 mg Subcutaneous Q24H  .  famotidine  20 mg Per Tube QHS  . fentaNYL      . fluticasone  2 spray Each Nare Daily  . free water  200 mL Per Tube BID  . haloperidol lactate  2 mg Intravenous QHS,MR X 1  . Immune Globulin 10%  400 mg/kg Intravenous Q24 Hr x 5  . insulin aspart  0-9 Units Subcutaneous 6 times per day  . ipratropium-albuterol  3 mL Nebulization Q6H  . midazolam      . piperacillin-tazobactam (ZOSYN)  IV  3.375 g Intravenous 3 times per day  . potassium phosphate IVPB (mmol)  30 mmol Intravenous Once  . pravastatin  10 mg Oral Daily  . predniSONE  20 mg Oral Q breakfast  . pyridostigmine  2 mg Intramuscular 4 times per day  . senna-docusate  1 tablet Per Tube BID  . vancomycin  750 mg Intravenous Q18H  . vecuronium  10 mg Intravenous Once    Continuous Inpatient Infusions:   . sodium chloride 40 mL/hr at 03/15/15 1700  . feeding supplement (JEVITY 1.5 CAL/FIBER)    . fentaNYL infusion INTRAVENOUS 25 mcg/hr (03/15/15 1700)    PRN Inpatient Medications:  ALPRAZolam, haloperidol lactate, hydrALAZINE, ondansetron **OR** ondansetron (ZOFRAN) IV  Miscellaneous:   Assessment:  1. Acute onset dysphagia. Her evaluation indicating myasthenia Gravis. Affect patient is noted per He has had respiratory failure acquiring intubation and possible pneumonia from  aspiration. Multiple neurologic symptoms.   Plan:  1 plans for transfer noted. 2 EGD when clinically feasible.  Christena Deem MD 03/15/2015, 8:50 PM

## 2015-03-15 NOTE — Care Management Important Message (Signed)
Important Message  Patient Details  Name: Cheyenne Boone MRN: [id number] Date of Birth: 09-May-1924   Medicare Important Message Given:  Yes-third notification given    Marily Memos, RN 03/15/2015, 1:07 PM

## 2015-03-16 ENCOUNTER — Inpatient Hospital Stay (HOSPITAL_COMMUNITY): Payer: Commercial Managed Care - HMO

## 2015-03-16 DIAGNOSIS — E43 Unspecified severe protein-calorie malnutrition: Secondary | ICD-10-CM | POA: Diagnosis present

## 2015-03-16 DIAGNOSIS — N179 Acute kidney failure, unspecified: Secondary | ICD-10-CM | POA: Diagnosis present

## 2015-03-16 DIAGNOSIS — J690 Pneumonitis due to inhalation of food and vomit: Secondary | ICD-10-CM | POA: Diagnosis present

## 2015-03-16 DIAGNOSIS — G709 Myoneural disorder, unspecified: Secondary | ICD-10-CM | POA: Diagnosis present

## 2015-03-16 DIAGNOSIS — N170 Acute kidney failure with tubular necrosis: Secondary | ICD-10-CM

## 2015-03-16 DIAGNOSIS — Z452 Encounter for adjustment and management of vascular access device: Secondary | ICD-10-CM

## 2015-03-16 LAB — CBC
HEMATOCRIT: 32.4 % — AB (ref 36.0–46.0)
HEMOGLOBIN: 10.9 g/dL — AB (ref 12.0–15.0)
MCH: 30.7 pg (ref 26.0–34.0)
MCHC: 33.6 g/dL (ref 30.0–36.0)
MCV: 91.3 fL (ref 78.0–100.0)
Platelets: 224 10*3/uL (ref 150–400)
RBC: 3.55 MIL/uL — AB (ref 3.87–5.11)
RDW: 14.9 % (ref 11.5–15.5)
WBC: 9.1 10*3/uL (ref 4.0–10.5)

## 2015-03-16 LAB — BASIC METABOLIC PANEL
ANION GAP: 8 (ref 5–15)
BUN: 15 mg/dL (ref 6–20)
CALCIUM: 8 mg/dL — AB (ref 8.9–10.3)
CO2: 25 mmol/L (ref 22–32)
Chloride: 103 mmol/L (ref 101–111)
Creatinine, Ser: 1.1 mg/dL — ABNORMAL HIGH (ref 0.44–1.00)
GFR calc non Af Amer: 44 mL/min — ABNORMAL LOW (ref >60)
GFR, EST AFRICAN AMERICAN: 50 mL/min — AB (ref >60)
GLUCOSE: 106 mg/dL — AB (ref 65–99)
POTASSIUM: 3.7 mmol/L (ref 3.5–5.1)
Sodium: 136 mmol/L (ref 135–145)

## 2015-03-16 LAB — APTT: APTT: 30 s (ref 24–37)

## 2015-03-16 LAB — TROPONIN I

## 2015-03-16 LAB — POCT I-STAT 3, ART BLOOD GAS (G3+)
Acid-Base Excess: 1 mmol/L (ref 0.0–2.0)
Bicarbonate: 25.2 mEq/L — ABNORMAL HIGH (ref 20.0–24.0)
O2 SAT: 98 %
PCO2 ART: 36.3 mmHg (ref 35.0–45.0)
PO2 ART: 107 mmHg — AB (ref 80.0–100.0)
TCO2: 26 mmol/L (ref 0–100)
pH, Arterial: 7.449 (ref 7.350–7.450)

## 2015-03-16 LAB — SODIUM, URINE, RANDOM: SODIUM UR: 57 mmol/L

## 2015-03-16 LAB — COMPREHENSIVE METABOLIC PANEL
ALBUMIN: 2.7 g/dL — AB (ref 3.5–5.0)
ALT: 26 U/L (ref 14–54)
AST: 41 U/L (ref 15–41)
Alkaline Phosphatase: 94 U/L (ref 38–126)
Anion gap: 8 (ref 5–15)
BILIRUBIN TOTAL: 1.3 mg/dL — AB (ref 0.3–1.2)
BUN: 15 mg/dL (ref 6–20)
CHLORIDE: 100 mmol/L — AB (ref 101–111)
CO2: 27 mmol/L (ref 22–32)
Calcium: 8.7 mg/dL — ABNORMAL LOW (ref 8.9–10.3)
Creatinine, Ser: 1.02 mg/dL — ABNORMAL HIGH (ref 0.44–1.00)
GFR calc Af Amer: 55 mL/min — ABNORMAL LOW (ref >60)
GFR calc non Af Amer: 48 mL/min — ABNORMAL LOW (ref >60)
GLUCOSE: 170 mg/dL — AB (ref 65–99)
POTASSIUM: 3.9 mmol/L (ref 3.5–5.1)
SODIUM: 135 mmol/L (ref 135–145)
TOTAL PROTEIN: 6.7 g/dL (ref 6.5–8.1)

## 2015-03-16 LAB — PROCALCITONIN: Procalcitonin: 0.38 ng/mL

## 2015-03-16 LAB — MRSA PCR SCREENING: MRSA BY PCR: NEGATIVE

## 2015-03-16 LAB — URINALYSIS, ROUTINE W REFLEX MICROSCOPIC
GLUCOSE, UA: NEGATIVE mg/dL
HGB URINE DIPSTICK: NEGATIVE
KETONES UR: NEGATIVE mg/dL
Leukocytes, UA: NEGATIVE
Nitrite: NEGATIVE
PROTEIN: 30 mg/dL — AB
Specific Gravity, Urine: 1.021 (ref 1.005–1.030)
UROBILINOGEN UA: 1 mg/dL (ref 0.0–1.0)
pH: 5.5 (ref 5.0–8.0)

## 2015-03-16 LAB — CORTISOL: Cortisol, Plasma: 30 ug/dL

## 2015-03-16 LAB — URINE MICROSCOPIC-ADD ON

## 2015-03-16 LAB — BRAIN NATRIURETIC PEPTIDE: B Natriuretic Peptide: 314.2 pg/mL — ABNORMAL HIGH (ref 0.0–100.0)

## 2015-03-16 LAB — AMYLASE: AMYLASE: 92 U/L (ref 28–100)

## 2015-03-16 LAB — MAGNESIUM
Magnesium: 1.7 mg/dL (ref 1.7–2.4)
Magnesium: 1.5 mg/dL — ABNORMAL LOW (ref 1.7–2.4)

## 2015-03-16 LAB — OSMOLALITY, URINE: Osmolality, Ur: 549 mOsm/kg (ref 390–1090)

## 2015-03-16 LAB — LIPASE, BLOOD: LIPASE: 64 U/L — AB (ref 22–51)

## 2015-03-16 LAB — GLUCOSE, CAPILLARY
GLUCOSE-CAPILLARY: 141 mg/dL — AB (ref 65–99)
GLUCOSE-CAPILLARY: 111 mg/dL — AB (ref 65–99)
Glucose-Capillary: 113 mg/dL — ABNORMAL HIGH (ref 65–99)

## 2015-03-16 LAB — PHOSPHORUS
PHOSPHORUS: 5.2 mg/dL — AB (ref 2.5–4.6)
Phosphorus: 7.2 mg/dL — ABNORMAL HIGH (ref 2.5–4.6)

## 2015-03-16 LAB — PROTIME-INR
INR: 1.02 (ref 0.00–1.49)
Prothrombin Time: 13.6 seconds (ref 11.6–15.2)

## 2015-03-16 LAB — LACTIC ACID, PLASMA: LACTIC ACID, VENOUS: 1.4 mmol/L (ref 0.5–2.0)

## 2015-03-16 MED ORDER — MAGNESIUM SULFATE 4 GM/100ML IV SOLN
4.0000 g | Freq: Once | INTRAVENOUS | Status: AC
  Administered 2015-03-16: 4 g via INTRAVENOUS
  Filled 2015-03-16: qty 100

## 2015-03-16 MED ORDER — MIDAZOLAM HCL 2 MG/2ML IJ SOLN: 1.0000 mg | INTRAMUSCULAR | Status: DC | PRN

## 2015-03-16 MED ORDER — LACTATED RINGERS IV SOLN
INTRAVENOUS | Status: AC
  Administered 2015-03-16: 250 mL/h via INTRAVENOUS

## 2015-03-16 MED ORDER — CHLORHEXIDINE GLUCONATE 0.12% ORAL RINSE (MEDLINE KIT)
15.0000 mL | Freq: Two times a day (BID) | OROMUCOSAL | Status: DC
  Administered 2015-03-16 – 2015-03-28 (×24): 15 mL via OROMUCOSAL
  Filled 2015-03-16: qty 15

## 2015-03-16 MED ORDER — ALBUTEROL SULFATE (2.5 MG/3ML) 0.083% IN NEBU
2.5000 mg | INHALATION_SOLUTION | Freq: Four times a day (QID) | RESPIRATORY_TRACT | Status: DC
  Administered 2015-03-16 – 2015-03-18 (×9): 2.5 mg via RESPIRATORY_TRACT
  Filled 2015-03-16 (×9): qty 3

## 2015-03-16 MED ORDER — PYRIDOSTIGMINE BROMIDE 10 MG/2ML IV SOLN
2.0000 mg | Freq: Four times a day (QID) | INTRAVENOUS | Status: DC
  Administered 2015-03-16 – 2015-03-19 (×14): 2 mg via INTRAVENOUS
  Filled 2015-03-16 (×19): qty 0.4

## 2015-03-16 MED ORDER — HEPARIN SODIUM (PORCINE) 1000 UNIT/ML IJ SOLN
1000.0000 [IU] | INTRAMUSCULAR | Status: DC | PRN
  Administered 2015-03-16 (×2): 1200 [IU] via INTRAVENOUS
  Filled 2015-03-16 (×4): qty 1

## 2015-03-16 MED ORDER — FENTANYL CITRATE (PF) 100 MCG/2ML IJ SOLN
50.0000 ug | Freq: Once | INTRAMUSCULAR | Status: DC
  Filled 2015-03-16: qty 2

## 2015-03-16 MED ORDER — SODIUM CHLORIDE 0.9 % IV SOLN
1.5000 g | Freq: Two times a day (BID) | INTRAVENOUS | Status: AC
  Administered 2015-03-16 – 2015-03-17 (×4): 1.5 g via INTRAVENOUS
  Filled 2015-03-16 (×4): qty 1.5

## 2015-03-16 MED ORDER — ANTISEPTIC ORAL RINSE SOLUTION (CORINZ)
7.0000 mL | Freq: Four times a day (QID) | OROMUCOSAL | Status: DC
  Administered 2015-03-16 – 2015-03-28 (×49): 7 mL via OROMUCOSAL

## 2015-03-16 MED ORDER — SODIUM CHLORIDE 0.9 % IV SOLN
25.0000 ug/h | INTRAVENOUS | Status: DC
  Administered 2015-03-16 – 2015-03-18 (×3): 50 ug/h via INTRAVENOUS
  Administered 2015-03-20: 2.5 ug/h via INTRAVENOUS
  Administered 2015-03-20 (×2): 75 ug/h via INTRAVENOUS
  Administered 2015-03-20: 50 ug/h via INTRAVENOUS
  Administered 2015-03-20: 25 ug/h via INTRAVENOUS
  Administered 2015-03-21: 100 ug/h via INTRAVENOUS
  Administered 2015-03-21: 200 ug/h via INTRAVENOUS
  Administered 2015-03-22: 100 ug/h via INTRAVENOUS
  Filled 2015-03-16 (×6): qty 50

## 2015-03-16 MED ORDER — METHYLPREDNISOLONE SODIUM SUCC 40 MG IJ SOLR: 40.0000 mg | Freq: Every day | INTRAMUSCULAR | Status: DC

## 2015-03-16 MED ORDER — MIDAZOLAM HCL 2 MG/2ML IJ SOLN
2.0000 mg | Freq: Once | INTRAMUSCULAR | Status: AC
  Administered 2015-03-16: 2 mg via INTRAVENOUS

## 2015-03-16 MED ORDER — MIDAZOLAM HCL 2 MG/2ML IJ SOLN
INTRAMUSCULAR | Status: AC
  Filled 2015-03-16: qty 2

## 2015-03-16 MED ORDER — FENTANYL BOLUS VIA INFUSION
25.0000 ug | INTRAVENOUS | Status: DC | PRN
  Administered 2015-03-23: 25 ug via INTRAVENOUS
  Filled 2015-03-16: qty 25

## 2015-03-16 MED ORDER — INSULIN ASPART 100 UNIT/ML ~~LOC~~ SOLN: 0.0000 [IU] | SUBCUTANEOUS | Status: DC

## 2015-03-16 MED ORDER — SODIUM CHLORIDE 0.9 % IV BOLUS (SEPSIS)
500.0000 mL | Freq: Once | INTRAVENOUS | Status: AC
  Administered 2015-03-16: 500 mL via INTRAVENOUS

## 2015-03-16 MED ORDER — HYDRALAZINE HCL 20 MG/ML IJ SOLN
10.0000 mg | INTRAMUSCULAR | Status: DC | PRN
  Administered 2015-03-26: 20 mg via INTRAVENOUS
  Administered 2015-03-27: 10 mg via INTRAVENOUS
  Administered 2015-03-29 – 2015-04-07 (×10): 20 mg via INTRAVENOUS
  Filled 2015-03-16 (×2): qty 1
  Filled 2015-03-16: qty 2
  Filled 2015-03-16 (×8): qty 1

## 2015-03-16 NOTE — Progress Notes (Signed)
eLink Physician-Brief Progress Note Patient Name: Cheyenne Boone DOB: 1923/12/22 MRN: [id number]   Date of Service  03/16/2015  HPI/Events of Note    eICU Interventions  Bolus 250 /h x 2h     Intervention Category Intermediate Interventions: Oliguria - evaluation and management  Amandajo Gonder V. 03/16/2015, 3:16 AM

## 2015-03-16 NOTE — Procedures (Signed)
Hemodialysis Catheter Insertion Procedure Note Cheyenne Boone [id number] 10/03/1923  Procedure: Insertion of Hemodialysis Catheter Indications: Plasma Exchange  Procedure Details Consent: Unable to obtain consent because of emergent medical necessity. Time Out: Verified patient identification, verified procedure, site/side was marked, verified correct patient position, special equipment/implants available, medications/allergies/relevent history reviewed, required imaging and test results available.  Performed  Maximum sterile technique was used including antiseptics, cap, gloves, gown, hand hygiene, mask and sheet. Skin prep: Chlorhexidine; local anesthetic administered A antimicrobial bonded/coated triple lumen catheter was placed in the right internal jugular vein using the Seldinger technique.  Evaluation Blood flow good Complications: No apparent complications Patient did tolerate procedure well. Chest X-ray ordered to verify placement.  CXR: pending.  Procedure performed under direct ultrasound guidance for real time vessel cannulation.      Cheyenne Boone, [place] [facility] Pgr: [phone]  or [phone] 03/16/2015, 1:52 PM   [place] needed Consented pt  Cheyenne Boone. Cheyenne Alias, MD, FACP Pgr: [phone] [facility]

## 2015-03-16 NOTE — Consult Note (Addendum)
NEURO HOSPITALIST CONSULT NOTE   Referring physician: Dr Titus Mould Reason for Consult: Probable MG crisis with respiratory failure  HPI:                                                                                                                                          Cheyenne Boone is an 81 y.o. female with a past medical history significant for HTN, hypercholesterolemia, and stroke, transferred to [facility] for further management of likely MG crisis with respiratory failure. Patient is intubated on the vent and thus all history was obtained from patient medical record " 81 year old female with 10 day history of dysphagia. Was admitted to [facility] for what was suspected to be myasthenia gravis flare. She was started on on IVIG and mestinon, however eventually required intubation. Transferred to [facility] at family request and for possible PLEX". Additionally, " She was admitted to [facility] 9/27 with c/o dysphagia x 10 days. She uses walker and lives alone, but next door to her daughter who helps take care of her. She had reportedly not been eating or drinking well due to difficulty swallowing. She would reportedly attempt to swallow, however, food would get stuck in her throat, and then come right back up. In ED she was also noted to have trouble getting words out. Daughter also said that 2 days PTA she developed blurry vision and left eye drooping. She was admitted to the hospitalist team and evaluated by the gastroenterology team and neurology. GI was planning for EGD, however, once Neurology diagnosed probably myesthesia, GI decided to wait. Mestinon, prednisone were started. NGT was placed for nutrition. 9/47 course complicated by respiratory failure requiring ICU admission and emergent intubation. 9/30 she was extubated. 10/1 NGT was pulled out by patient, and was unable to be replaced, likely due to large hiatal hernia. Also started on IVIG. Copious oral secretions post extubation,  started on broad ABX for presumed aspiration PNA. 10/3 required re-intubation for overall weakness. She was transferred to [facility] at family request and for possible PLEX". Presently, awake and following commands. Had IVIG x 1 dose. On mestinon IV 2 mg q 6 h. VC 588, NIF -6. Pt is HOH and was provided a lot of coaching. Pt able to perform VC well but unsure NIF is real.  Past Medical History  Diagnosis Date  . Hypertension   . Hypercholesteremia   . Stroke [name])     Past Surgical History  Procedure Laterality Date  . Tonsillectomy    . Femur im nail Left 11/01/2014    Procedure: INTRAMEDULLARY (IM) NAIL FEMORAL;  Surgeon: Hessie Knows, MD;  Location: [facility] ORS;  Service: Orthopedics;  Laterality: Left;    Family History  Problem Relation Age of Onset  . Arthritis Mother   .  Arthritis Father     Family History: unable to obtain due to mental status   Social History:  reports that she has never smoked. She does not have any smokeless tobacco history on file. She reports that she does not drink alcohol or use illicit drugs.  No Known Allergies  MEDICATIONS:                                                                                                                     Scheduled: . albuterol  2.5 mg Nebulization Q6H  . ampicillin-sulbactam (UNASYN) IV  1.5 g Intravenous Q12H  . antiseptic oral rinse  7 mL Mouth Rinse QID  . chlorhexidine gluconate  15 mL Mouth Rinse BID  . fentaNYL (SUBLIMAZE) injection  50 mcg Intravenous Once  . heparin  5,000 Units Subcutaneous 3 times per day  . insulin aspart  0-9 Units Subcutaneous 6 times per day  . pantoprazole (PROTONIX) IV  40 mg Intravenous QHS  . pyridostigmine  2 mg Intravenous 4 times per day     ROS: unable to obtain due to mental status, intubation                                                                                                                                     History obtained from chart review  Physical  exam:  Constitutional: elderly female, intubated on the vent, in no apparent distress. Blood pressure 122/50, pulse 100, temperature 99.4 F (37.4 C), temperature source Oral, resp. rate 13, height 5' 2"  (1.575 m), weight 49.1 kg (108 lb 3.9 oz), SpO2 98 %. Eyes: no jaundice or exophthalmos.  Head: normocephalic. Neck: supple, no bruits, no JVD. Cardiac: no murmurs. Lungs: clear. Abdomen: soft, no tender, no mass. Extremities: no edema, clubbing, or cyanosis.  Skin: no rash  Neurologic Examination:                                                                                                      General:  NAD Mental Status: Intubated on the vent but alert and awake and following commands.  Cranial Nerves: II: Discs flat bilaterally; Visual fields grossly normal, pupils equal, round, reactive to light and accommodation III,IV, VI: mild left ptosis, extra-ocular motions intact bilaterally V,VII: smile symmetric, facial light touch sensation normal bilaterally VIII: hearing normal bilaterally IX,X: intubated XI: bilateral shoulder shrug no tested XII: intubated  Motor: Moves all limbs symmetrically Sensory: Pinprick and light touch intact throughout, bilaterally Deep Tendon Reflexes:  1+ all over Plantars: Right: downgoing   Left: downgoing Cerebellar: No tested Gait:  Unable to test due to multiple leads    Lab Results  Component Value Date/Time   CHOL 131 03/10/2015 08:35 PM   CHOL 154 12/17/2013 03:26 PM    Results for orders placed or performed during the hospital encounter of 03/15/15 (from the past 48 hour(s))  Comprehensive metabolic panel     Status: Abnormal   Collection Time: 03/15/15 10:45 PM  Result Value Ref Range   Sodium 135 135 - 145 mmol/L   Potassium 3.9 3.5 - 5.1 mmol/L   Chloride 100 (L) 101 - 111 mmol/L   CO2 27 22 - 32 mmol/L   Glucose, Bld 170 (H) 65 - 99 mg/dL   BUN 15 6 - 20 mg/dL   Creatinine, Ser 1.02 (H) 0.44 - 1.00 mg/dL   Calcium 8.7  (L) 8.9 - 10.3 mg/dL   Total Protein 6.7 6.5 - 8.1 g/dL   Albumin 2.7 (L) 3.5 - 5.0 g/dL   AST 41 15 - 41 U/L   ALT 26 14 - 54 U/L   Alkaline Phosphatase 94 38 - 126 U/L   Total Bilirubin 1.3 (H) 0.3 - 1.2 mg/dL   GFR calc non Af Amer 48 (L) >60 mL/min   GFR calc Af Amer 55 (L) >60 mL/min    Comment: (NOTE) The eGFR has been calculated using the CKD EPI equation. This calculation has not been validated in all clinical situations. eGFR's persistently <60 mL/min signify possible Chronic Kidney Disease.    Anion gap 8 5 - 15  Magnesium     Status: None   Collection Time: 03/15/15 10:45 PM  Result Value Ref Range   Magnesium 1.7 1.7 - 2.4 mg/dL  Phosphorus     Status: Abnormal   Collection Time: 03/15/15 10:45 PM  Result Value Ref Range   Phosphorus 7.2 (H) 2.5 - 4.6 mg/dL  Amylase     Status: None   Collection Time: 03/15/15 10:45 PM  Result Value Ref Range   Amylase 92 28 - 100 U/L  Lipase, blood     Status: Abnormal   Collection Time: 03/15/15 10:45 PM  Result Value Ref Range   Lipase 64 (H) 22 - 51 U/L  Troponin I     Status: None   Collection Time: 03/15/15 10:45 PM  Result Value Ref Range   Troponin I <0.03 <0.031 ng/mL    Comment:        NO INDICATION OF MYOCARDIAL INJURY.   Lactic acid, plasma     Status: None   Collection Time: 03/15/15 10:45 PM  Result Value Ref Range   Lactic Acid, Venous 1.4 0.5 - 2.0 mmol/L  Procalcitonin     Status: None   Collection Time: 03/15/15 10:45 PM  Result Value Ref Range   Procalcitonin 0.38 ng/mL    Comment:        Interpretation: PCT (Procalcitonin) <= 0.5 ng/mL: Systemic infection (sepsis) is not likely. Local bacterial  infection is possible. (NOTE)         ICU PCT Algorithm               Non ICU PCT Algorithm    ----------------------------     ------------------------------         PCT < 0.25 ng/mL                 PCT < 0.1 ng/mL     Stopping of antibiotics            Stopping of antibiotics       strongly encouraged.                strongly encouraged.    ----------------------------     ------------------------------       PCT level decrease by               PCT < 0.25 ng/mL       >= 80% from peak PCT       OR PCT 0.25 - 0.5 ng/mL          Stopping of antibiotics                                             encouraged.     Stopping of antibiotics           encouraged.    ----------------------------     ------------------------------       PCT level decrease by              PCT >= 0.25 ng/mL       < 80% from peak PCT        AND PCT >= 0.5 ng/mL            Continuin g antibiotics                                              encouraged.       Continuing antibiotics            encouraged.    ----------------------------     ------------------------------     PCT level increase compared          PCT > 0.5 ng/mL         with peak PCT AND          PCT >= 0.5 ng/mL             Escalation of antibiotics                                          strongly encouraged.      Escalation of antibiotics        strongly encouraged.   CBC WITH DIFFERENTIAL     Status: Abnormal   Collection Time: 03/15/15 10:45 PM  Result Value Ref Range   WBC 11.3 (H) 4.0 - 10.5 K/uL   RBC 4.21 3.87 - 5.11 MIL/uL   Hemoglobin 13.0 12.0 - 15.0 g/dL   HCT 38.3 36.0 - 46.0 %   MCV 91.0 78.0 - 100.0 fL   MCH 30.9 26.0 - 34.0 pg   MCHC 33.9  30.0 - 36.0 g/dL   RDW 14.7 11.5 - 15.5 %   Platelets 275 150 - 400 K/uL   Neutrophils Relative % 82 %   Neutro Abs 9.2 (H) 1.7 - 7.7 K/uL   Lymphocytes Relative 7 %   Lymphs Abs 0.7 0.7 - 4.0 K/uL   Monocytes Relative 10 %   Monocytes Absolute 1.2 (H) 0.1 - 1.0 K/uL   Eosinophils Relative 1 %   Eosinophils Absolute 0.1 0.0 - 0.7 K/uL   Basophils Relative 0 %   Basophils Absolute 0.0 0.0 - 0.1 K/uL  Protime-INR     Status: None   Collection Time: 03/15/15 10:45 PM  Result Value Ref Range   Prothrombin Time 13.6 11.6 - 15.2 seconds   INR 1.02 0.00 - 1.49  APTT     Status: None    Collection Time: 03/15/15 10:45 PM  Result Value Ref Range   aPTT 30 24 - 37 seconds  Brain natriuretic peptide     Status: Abnormal   Collection Time: 03/15/15 10:46 PM  Result Value Ref Range   B Natriuretic Peptide 314.2 (H) 0.0 - 100.0 pg/mL  Cortisol     Status: None   Collection Time: 03/15/15 10:46 PM  Result Value Ref Range   Cortisol, Plasma 30.0 ug/dL    Comment: (NOTE) AM    6.7 - 22.6 ug/dL PM   <10.0       ug/dL   Glucose, capillary     Status: Abnormal   Collection Time: 03/15/15 10:48 PM  Result Value Ref Range   Glucose-Capillary 141 (H) 65 - 99 mg/dL  MRSA PCR Screening     Status: None   Collection Time: 03/15/15 10:56 PM  Result Value Ref Range   MRSA by PCR NEGATIVE NEGATIVE    Comment:        The GeneXpert MRSA Assay (FDA approved for NASAL specimens only), is one component of a comprehensive MRSA colonization surveillance program. It is not intended to diagnose MRSA infection nor to guide or monitor treatment for MRSA infections.   Urinalysis, Routine w reflex microscopic (not at [facility])     Status: Abnormal   Collection Time: 03/15/15 10:57 PM  Result Value Ref Range   Color, Urine YELLOW YELLOW   APPearance CLEAR CLEAR   Specific Gravity, Urine 1.025 1.005 - 1.030   pH 5.5 5.0 - 8.0   Glucose, UA NEGATIVE NEGATIVE mg/dL   Hgb urine dipstick NEGATIVE NEGATIVE   Bilirubin Urine SMALL (A) NEGATIVE   Ketones, ur 15 (A) NEGATIVE mg/dL   Protein, ur 30 (A) NEGATIVE mg/dL   Urobilinogen, UA 0.2 0.0 - 1.0 mg/dL   Nitrite NEGATIVE NEGATIVE   Leukocytes, UA NEGATIVE NEGATIVE  Urine microscopic-add on     Status: Abnormal   Collection Time: 03/15/15 10:57 PM  Result Value Ref Range   Squamous Epithelial / LPF RARE RARE   WBC, UA 0-2 <3 WBC/hpf   Bacteria, UA RARE RARE   Casts GRANULAR CAST (A) NEGATIVE  Carboxyhemoglobin     Status: None   Collection Time: 03/15/15 11:49 PM  Result Value Ref Range   Total hemoglobin 13.1 12.0 - 16.0 g/dL   O2  Saturation 78.0 %   Carboxyhemoglobin 1.1 0.5 - 1.5 %   Methemoglobin 0.7 0.0 - 1.5 %  I-STAT 3, arterial blood gas (G3+)     Status: Abnormal   Collection Time: 03/16/15  3:21 AM  Result Value Ref Range   pH, Arterial 7.449 7.350 -  7.450   pCO2 arterial 36.3 35.0 - 45.0 mmHg   pO2, Arterial 107.0 (H) 80.0 - 100.0 mmHg   Bicarbonate 25.2 (H) 20.0 - 24.0 mEq/L   TCO2 26 0 - 100 mmol/L   O2 Saturation 98.0 %   Acid-Base Excess 1.0 0.0 - 2.0 mmol/L   Patient temperature HIDE    Collection site RADIAL, ALLEN'S TEST ACCEPTABLE    Drawn by Operator    Sample type ARTERIAL   Glucose, capillary     Status: Abnormal   Collection Time: 03/16/15  4:05 AM  Result Value Ref Range   Glucose-Capillary 111 (H) 65 - 99 mg/dL  Troponin I     Status: None   Collection Time: 03/16/15  4:44 AM  Result Value Ref Range   Troponin I <0.03 <0.031 ng/mL    Comment:        NO INDICATION OF MYOCARDIAL INJURY.   CBC     Status: Abnormal   Collection Time: 03/16/15  4:44 AM  Result Value Ref Range   WBC 9.1 4.0 - 10.5 K/uL   RBC 3.55 (L) 3.87 - 5.11 MIL/uL   Hemoglobin 10.9 (L) 12.0 - 15.0 g/dL   HCT 32.4 (L) 36.0 - 46.0 %   MCV 91.3 78.0 - 100.0 fL   MCH 30.7 26.0 - 34.0 pg   MCHC 33.6 30.0 - 36.0 g/dL   RDW 14.9 11.5 - 15.5 %   Platelets 224 150 - 400 K/uL  Basic metabolic panel     Status: Abnormal   Collection Time: 03/16/15  4:44 AM  Result Value Ref Range   Sodium 136 135 - 145 mmol/L   Potassium 3.7 3.5 - 5.1 mmol/L   Chloride 103 101 - 111 mmol/L   CO2 25 22 - 32 mmol/L   Glucose, Bld 106 (H) 65 - 99 mg/dL   BUN 15 6 - 20 mg/dL   Creatinine, Ser 1.10 (H) 0.44 - 1.00 mg/dL   Calcium 8.0 (L) 8.9 - 10.3 mg/dL   GFR calc non Af Amer 44 (L) >60 mL/min   GFR calc Af Amer 50 (L) >60 mL/min    Comment: (NOTE) The eGFR has been calculated using the CKD EPI equation. This calculation has not been validated in all clinical situations. eGFR's persistently <60 mL/min signify possible Chronic  Kidney Disease.    Anion gap 8 5 - 15  Magnesium     Status: Abnormal   Collection Time: 03/16/15  4:44 AM  Result Value Ref Range   Magnesium 1.5 (L) 1.7 - 2.4 mg/dL  Phosphorus     Status: Abnormal   Collection Time: 03/16/15  4:44 AM  Result Value Ref Range   Phosphorus 5.2 (H) 2.5 - 4.6 mg/dL    Dg Chest 1 View  03/15/2015   CLINICAL DATA:  Shortness of breath.  EXAM: CHEST 1 VIEW  COMPARISON:  03/13/2015.  FINDINGS: Mediastinum is stable. Cut off of the right mainstem bronchus is noted. This may be from mucous plugging. Associated atelectasis is present in the right lower lobe. Left lower lobe infiltrate with small left pleural effusion. No pneumothorax. Cardiomegaly. No acute bony abnormality. Surgical clips in the neck.  IMPRESSION: 1. Cut off of the right mainstem bronchus suggesting mucous plugging. Associated right lower lobe atelectasis is present. 2. Left lower lobe infiltrate with small left pleural effusion noted. 3. Stable cardiomegaly.  No pulmonary venous congestion.   Electronically Signed   By: Marcello Moores  Register   On: 03/15/2015 07:55  Dg Fluoro Rm 1-60 Min  03/15/2015   CLINICAL DATA:  Feeding tube placement.  EXAM: FLOURO RM 1-60 MIN  CONTRAST:  None.  FLUOROSCOPY TIME:  Entrance dose 125.5 mGy  Scratched  COMPARISON:  03/15/2015, 03/11/2015 scratched  FINDINGS: Multiple attempts were made to pass a Dobhoff tube below the left hemidiaphragm. Prominent hiatal hernia is present. The Dobhoff tube could not be passed below the left hemi diaphragm after innumerable attempts. Tube was pulled and exam terminated. Patient left in stable condition.  IMPRESSION: Inability to place double tube tip below left hemidiaphragm due to large hiatal hernia.   Electronically Signed   By: Marcello Moores  Register   On: 03/15/2015 10:37   Dg Chest Port 1 View  03/16/2015   CLINICAL DATA:  Respiratory failure, aspiration pneumonia, acute renal failure  EXAM: PORTABLE CHEST 1 VIEW  COMPARISON:  Portable  chest x-ray of March 15, 2015  FINDINGS: The patient is markedly rotated once again on this study. The lungs are well-expanded. There is increased density at both lung bases compatible with atelectasis and small pleural effusion. The cardiac silhouette is top-normal in size. There is tortuosity of the descending thoracic aorta.  The endotracheal tube tip lies approximately 2 cm above the carina. The left internal jugular venous catheter tip projects over the midportion of the SVC.  IMPRESSION: Stable appearance of the chest allowing for rotation. Bibasilar atelectasis or infiltrate and small bilateral pleural effusions are present. The support tubes are in reasonable position.   Electronically Signed   By: David  Martinique M.D.   On: 03/16/2015 07:15   Dg Chest Port 1 View  03/15/2015   CLINICAL DATA:  Endotracheal tube placement  EXAM: PORTABLE CHEST 1 VIEW  COMPARISON:  1750 hours  FINDINGS: Endotracheal tube and left jugular central venous catheter are stable. NG tube has been removed. Right pleural effusion and basilar opacity are stable. Findings remain worrisome for right lower lobe collapse. Hazy airspace disease at the left base have worsened.  IMPRESSION: NG tube removed.  Worsening airspace disease at the left base.  Otherwise stable.   Electronically Signed   By: Marybelle Killings M.D.   On: 03/15/2015 23:17   Dg Chest Port 1 View  03/15/2015   CLINICAL DATA:  Left IJ central line placement.  Initial encounter.  EXAM: PORTABLE CHEST 1 VIEW  COMPARISON:  Earlier the same date.  CT 03/09/2015.  FINDINGS: 1751 hours. There is persistent partial right lung collapse with mediastinal shift to the right. Tip of the endotracheal tube is unchanged, approximately 2 cm above the carina. Nasogastric tube tip remains in the mid esophagus (patient has a hiatal hernia on prior CT). There is a new left IJ central venous catheter, extending to the level of the SVC right atrial junction allowing for the mediastinal shift.  No evidence of pneumothorax. The left lung is clear.  IMPRESSION: 1. Left IJ central venous catheter placement as described, tip at the SVC right atrial junction level. No evidence of pneumothorax. 2. Nasogastric tube tip remains at the mid esophageal level, possibly in the proximal aspect of a hiatal hernia. 3. Persistent partial right lung collapse with mediastinal shift.   Electronically Signed   By: Richardean Sale M.D.   On: 03/15/2015 18:20   Dg Chest Port 1 View  03/15/2015   CLINICAL DATA:  Status post intubation  EXAM: PORTABLE CHEST - 1 VIEW  COMPARISON:  03/15/2015  FINDINGS: Endotracheal tube is now seen approximately 2.1 cm above the carina.  A nasogastric catheter is noted in the midesophagus. This could be advanced several cm into the stomach. Patient is rotated to the right accentuating the mediastinal markings in tortuosity of the thoracic aorta. Cardiac shadow is stable. Stable right perihilar and right basilar consolidation.  IMPRESSION: Endotracheal tube 2.1 cm above the carina.  The remainder the exam is stable. Nasogastric catheter is noted in the midesophagus.   Electronically Signed   By: Inez Catalina M.D.   On: 03/15/2015 14:13   Assessment/Plan: 81 y/o transferred to [facility] for further management of likely MG crisis with respiratory failure. Patient is intubated on the vent but alert, awake and able to follow commands. Had IVIG x 1 dose. Acetylcholine receptor antibodies pending.  VC 588, NIF -6. Pt is HOH and was provided a lot of coaching. Pt able to perform VC well but unsure NIF is real. Recommend: 1) D/C IVIG 2) PLEX, 5 days. 3) Continue IV mestinon. 4) Check VC, NIF daily Will follow up.   Dorian Pod, MD 03/16/2015, 8:32 AM

## 2015-03-16 NOTE — Progress Notes (Signed)
ANTIBIOTIC CONSULT NOTE - INITIAL  Pharmacy Consult for Unasyn  Indication: Aspiration PNA  No Known Allergies  Patient Measurements: Height:  (157.5 cm) Weight: 108 lb 3.9 oz (49.1 kg) IBW/kg (Calculated) : 50.1   Vital Signs: Temp: 96.5 F (35.8 C) (10/04 0030) Temp Source: Rectal (10/04 0030) BP: 99/60 mmHg (10/04 0316) Pulse Rate: 135 (10/04 0316) Intake/Output from previous day: 10/03 0701 - 10/04 0700 In: 315 [I.V.:315] Out: 140 [Urine:140] Intake/Output from this shift: Total I/O In: 315 [I.V.:315] Out: 140 [Urine:140]  Labs:  Recent Labs  03/13/15 0422  03/15/15 0611 03/15/15 1437 03/15/15 2245  WBC 11.9*  --  9.6  --  11.3*  HGB 12.2  --  13.7  --  13.0  PLT 190  --  255  --  275  CREATININE 1.11*  < > 0.90 0.94 1.02*  < > = values in this interval not displayed. Estimated Creatinine Clearance: 29.6 mL/min (by C-G formula based on Cr of 1.02).  Recent Labs  03/14/15 0127  VANCOTROUGH 13     Microbiology: Recent Results (from the past 720 hour(s))  Culture, blood (routine x 2)     Status: None (Preliminary result)   Collection Time: 03/11/15  1:23 PM  Result Value Ref Range Status   Specimen Description BLOOD RIGHT ARM  Final   Special Requests BOTTLES DRAWN AEROBIC AND ANAEROBIC 2CC  Final   Culture NO GROWTH 4 DAYS  Final   Report Status PENDING  Incomplete  Culture, blood (routine x 2)     Status: None (Preliminary result)   Collection Time: 03/11/15  1:23 PM  Result Value Ref Range Status   Specimen Description BLOOD LEFT ASSIST CONTROL  Final   Special Requests   Final    BOTTLES DRAWN AEROBIC AND ANAEROBIC 4CC ANAERO 7CC AERO   Culture NO GROWTH 4 DAYS  Final   Report Status PENDING  Incomplete  MRSA PCR Screening     Status: None   Collection Time: 03/11/15  2:00 PM  Result Value Ref Range Status   MRSA by PCR  NEGATIVE Final    RESULTS INVALID. NOTIFIED JAMIE SMITH GREGORY AT 1730 FOR RECOLLECT MSS  Culture, bal-quantitative      Status: None   Collection Time: 03/11/15  6:45 PM  Result Value Ref Range Status   Specimen Description BRONCHIAL ALVEOLAR LAVAGE  Final   Special Requests Normal  Final   Gram Stain   Final    FEW WBC SEEN MODERATE GRAM POSITIVE COCCI FEW GRAM NEGATIVE RODS GOOD SPECIMEN - 80-90% WBCS    Culture LIGHT GROWTH HAEMOPHILUS PARAHAEMOLYTICUS  Final   Report Status 03/15/2015 FINAL  Final  MRSA PCR Screening     Status: None   Collection Time: 03/15/15 10:56 PM  Result Value Ref Range Status   MRSA by PCR NEGATIVE NEGATIVE Final    Comment:        The GeneXpert MRSA Assay (FDA approved for NASAL specimens only), is one component of a comprehensive MRSA colonization surveillance program. It is not intended to diagnose MRSA infection nor to guide or monitor treatment for MRSA infections.     Medical History: Past Medical History  Diagnosis Date  . Hypertension   . Hypercholesteremia   . Stroke [facility])    Assessment: 81 y/o tx from [facility], has been on Vanco/Zosyn at [facility], starting Unasyn for aspiration PNA, WBC 11.3, CrCl ~25-30, other labs as above.   Plan:  -Unasyn 1.5g IV q12h -F/U infectious  work-up  Abran Duke 03/16/2015,3:55 AM

## 2015-03-16 NOTE — Progress Notes (Signed)
NIF and VC performed per Neuro Services.  VC 588, NIF -6.  Pt is HOH and was provided a lot of coaching.  Pt able to perform VC well.  I am unsure that NIF is completely accurate.  Neuro PA, Onalee Hua, aware and at bedside for coaching of NIF and VC.

## 2015-03-16 NOTE — Progress Notes (Signed)
Urine output has been less than 20 ml/hr, shift output has been less than 250 ml. Dr. Tyson Alias was made aware, no new orders, will continue to monitor

## 2015-03-16 NOTE — Progress Notes (Signed)
NIF less than -20. FVC 275. Pt does not have good effort at this time. RT will attempt again in the AM.

## 2015-03-16 NOTE — Consult Note (Signed)
[facility] CONSULT Reason for consult: placement of feeding tube Referring Physician: CCM  Cheyenne Boone is an 81 y.o. female.  HPI: 81 year old woman who was admitted to [facility] for myasthenia gravis flair. She was having dysphagia and there was probable aspiration requiring intubation. She was subsequently transferred to [facility]. There was an attempt to place nasogastric tube for feeding however, she has a large title hernia in the tube coiled up in the hernia sac and was unable to be placed. G.I. was called to see about placing the tube endoscopically if possible  Past Medical History  Diagnosis Date  . Hypertension   . Hypercholesteremia   . Stroke [facility])     Past Surgical History  Procedure Laterality Date  . Tonsillectomy    . Femur im nail Left 11/01/2014    Procedure: INTRAMEDULLARY (IM) NAIL FEMORAL;  Surgeon: Hessie Knows, MD;  Location: [facility] ORS;  Service: Orthopedics;  Laterality: Left;    Family History  Problem Relation Age of Onset  . Arthritis Mother   . Arthritis Father     Social History:  reports that she has never smoked. She does not have any smokeless tobacco history on file. She reports that she does not drink alcohol or use illicit drugs.  Allergies: No Known Allergies  Medications; Prior to Admission medications   Medication Sig Start Date End Date Taking? Authorizing Provider  alendronate (FOSAMAX) 70 MG tablet Take 70 mg by mouth once a week.  03/08/15 03/07/16 Yes Historical Provider, MD  ALPRAZolam Duanne Moron) 0.25 MG tablet Take 0.25 mg by mouth 2 (two) times daily. 02/17/15  Yes Historical Provider, MD  amLODipine (NORVASC) 2.5 MG tablet Take 2.5 mg by mouth daily. 02/17/15  Yes Historical Provider, MD  docusate sodium (COLACE) 100 MG capsule Take 100 mg by mouth daily as needed for mild constipation.   Yes Historical Provider, MD  fluticasone (FLONASE) 50 MCG/ACT nasal spray Place 2 sprays into both nostrils daily.  02/28/15 02/28/16 Yes Johnn Hai, PA-C  latanoprost (XALATAN) 0.005 % ophthalmic solution Place 1 drop into the left eye at bedtime.   Yes Historical Provider, MD  polyethylene glycol (MIRALAX / GLYCOLAX) packet Take 17 g by mouth daily as needed for mild constipation or moderate constipation. 11/05/14  Yes Vishwanath Hande, MD  simvastatin (ZOCOR) 20 MG tablet Take 20 mg by mouth daily. 02/01/15  Yes Historical Provider, MD  HYDROcodone-acetaminophen (NORCO/VICODIN) 5-325 MG per tablet Take 1-2 tablets by mouth every 6 (six) hours as needed for moderate pain. 11/04/14   Duanne Guess, PA-C   . albuterol  2.5 mg Nebulization Q6H  . ampicillin-sulbactam (UNASYN) IV  1.5 g Intravenous Q12H  . antiseptic oral rinse  7 mL Mouth Rinse QID  . chlorhexidine gluconate  15 mL Mouth Rinse BID  . fentaNYL (SUBLIMAZE) injection  50 mcg Intravenous Once  . heparin  5,000 Units Subcutaneous 3 times per day  . insulin aspart  0-9 Units Subcutaneous 6 times per day  . pantoprazole (PROTONIX) IV  40 mg Intravenous QHS  . pyridostigmine  2 mg Intravenous 4 times per day   PRN Meds sodium chloride, fentaNYL, heparin, hydrALAZINE Results for orders placed or performed during the hospital encounter of 03/15/15 (from the past 48 hour(s))  Comprehensive metabolic panel     Status: Abnormal   Collection Time: 03/15/15 10:45 PM  Result Value Ref Range   Sodium 135 135 - 145 mmol/L   Potassium 3.9 3.5 - 5.1 mmol/L  Chloride 100 (L) 101 - 111 mmol/L   CO2 27 22 - 32 mmol/L   Glucose, Bld 170 (H) 65 - 99 mg/dL   BUN 15 6 - 20 mg/dL   Creatinine, Ser 1.02 (H) 0.44 - 1.00 mg/dL   Calcium 8.7 (L) 8.9 - 10.3 mg/dL   Total Protein 6.7 6.5 - 8.1 g/dL   Albumin 2.7 (L) 3.5 - 5.0 g/dL   AST 41 15 - 41 U/L   ALT 26 14 - 54 U/L   Alkaline Phosphatase 94 38 - 126 U/L   Total Bilirubin 1.3 (H) 0.3 - 1.2 mg/dL   GFR calc non Af Amer 48 (L) >60 mL/min   GFR calc Af Amer 55 (L) >60 mL/min    Comment: (NOTE) The eGFR  has been calculated using the CKD EPI equation. This calculation has not been validated in all clinical situations. eGFR's persistently <60 mL/min signify possible Chronic Kidney Disease.    Anion gap 8 5 - 15  Magnesium     Status: None   Collection Time: 03/15/15 10:45 PM  Result Value Ref Range   Magnesium 1.7 1.7 - 2.4 mg/dL  Phosphorus     Status: Abnormal   Collection Time: 03/15/15 10:45 PM  Result Value Ref Range   Phosphorus 7.2 (H) 2.5 - 4.6 mg/dL  Amylase     Status: None   Collection Time: 03/15/15 10:45 PM  Result Value Ref Range   Amylase 92 28 - 100 U/L  Lipase, blood     Status: Abnormal   Collection Time: 03/15/15 10:45 PM  Result Value Ref Range   Lipase 64 (H) 22 - 51 U/L  Troponin I     Status: None   Collection Time: 03/15/15 10:45 PM  Result Value Ref Range   Troponin I <0.03 <0.031 ng/mL    Comment:        NO INDICATION OF MYOCARDIAL INJURY.   Lactic acid, plasma     Status: None   Collection Time: 03/15/15 10:45 PM  Result Value Ref Range   Lactic Acid, Venous 1.4 0.5 - 2.0 mmol/L  Procalcitonin     Status: None   Collection Time: 03/15/15 10:45 PM  Result Value Ref Range   Procalcitonin 0.38 ng/mL    Comment:        Interpretation: PCT (Procalcitonin) <= 0.5 ng/mL: Systemic infection (sepsis) is not likely. Local bacterial infection is possible. (NOTE)         ICU PCT Algorithm               Non ICU PCT Algorithm    ----------------------------     ------------------------------         PCT < 0.25 ng/mL                 PCT < 0.1 ng/mL     Stopping of antibiotics            Stopping of antibiotics       strongly encouraged.               strongly encouraged.    ----------------------------     ------------------------------       PCT level decrease by               PCT < 0.25 ng/mL       >= 80% from peak PCT       OR PCT 0.25 - 0.5 ng/mL  Stopping of antibiotics                                             encouraged.     Stopping  of antibiotics           encouraged.    ----------------------------     ------------------------------       PCT level decrease by              PCT >= 0.25 ng/mL       < 80% from peak PCT        AND PCT >= 0.5 ng/mL            Continuin g antibiotics                                              encouraged.       Continuing antibiotics            encouraged.    ----------------------------     ------------------------------     PCT level increase compared          PCT > 0.5 ng/mL         with peak PCT AND          PCT >= 0.5 ng/mL             Escalation of antibiotics                                          strongly encouraged.      Escalation of antibiotics        strongly encouraged.   CBC WITH DIFFERENTIAL     Status: Abnormal   Collection Time: 03/15/15 10:45 PM  Result Value Ref Range   WBC 11.3 (H) 4.0 - 10.5 K/uL   RBC 4.21 3.87 - 5.11 MIL/uL   Hemoglobin 13.0 12.0 - 15.0 g/dL   HCT 38.3 36.0 - 46.0 %   MCV 91.0 78.0 - 100.0 fL   MCH 30.9 26.0 - 34.0 pg   MCHC 33.9 30.0 - 36.0 g/dL   RDW 14.7 11.5 - 15.5 %   Platelets 275 150 - 400 K/uL   Neutrophils Relative % 82 %   Neutro Abs 9.2 (H) 1.7 - 7.7 K/uL   Lymphocytes Relative 7 %   Lymphs Abs 0.7 0.7 - 4.0 K/uL   Monocytes Relative 10 %   Monocytes Absolute 1.2 (H) 0.1 - 1.0 K/uL   Eosinophils Relative 1 %   Eosinophils Absolute 0.1 0.0 - 0.7 K/uL   Basophils Relative 0 %   Basophils Absolute 0.0 0.0 - 0.1 K/uL  Protime-INR     Status: None   Collection Time: 03/15/15 10:45 PM  Result Value Ref Range   Prothrombin Time 13.6 11.6 - 15.2 seconds   INR 1.02 0.00 - 1.49  APTT     Status: None   Collection Time: 03/15/15 10:45 PM  Result Value Ref Range   aPTT 30 24 - 37 seconds  Brain natriuretic peptide     Status: Abnormal   Collection Time: 03/15/15 10:46 PM  Result Value Ref Range   B Natriuretic  Peptide 314.2 (H) 0.0 - 100.0 pg/mL  Cortisol     Status: None   Collection Time: 03/15/15 10:46 PM  Result  Value Ref Range   Cortisol, Plasma 30.0 ug/dL    Comment: (NOTE) AM    6.7 - 22.6 ug/dL PM   <10.0       ug/dL   Glucose, capillary     Status: Abnormal   Collection Time: 03/15/15 10:48 PM  Result Value Ref Range   Glucose-Capillary 141 (H) 65 - 99 mg/dL  MRSA PCR Screening     Status: None   Collection Time: 03/15/15 10:56 PM  Result Value Ref Range   MRSA by PCR NEGATIVE NEGATIVE    Comment:        The GeneXpert MRSA Assay (FDA approved for NASAL specimens only), is one component of a comprehensive MRSA colonization surveillance program. It is not intended to diagnose MRSA infection nor to guide or monitor treatment for MRSA infections.   Urinalysis, Routine w reflex microscopic (not at [facility])     Status: Abnormal   Collection Time: 03/15/15 10:57 PM  Result Value Ref Range   Color, Urine YELLOW YELLOW   APPearance CLEAR CLEAR   Specific Gravity, Urine 1.025 1.005 - 1.030   pH 5.5 5.0 - 8.0   Glucose, UA NEGATIVE NEGATIVE mg/dL   Hgb urine dipstick NEGATIVE NEGATIVE   Bilirubin Urine SMALL (A) NEGATIVE   Ketones, ur 15 (A) NEGATIVE mg/dL   Protein, ur 30 (A) NEGATIVE mg/dL   Urobilinogen, UA 0.2 0.0 - 1.0 mg/dL   Nitrite NEGATIVE NEGATIVE   Leukocytes, UA NEGATIVE NEGATIVE  Urine microscopic-add on     Status: Abnormal   Collection Time: 03/15/15 10:57 PM  Result Value Ref Range   Squamous Epithelial / LPF RARE RARE   WBC, UA 0-2 <3 WBC/hpf   Bacteria, UA RARE RARE   Casts GRANULAR CAST (A) NEGATIVE  Carboxyhemoglobin     Status: None   Collection Time: 03/15/15 11:49 PM  Result Value Ref Range   Total hemoglobin 13.1 12.0 - 16.0 g/dL   O2 Saturation 78.0 %   Carboxyhemoglobin 1.1 0.5 - 1.5 %   Methemoglobin 0.7 0.0 - 1.5 %  I-STAT 3, arterial blood gas (G3+)     Status: Abnormal   Collection Time: 03/16/15  3:21 AM  Result Value Ref Range   pH, Arterial 7.449 7.350 - 7.450   pCO2 arterial 36.3 35.0 - 45.0 mmHg   pO2, Arterial 107.0 (H) 80.0 - 100.0 mmHg    Bicarbonate 25.2 (H) 20.0 - 24.0 mEq/L   TCO2 26 0 - 100 mmol/L   O2 Saturation 98.0 %   Acid-Base Excess 1.0 0.0 - 2.0 mmol/L   Patient temperature HIDE    Collection site RADIAL, ALLEN'S TEST ACCEPTABLE    Drawn by Operator    Sample type ARTERIAL   Glucose, capillary     Status: Abnormal   Collection Time: 03/16/15  4:05 AM  Result Value Ref Range   Glucose-Capillary 111 (H) 65 - 99 mg/dL  Troponin I     Status: None   Collection Time: 03/16/15  4:44 AM  Result Value Ref Range   Troponin I <0.03 <0.031 ng/mL    Comment:        NO INDICATION OF MYOCARDIAL INJURY.   CBC     Status: Abnormal   Collection Time: 03/16/15  4:44 AM  Result Value Ref Range   WBC 9.1 4.0 - 10.5 K/uL  RBC 3.55 (L) 3.87 - 5.11 MIL/uL   Hemoglobin 10.9 (L) 12.0 - 15.0 g/dL   HCT 32.4 (L) 36.0 - 46.0 %   MCV 91.3 78.0 - 100.0 fL   MCH 30.7 26.0 - 34.0 pg   MCHC 33.6 30.0 - 36.0 g/dL   RDW 14.9 11.5 - 15.5 %   Platelets 224 150 - 400 K/uL  Basic metabolic panel     Status: Abnormal   Collection Time: 03/16/15  4:44 AM  Result Value Ref Range   Sodium 136 135 - 145 mmol/L   Potassium 3.7 3.5 - 5.1 mmol/L   Chloride 103 101 - 111 mmol/L   CO2 25 22 - 32 mmol/L   Glucose, Bld 106 (H) 65 - 99 mg/dL   BUN 15 6 - 20 mg/dL   Creatinine, Ser 1.10 (H) 0.44 - 1.00 mg/dL   Calcium 8.0 (L) 8.9 - 10.3 mg/dL   GFR calc non Af Amer 44 (L) >60 mL/min   GFR calc Af Amer 50 (L) >60 mL/min    Comment: (NOTE) The eGFR has been calculated using the CKD EPI equation. This calculation has not been validated in all clinical situations. eGFR's persistently <60 mL/min signify possible Chronic Kidney Disease.    Anion gap 8 5 - 15  Magnesium     Status: Abnormal   Collection Time: 03/16/15  4:44 AM  Result Value Ref Range   Magnesium 1.5 (L) 1.7 - 2.4 mg/dL  Phosphorus     Status: Abnormal   Collection Time: 03/16/15  4:44 AM  Result Value Ref Range   Phosphorus 5.2 (H) 2.5 - 4.6 mg/dL  Troponin I      Status: None   Collection Time: 03/16/15 11:05 AM  Result Value Ref Range   Troponin I <0.03 <0.031 ng/mL    Comment:        NO INDICATION OF MYOCARDIAL INJURY.   Glucose, capillary     Status: Abnormal   Collection Time: 03/16/15 12:56 PM  Result Value Ref Range   Glucose-Capillary 113 (H) 65 - 99 mg/dL  Urinalysis, Routine w reflex microscopic (not at [facility])     Status: Abnormal   Collection Time: 03/16/15  1:02 PM  Result Value Ref Range   Color, Urine YELLOW YELLOW   APPearance CLEAR CLEAR   Specific Gravity, Urine 1.021 1.005 - 1.030   pH 5.5 5.0 - 8.0   Glucose, UA NEGATIVE NEGATIVE mg/dL   Hgb urine dipstick NEGATIVE NEGATIVE   Bilirubin Urine SMALL (A) NEGATIVE   Ketones, ur NEGATIVE NEGATIVE mg/dL   Protein, ur 30 (A) NEGATIVE mg/dL   Urobilinogen, UA 1.0 0.0 - 1.0 mg/dL   Nitrite NEGATIVE NEGATIVE   Leukocytes, UA NEGATIVE NEGATIVE  Sodium, urine, random     Status: None   Collection Time: 03/16/15  1:02 PM  Result Value Ref Range   Sodium, Ur 57 mmol/L  Osmolality, urine     Status: None   Collection Time: 03/16/15  1:02 PM  Result Value Ref Range   Osmolality, Ur 549 390 - 1090 mOsm/kg    Comment: Performed at [facility]  Urine microscopic-add on     Status: None   Collection Time: 03/16/15  1:02 PM  Result Value Ref Range   Squamous Epithelial / LPF RARE RARE   WBC, UA 0-2 <3 WBC/hpf   Bacteria, UA RARE RARE   Urine-Other LESS THAN 10 mL OF URINE SUBMITTED     Comment: MICROSCOPIC EXAM PERFORMED ON  UNCONCENTRATED URINE    Dg Chest 1 View  03/15/2015   CLINICAL DATA:  Shortness of breath.  EXAM: CHEST 1 VIEW  COMPARISON:  03/13/2015.  FINDINGS: Mediastinum is stable. Cut off of the right mainstem bronchus is noted. This may be from mucous plugging. Associated atelectasis is present in the right lower lobe. Left lower lobe infiltrate with small left pleural effusion. No pneumothorax. Cardiomegaly. No acute bony abnormality. Surgical clips in the  neck.  IMPRESSION: 1. Cut off of the right mainstem bronchus suggesting mucous plugging. Associated right lower lobe atelectasis is present. 2. Left lower lobe infiltrate with small left pleural effusion noted. 3. Stable cardiomegaly.  No pulmonary venous congestion.   Electronically Signed   By: Marcello Moores  Register   On: 03/15/2015 07:55   Dg Fluoro Rm 1-60 Min  03/15/2015   CLINICAL DATA:  Feeding tube placement.  EXAM: FLOURO RM 1-60 MIN  CONTRAST:  None.  FLUOROSCOPY TIME:  Entrance dose 125.5 mGy  Scratched  COMPARISON:  03/15/2015, 03/11/2015 scratched  FINDINGS: Multiple attempts were made to pass a Dobhoff tube below the left hemidiaphragm. Prominent hiatal hernia is present. The Dobhoff tube could not be passed below the left hemi diaphragm after innumerable attempts. Tube was pulled and exam terminated. Patient left in stable condition.  IMPRESSION: Inability to place double tube tip below left hemidiaphragm due to large hiatal hernia.   Electronically Signed   By: Marcello Moores  Register   On: 03/15/2015 10:37   Dg Chest Port 1 View  03/16/2015   CLINICAL DATA:  Status post central line placement  EXAM: PORTABLE CHEST - 1 VIEW  COMPARISON:  03/16/2015  FINDINGS: An endotracheal tube and left jugular central venous catheter are again seen and stable. A new right jugular central venous catheter is noted in the mid to distal superior vena cava. No pneumothorax is seen. Persistent basilar changes are seen, right greater than left. These are stable from the prior exam. No new focal abnormality is seen.  IMPRESSION: Status post right jugular central line without pneumothorax. The remainder of the exam is stable from the prior study.   Electronically Signed   By: Inez Catalina M.D.   On: 03/16/2015 14:26   Dg Chest Port 1 View  03/16/2015   CLINICAL DATA:  Respiratory failure, aspiration pneumonia, acute renal failure  EXAM: PORTABLE CHEST 1 VIEW  COMPARISON:  Portable chest x-ray of March 15, 2015  FINDINGS: The  patient is markedly rotated once again on this study. The lungs are well-expanded. There is increased density at both lung bases compatible with atelectasis and small pleural effusion. The cardiac silhouette is top-normal in size. There is tortuosity of the descending thoracic aorta.  The endotracheal tube tip lies approximately 2 cm above the carina. The left internal jugular venous catheter tip projects over the midportion of the SVC.  IMPRESSION: Stable appearance of the chest allowing for rotation. Bibasilar atelectasis or infiltrate and small bilateral pleural effusions are present. The support tubes are in reasonable position.   Electronically Signed   By: David  Martinique M.D.   On: 03/16/2015 07:15   Dg Chest Port 1 View  03/15/2015   CLINICAL DATA:  Endotracheal tube placement  EXAM: PORTABLE CHEST 1 VIEW  COMPARISON:  1750 hours  FINDINGS: Endotracheal tube and left jugular central venous catheter are stable. NG tube has been removed. Right pleural effusion and basilar opacity are stable. Findings remain worrisome for right lower lobe collapse. Hazy airspace disease at the left  base have worsened.  IMPRESSION: NG tube removed.  Worsening airspace disease at the left base.  Otherwise stable.   Electronically Signed   By: Marybelle Killings M.D.   On: 03/15/2015 23:17   Dg Chest Port 1 View  03/15/2015   CLINICAL DATA:  Left IJ central line placement.  Initial encounter.  EXAM: PORTABLE CHEST 1 VIEW  COMPARISON:  Earlier the same date.  CT 03/09/2015.  FINDINGS: 1751 hours. There is persistent partial right lung collapse with mediastinal shift to the right. Tip of the endotracheal tube is unchanged, approximately 2 cm above the carina. Nasogastric tube tip remains in the mid esophagus (patient has a hiatal hernia on prior CT). There is a new left IJ central venous catheter, extending to the level of the SVC right atrial junction allowing for the mediastinal shift. No evidence of pneumothorax. The left lung is  clear.  IMPRESSION: 1. Left IJ central venous catheter placement as described, tip at the SVC right atrial junction level. No evidence of pneumothorax. 2. Nasogastric tube tip remains at the mid esophageal level, possibly in the proximal aspect of a hiatal hernia. 3. Persistent partial right lung collapse with mediastinal shift.   Electronically Signed   By: Richardean Sale M.D.   On: 03/15/2015 18:20   Dg Chest Port 1 View  03/15/2015   CLINICAL DATA:  Status post intubation  EXAM: PORTABLE CHEST - 1 VIEW  COMPARISON:  03/15/2015  FINDINGS: Endotracheal tube is now seen approximately 2.1 cm above the carina. A nasogastric catheter is noted in the midesophagus. This could be advanced several cm into the stomach. Patient is rotated to the right accentuating the mediastinal markings in tortuosity of the thoracic aorta. Cardiac shadow is stable. Stable right perihilar and right basilar consolidation.  IMPRESSION: Endotracheal tube 2.1 cm above the carina.  The remainder the exam is stable. Nasogastric catheter is noted in the midesophagus.   Electronically Signed   By: Inez Catalina M.D.   On: 03/15/2015 14:13               Blood pressure 122/51, pulse 82, temperature 98.1 F (36.7 C), temperature source Oral, resp. rate 12, height 5' 2" (1.575 m), weight 49.1 kg (108 lb 3.9 oz), SpO2 100 %.  Physical exam:   General-- frail appearing white female intubated and sedated ENT-- nonicteric Neck-- endotracheal tube in place Heart-- tachycardic Lungs-- ventilator breath sounds Abdomen-- nondistended and soft with no gross surgical scars    Assessment: 1. Nutrition. Patient presented with dysphagia and choose is intubated. Feeding tube cannot be placed radio graphically due to large hiatal hernia. We have been asked to see about placing tube endoscopically. 2. Myasthenia gravis crisis with respiratory failure. Presented as severe dysphagia. Patient is currently intubated and is being followed  and treated by neurology. 3. Possible aspiration pneumonia. She is on antibiotics.  Plan: discussion with granddaughter. We will try to go ahead and endoscopically place feeding tube tomorrow. Have explained granddaughter that this will be a temporary solution and that if she is not able to improve to the point where she is able to take inadequate nutrition PO, they may need to consider placement of PEG tube. We will attempt to do the procedure at the bedside.    JR, L 03/16/2015, 5:38 PM   Pager: [phone] If no answer or after hours call [phone]

## 2015-03-16 NOTE — Progress Notes (Signed)
Initial Nutrition Assessment  DOCUMENTATION CODES:   Severe malnutrition in context of chronic illness  INTERVENTION:    When enteral access obtained, recommend initiate TF via enteral feeding tube with Vital AF 1.2 at 10 ml/h, increase slowly by 10 ml every 12 hours to goal rate of 40 ml/h (960 ml per day) to provide 1152 kcals, 72 gm protein, 779 ml free water daily.  Monitor magnesium, potassium, and phosphorus daily for at least 3 days, MD to replete as needed, as pt is at risk for refeeding syndrome given minimal intake x 10 days and severe PCM.  NUTRITION DIAGNOSIS:   Malnutrition related to chronic illness as evidenced by severe depletion of muscle mass, percent weight loss (22% weight loss within 6 months).  GOAL:   Patient will meet greater than or equal to 90% of their needs  MONITOR:   Vent status, Labs, I & O's  REASON FOR ASSESSMENT:   Malnutrition Screening Tool, Ventilator    ASSESSMENT:   81 year old female with 10 day history of dysphagia. Was admitted to [facility] for what was suspected to be myasthenia gravis flare. She was started on on IVIG and mestinon, however eventually required intubation. Transferred to [facility] at family request and for possible PLEX.  Labs reviewed: phosphorus is elevated. Nutrition-Focused physical exam completed. Findings are mild-moderate fat depletion, severe muscle depletion, and mild edema. Patient with severe PCM. Dysphagia with minimal intake for the past 10 days.   Patient is currently intubated on ventilator support MV: 6.7 L/min Temp (24hrs), Avg:96.5 F (35.8 C), Min:94.6 F (34.8 C), Max:99.4 F (37.4 C)   Diet Order:  Diet NPO time specified  Skin:  Reviewed, no issues  Last BM:  unknown  Height:   Ht Readings from Last 1 Encounters:  03/15/15  (1.575 m)    Weight:   Wt Readings from Last 1 Encounters:  03/15/15 108 lb 3.9 oz (49.1 kg)    Ideal Body Weight:  50 kg  BMI:  Body mass index is 19.79  kg/(m^2).  Estimated Nutritional Needs:   Kcal:  1087  Protein:  65-75 gm  Fluid:  1.5 L  EDUCATION NEEDS:   No education needs identified at this time  Joaquin Courts, RD, LDN, CNSC Pager [phone] After Hours Pager [phone]

## 2015-03-16 NOTE — Progress Notes (Signed)
Patients urine output decrease, Dr. Vassie Loll made aware with orders made. Will give IV bolus and continue to monitor patient.

## 2015-03-16 NOTE — H&P (Signed)
PULMONARY / CRITICAL CARE MEDICINE   Name: TENIA GOH MRN: [id number] DOB: 03-08-1924    ADMISSION DATE:  03/15/2015 CONSULTATION DATE:  03/15/2015  REFERRING MD :  Jamison Neighbor  CHIEF COMPLAINT:  Dysphagia   INITIAL PRESENTATION: 81 year old female with 10 day history of dysphagia. Was admitted to [facility] for what was suspected to be myasthenia gravis flare. She was started on on IVIG and mestinon, however eventually required intubation. Transferred to [facility] at family request and for possible PLEX. PCCM to admit.   STUDIES:  9/27 CT head - Stable noncontrast CT appearance of the brain since May. No acute intracranial abnormality  9/27 Barium swallow - Nondiagnostic barium swallow examination due to the patient's inability to more than tiny amounts of the barium, Possible mass in the AP window. Possible infrahilar pneumonia on the left.  9/27 CT chest - Significant coronary artery disease, mitral annulus and aortic valve disease. Tortuous aorta without aneurysm. Large hiatal hernia. No associated obstruction.  9/27 CT soft tissue neck - Moderate calcified plaque is noted in proximal right internal carotid artery; carotid ultrasound is recommended for further evaluation. Fluid is noted in the right mastoid air cells. Clinical correlation is recommended to rule out mastoiditis.  9/28 MRI brain - No acute intracranial abnormality. Chronic small vessel ischemic disease is stable since 2015. Chronic cervical spine degeneration with C3-C4 and C4-C5 spinal stenosis which does not appear significantly progressed since 2015.  9/28 Carotid doppler - Right carotid bifurcation and proximal ICA plaque, resulting in less than 50% diameter stenosis. The exam does not exclude plaque ulceration or embolization.  SIGNIFICANT EVENTS: 9/27 - Admit for dysphagia  9/29 - Transfer to ICU @ [facility] for respiratory failure & intubated>>Bronchoscopy with bilateral secretions & plugging 10/01 - Extubated 10/03 -  Reintubated 10/03 - Transfer to [facility] from [facility]  SUBJECTIVE: remains weak, secretions are an issue  VITAL SIGNS: Temp:  [94.6 F (34.8 C)-99.4 F (37.4 C)] 99.4 F (37.4 C) (10/04 0830) Pulse Rate:  [72-135] 106 (10/04 1200) Resp:  [6-42] 24 (10/04 1200) BP: (80-172)/(47-119) 127/71 mmHg (10/04 1212) SpO2:  [92 %-100 %] 99 % (10/04 1200) FiO2 (%):  [40 %-75 %] 40 % (10/04 1200) Weight:  [49.1 kg (108 lb 3.9 oz)] 49.1 kg (108 lb 3.9 oz) (10/03 2237) HEMODYNAMICS: CVP:  [2 mmHg-13 mmHg] 11 mmHg VENTILATOR SETTINGS: Vent Mode:  [-] PRVC FiO2 (%):  [40 %-75 %] 40 % Set Rate:  [12 bmp-16 bmp] 12 bmp Vt Set:  [350 mL-400 mL] 400 mL PEEP:  [5 cmH20] 5 cmH20 Pressure Support:  [5 cmH20] 5 cmH20 Plateau Pressure:  [20 cmH20] 20 cmH20 INTAKE / OUTPUT:  Intake/Output Summary (Last 24 hours) at 03/16/15 1244 Last data filed at 03/16/15 1200  Gross per 24 hour  Intake 1985.33 ml  Output    382 ml  Net 1603.33 ml    PHYSICAL EXAMINATION: General:  Elderly female on vent, weak  Neuro:  Alert, follows commands HEENT:  [place]/AT, some small JVD, PERRL Cardiovascular:  s1 s2 RRR Lungs:  Diminished but clear Abdomen:  Soft, non-tender, non-distended Musculoskeletal:  No acute deformity or ROM limitation Skin:  Grossly intact  LABS:  CBC  Recent Labs Lab 03/15/15 0611 03/15/15 2245 03/16/15 0444  WBC 9.6 11.3* 9.1  HGB 13.7 13.0 10.9*  HCT 40.2 38.3 32.4*  PLT 255 275 224   Coag's  Recent Labs Lab 03/10/15 1420 03/15/15 2245  APTT  --  30  INR 1.05 1.02  BMET  Recent Labs Lab 03/15/15 1437 03/15/15 2245 03/16/15 0444  NA 135 135 136  K 3.3* 3.9 3.7  CL 102 100* 103  CO2 25 27 25   BUN 15 15 15   CREATININE 0.94 1.02* 1.10*  GLUCOSE 107* 170* 106*   Electrolytes  Recent Labs Lab 03/15/15 1437 03/15/15 2245 03/16/15 0444  CALCIUM 8.9 8.7* 8.0*  MG 1.8 1.7 1.5*  PHOS 2.4* 7.2* 5.2*   Sepsis Markers  Recent Labs Lab 03/12/15 0520  03/13/15 0422 03/15/15 2245  LATICACIDVEN  --   --  1.4  PROCALCITON 0.47 0.63 0.38   ABG  Recent Labs Lab 03/13/15 0350 03/14/15 0920 03/16/15 0321  PHART 7.43 7.46* 7.449  PCO2ART 35 37 36.3  PO2ART 171* 50* 107.0*   Liver Enzymes  Recent Labs Lab 03/11/15 1615 03/15/15 2245  AST 25 41  ALT 12* 26  ALKPHOS 94 94  BILITOT 1.6* 1.3*  ALBUMIN 3.5 2.7*   Cardiac Enzymes  Recent Labs Lab 03/15/15 2245 03/16/15 0444 03/16/15 1105  TROPONINI <0.03 <0.03 <0.03   Glucose  Recent Labs Lab 03/15/15 0335 03/15/15 0714 03/15/15 1118 03/15/15 1757 03/15/15 2248 03/16/15 0405  GLUCAP 120* 149* 128* 105* 141* 111*    Imaging Dg Chest Port 1 View  03/16/2015   CLINICAL DATA:  Respiratory failure, aspiration pneumonia, acute renal failure  EXAM: PORTABLE CHEST 1 VIEW  COMPARISON:  Portable chest x-ray of March 15, 2015  FINDINGS: The patient is markedly rotated once again on this study. The lungs are well-expanded. There is increased density at both lung bases compatible with atelectasis and small pleural effusion. The cardiac silhouette is top-normal in size. There is tortuosity of the descending thoracic aorta.  The endotracheal tube tip lies approximately 2 cm above the carina. The left internal jugular venous catheter tip projects over the midportion of the SVC.  IMPRESSION: Stable appearance of the chest allowing for rotation. Bibasilar atelectasis or infiltrate and small bilateral pleural effusions are present. The support tubes are in reasonable position.   Electronically Signed   By: David  [place] M.D.   On: 03/16/2015 07:15   Dg Chest Port 1 View  03/15/2015   CLINICAL DATA:  Endotracheal tube placement  EXAM: PORTABLE CHEST 1 VIEW  COMPARISON:  1750 hours  FINDINGS: Endotracheal tube and left jugular central venous catheter are stable. NG tube has been removed. Right pleural effusion and basilar opacity are stable. Findings remain worrisome for right lower lobe  collapse. Hazy airspace disease at the left base have worsened.  IMPRESSION: NG tube removed.  Worsening airspace disease at the left base.  Otherwise stable.   Electronically Signed   By: Jolaine Click M.D.   On: 03/15/2015 23:17   Dg Chest Port 1 View  03/15/2015   CLINICAL DATA:  Left IJ central line placement.  Initial encounter.  EXAM: PORTABLE CHEST 1 VIEW  COMPARISON:  Earlier the same date.  CT 03/09/2015.  FINDINGS: 1751 hours. There is persistent partial right lung collapse with mediastinal shift to the right. Tip of the endotracheal tube is unchanged, approximately 2 cm above the carina. Nasogastric tube tip remains in the mid esophagus (patient has a hiatal hernia on prior CT). There is a new left IJ central venous catheter, extending to the level of the SVC right atrial junction allowing for the mediastinal shift. No evidence of pneumothorax. The left lung is clear.  IMPRESSION: 1. Left IJ central venous catheter placement as described, tip at the SVC right  atrial junction level. No evidence of pneumothorax. 2. Nasogastric tube tip remains at the mid esophageal level, possibly in the proximal aspect of a hiatal hernia. 3. Persistent partial right lung collapse with mediastinal shift.   Electronically Signed   By: Carey Bullocks M.D.   On: 03/15/2015 18:20   Dg Chest Port 1 View  03/15/2015   CLINICAL DATA:  Status post intubation  EXAM: PORTABLE CHEST - 1 VIEW  COMPARISON:  03/15/2015  FINDINGS: Endotracheal tube is now seen approximately 2.1 cm above the carina. A nasogastric catheter is noted in the midesophagus. This could be advanced several cm into the stomach. Patient is rotated to the right accentuating the mediastinal markings in tortuosity of the thoracic aorta. Cardiac shadow is stable. Stable right perihilar and right basilar consolidation.  IMPRESSION: Endotracheal tube 2.1 cm above the carina.  The remainder the exam is stable. Nasogastric catheter is noted in the midesophagus.    Electronically Signed   By: Alcide Clever M.D.   On: 03/15/2015 14:13     ASSESSMENT / PLAN:  PULMONARY OETT 9/29 > 10/1, 10/3 >>> A: Acute respiratory failure in setting myasthenia gravis flare Mucus plugging - S/P bronch & BAL 9/29  P:   Full vent support VAP bundle SBT attempt ok , cpap5 ps5, no extubation planned Will need to check NIF / FVC after therapy Albuterol nebs q6hr Chest PT q4hr May need T V reduction, see last ABG   CARDIOVASCULAR CVL LIJ 10/3 > A:  H/O HTN H/O HLD  P:  Monitor on telemetry PRN hydralazine - not required  RENAL A:   Acute Renal Failure - UOP reported low, hypovoolemia? Hypokalemia - resolved Hyperphosphatemia hypomag P:   Mag supp Chem in am  cvp low Add saline 125 Get urine a, urine na, osm  GASTROINTESTINAL A:   Large hiatal hernia Dysphagia  P:   Protonix IV daily for prophylaxis NPO for now Consider GI consult for OGt placement NO ROLE TPN , WOULD HARM Could consider peg early  HEMATOLOGIC A:  DVT prevention P:  Heparin for VTE prophylaxis SCDs  INFECTIOUS A:   Leukocytosis in setting oral steroids Aspiration PNA - H parahaemolytics on BAL  P:   BCx2 9/29 >>> BAL 9/29 - H parahemolyticus UC - neg  Abx:  Unasyn, start date 10/04>>> Zosyn, start date 9/29 >>>10/04 Vancomycin, start date 9/29 >>>10/04  Add stop date total 7 days  ENDOCRINE A:   Hyperglycemia  P:   Accuchecks q4hr Low dose SSI algorithm  NEUROLOGIC A:   Myasthenia gravis  P:   RASS goal: 0 Fentanyl infusion Dc versed Continue Mestinon  IV q6hr Holding Solu-Medrol Will likely require PLEX, will place line WUA   FAMILY  - Updates: No family at bedside to update. Tried to call daughter  - Inter-disciplinary family meet or Palliative Care meeting due by:  10/6  Ccm time 30 min   [name]. Tyson Alias, MD, FACP Pgr: [phone] [place] [facility] \

## 2015-03-17 ENCOUNTER — Inpatient Hospital Stay (HOSPITAL_COMMUNITY): Payer: Commercial Managed Care - HMO

## 2015-03-17 ENCOUNTER — Encounter (HOSPITAL_COMMUNITY)
Admission: AD | Disposition: A | Discharge status: Hospice | Payer: Commercial Managed Care - HMO | Source: Other Acute Inpatient Hospital | Attending: Internal Medicine

## 2015-03-17 ENCOUNTER — Encounter (HOSPITAL_COMMUNITY): Payer: Self-pay

## 2015-03-17 ENCOUNTER — Encounter (HOSPITAL_COMMUNITY)
Admission: AD | Disposition: A | Discharge status: Hospice | Payer: Self-pay | Source: Other Acute Inpatient Hospital | Attending: Internal Medicine

## 2015-03-17 DIAGNOSIS — J9600 Acute respiratory failure, unspecified whether with hypoxia or hypercapnia: Secondary | ICD-10-CM | POA: Diagnosis present

## 2015-03-17 HISTORY — PX: ESOPHAGOGASTRODUODENOSCOPY: SHX5428

## 2015-03-17 LAB — CBC WITH DIFFERENTIAL/PLATELET
BASOS ABS: 0 10*3/uL (ref 0.0–0.1)
BASOS PCT: 0 %
EOS PCT: 5 %
Eosinophils Absolute: 0.4 10*3/uL (ref 0.0–0.7)
HCT: 31.2 % — ABNORMAL LOW (ref 36.0–46.0)
Hemoglobin: 10.1 g/dL — ABNORMAL LOW (ref 12.0–15.0)
Lymphocytes Relative: 14 %
Lymphs Abs: 0.9 10*3/uL (ref 0.7–4.0)
MCH: 30.2 pg (ref 26.0–34.0)
MCHC: 32.4 g/dL (ref 30.0–36.0)
MCV: 93.4 fL (ref 78.0–100.0)
MONO ABS: 0.9 10*3/uL (ref 0.1–1.0)
Monocytes Relative: 13 %
NEUTROS ABS: 4.6 10*3/uL (ref 1.7–7.7)
Neutrophils Relative %: 68 %
PLATELETS: 201 10*3/uL (ref 150–400)
RBC: 3.34 MIL/uL — AB (ref 3.87–5.11)
RDW: 15 % (ref 11.5–15.5)
WBC: 6.8 10*3/uL (ref 4.0–10.5)

## 2015-03-17 LAB — GLUCOSE, CAPILLARY
GLUCOSE-CAPILLARY: 55 mg/dL — AB (ref 65–99)
GLUCOSE-CAPILLARY: 73 mg/dL (ref 65–99)
GLUCOSE-CAPILLARY: 74 mg/dL (ref 65–99)
GLUCOSE-CAPILLARY: 77 mg/dL (ref 65–99)
GLUCOSE-CAPILLARY: 83 mg/dL (ref 65–99)
Glucose-Capillary: 69 mg/dL (ref 65–99)
Glucose-Capillary: 94 mg/dL (ref 65–99)
Glucose-Capillary: 69 mg/dL (ref 65–99)
Glucose-Capillary: 52 mg/dL — ABNORMAL LOW (ref 65–99)
Glucose-Capillary: 89 mg/dL (ref 65–99)
Glucose-Capillary: >600 mg/dL (ref 65–99)
Glucose-Capillary: 130 mg/dL — ABNORMAL HIGH (ref 65–99)

## 2015-03-17 LAB — COMPREHENSIVE METABOLIC PANEL
ALBUMIN: 2.2 g/dL — AB (ref 3.5–5.0)
ALK PHOS: 75 U/L (ref 38–126)
ALT: 20 U/L (ref 14–54)
ANION GAP: 6 (ref 5–15)
AST: 23 U/L (ref 15–41)
BILIRUBIN TOTAL: 1 mg/dL (ref 0.3–1.2)
BUN: 12 mg/dL (ref 6–20)
CALCIUM: 8 mg/dL — AB (ref 8.9–10.3)
CO2: 23 mmol/L (ref 22–32)
Chloride: 109 mmol/L (ref 101–111)
Creatinine, Ser: 0.99 mg/dL (ref 0.44–1.00)
GFR calc Af Amer: 57 mL/min — ABNORMAL LOW (ref >60)
GFR, EST NON AFRICAN AMERICAN: 49 mL/min — AB (ref >60)
GLUCOSE: 94 mg/dL (ref 65–99)
POTASSIUM: 3.5 mmol/L (ref 3.5–5.1)
Sodium: 138 mmol/L (ref 135–145)
TOTAL PROTEIN: 5.8 g/dL — AB (ref 6.5–8.1)

## 2015-03-17 LAB — CULTURE, BLOOD (ROUTINE X 2)
CULTURE: NO GROWTH
CULTURE: NO GROWTH

## 2015-03-17 SURGERY — EGD (ESOPHAGOGASTRODUODENOSCOPY)
Anesthesia: Moderate Sedation

## 2015-03-17 MED ORDER — ACD FORMULA A 0.73-2.45-2.2 GM/100ML VI SOLN
500.0000 mL | Status: DC
  Filled 2015-03-17 (×2): qty 500

## 2015-03-17 MED ORDER — NOREPINEPHRINE BITARTRATE 1 MG/ML IV SOLN
0.0000 ug/min | INTRAVENOUS | Status: DC
  Administered 2015-03-17: 2 ug/min via INTRAVENOUS
  Filled 2015-03-17: qty 4

## 2015-03-17 MED ORDER — DEXTROSE 50 % IV SOLN
25.0000 mL | Freq: Once | INTRAVENOUS | Status: AC
  Administered 2015-03-17: 25 mL via INTRAVENOUS

## 2015-03-17 MED ORDER — HEPARIN SODIUM (PORCINE) 1000 UNIT/ML IJ SOLN: 1000.0000 [IU] | Freq: Once | INTRAMUSCULAR | Status: DC

## 2015-03-17 MED ORDER — SODIUM CHLORIDE 0.9 % IV SOLN
4.0000 g | Freq: Once | INTRAVENOUS | Status: DC
  Filled 2015-03-17: qty 40

## 2015-03-17 MED ORDER — DEXTROSE 50 % IV SOLN
INTRAVENOUS | Status: AC
  Administered 2015-03-17: 50 mL via INTRAVENOUS
  Filled 2015-03-17: qty 50

## 2015-03-17 MED ORDER — CALCIUM GLUCONATE 10 % IV SOLN
4.0000 g | Freq: Once | INTRAVENOUS | Status: AC
  Administered 2015-03-17: 4 g via INTRAVENOUS
  Filled 2015-03-17 (×2): qty 40

## 2015-03-17 MED ORDER — DEXTROSE 50 % IV SOLN
1.0000 | Freq: Once | INTRAVENOUS | Status: AC
  Administered 2015-03-17 (×2): 50 mL via INTRAVENOUS

## 2015-03-17 MED ORDER — ACD FORMULA A 0.73-2.45-2.2 GM/100ML VI SOLN
500.0000 mL | Status: DC
  Filled 2015-03-17: qty 500

## 2015-03-17 MED ORDER — DIPHENHYDRAMINE HCL 25 MG PO CAPS: 25.0000 mg | ORAL_CAPSULE | Freq: Four times a day (QID) | ORAL | Status: DC | PRN

## 2015-03-17 MED ORDER — DEXTROSE 50 % IV SOLN
INTRAVENOUS | Status: AC
  Filled 2015-03-17: qty 50

## 2015-03-17 MED ORDER — ACD FORMULA A 0.73-2.45-2.2 GM/100ML VI SOLN
Status: AC
  Filled 2015-03-17: qty 500

## 2015-03-17 MED ORDER — ACETAMINOPHEN 325 MG PO TABS: 650.0000 mg | ORAL_TABLET | ORAL | Status: DC | PRN

## 2015-03-17 MED ORDER — SODIUM CHLORIDE 0.9 % IV SOLN: INTRAVENOUS | Status: DC

## 2015-03-17 MED ORDER — MIDAZOLAM HCL 10 MG/2ML IJ SOLN
INTRAMUSCULAR | Status: DC | PRN
  Administered 2015-03-17: 2 mg via INTRAVENOUS

## 2015-03-17 MED ORDER — SODIUM CHLORIDE 0.9 % IV SOLN: Freq: Once | INTRAVENOUS | Status: DC

## 2015-03-17 MED ORDER — DEXTROSE-NACL 5-0.9 % IV SOLN
INTRAVENOUS | Status: DC
  Administered 2015-03-17: 21:00:00 via INTRAVENOUS

## 2015-03-17 MED ORDER — FENTANYL CITRATE (PF) 100 MCG/2ML IJ SOLN
INTRAMUSCULAR | Status: AC
  Filled 2015-03-17: qty 2

## 2015-03-17 MED ORDER — SODIUM CHLORIDE 0.9 % IV SOLN
INTRAVENOUS | Status: AC
  Administered 2015-03-17: 21:00:00 via INTRAVENOUS_CENTRAL
  Filled 2015-03-17 (×3): qty 200

## 2015-03-17 MED ORDER — SODIUM CHLORIDE 0.9 % IV BOLUS (SEPSIS)
1000.0000 mL | Freq: Once | INTRAVENOUS | Status: AC
  Administered 2015-03-17: 1000 mL via INTRAVENOUS

## 2015-03-17 MED ORDER — MIDAZOLAM HCL 5 MG/ML IJ SOLN
INTRAMUSCULAR | Status: AC
  Filled 2015-03-17: qty 1

## 2015-03-17 MED ORDER — CALCIUM GLUCONATE 10 % IV SOLN
2.0000 g | Freq: Once | INTRAVENOUS | Status: DC
  Filled 2015-03-17: qty 20

## 2015-03-17 MED ORDER — CALCIUM CARBONATE ANTACID 500 MG PO CHEW
2.0000 | CHEWABLE_TABLET | ORAL | Status: AC
  Filled 2015-03-17 (×2): qty 2

## 2015-03-17 MED ORDER — DEXTROSE 50 % IV SOLN
1.0000 | Freq: Once | INTRAVENOUS | Status: AC
  Administered 2015-03-17: 50 mL via INTRAVENOUS

## 2015-03-17 NOTE — Progress Notes (Signed)
Pt tolerated EGD procedure well. ICU RN and Endo RN remained with patient for 15 min following procedure. Pt resting comfortably upon departure.

## 2015-03-17 NOTE — Progress Notes (Signed)
NIF less than -20, VC 300 with fair patient effort.

## 2015-03-17 NOTE — Progress Notes (Signed)
PULMONARY / CRITICAL CARE MEDICINE   Name: LAILANY ENOCH MRN: [id number] DOB: [birth year]    ADMISSION DATE:  03/15/2015 CONSULTATION DATE:  03/15/2015  REFERRING MD :  Jamison Neighbor  CHIEF COMPLAINT:  Dysphagia  INITIAL PRESENTATION: 81 year old female with 10 day history of dysphagia. Was admitted to [facility] for what was suspected to be myasthenia gravis flare. She was started on on IVIG and mestinon, however eventually required intubation. Transferred to [facility] at family request and for possible PLEX. PCCM to admit.   STUDIES:  9/27 CT head - Stable noncontrast CT appearance of the brain since May. No acute intracranial abnormality  9/27 Barium swallow - Nondiagnostic barium swallow examination due to the patient's inability to more than tiny amounts of the barium, Possible mass in the AP window. Possible infrahilar pneumonia on the left.  9/27 CT chest - Significant coronary artery disease, mitral annulus and aortic valve disease. Tortuous aorta without aneurysm. Large hiatal hernia. No associated obstruction.  9/27 CT soft tissue neck - Moderate calcified plaque is noted in proximal right internal carotid artery; carotid ultrasound is recommended for further evaluation. Fluid is noted in the right mastoid air cells. Clinical correlation is recommended to rule out mastoiditis.  9/28 MRI brain - No acute intracranial abnormality. Chronic small vessel ischemic disease is stable since 2015. Chronic cervical spine degeneration with C3-C4 and C4-C5 spinal stenosis which does not appear significantly progressed since 2015.  9/28 Carotid doppler - Right carotid bifurcation and proximal ICA plaque, resulting in less than 50% diameter stenosis. The exam does not exclude plaque ulceration or embolization.  SIGNIFICANT EVENTS: 9/27 - Admit for dysphagia  9/29 - Transfer to ICU @ [facility] for respiratory failure & intubated>>Bronchoscopy with bilateral secretions & plugging 10/01 - Extubated 10/03 -  Reintubated 10/03 - Transfer to [facility] from [facility] 10/4- HD cath placed 10/5 PLEX planned, EGD for OGT placement  SUBJECTIVE:  Awake, intubated, follows commands  VITAL SIGNS: Temp:  [98.1 F (36.7 C)-99.4 F (37.4 C)] 98.2 F (36.8 C) (10/05 0345) Pulse Rate:  [63-107] 73 (10/05 0700) Resp:  [11-31] 12 (10/05 0700) BP: (59-172)/(31-119) 144/63 mmHg (10/05 0700) SpO2:  [97 %-100 %] 100 % (10/05 0700) FiO2 (%):  [40 %] 40 % (10/05 0400) Weight:  [117 lb 11.6 oz (53.4 kg)] 117 lb 11.6 oz (53.4 kg) (10/05 0227) HEMODYNAMICS: CVP:  [2 mmHg-13 mmHg] 8 mmHg VENTILATOR SETTINGS: Vent Mode:  [-] PRVC FiO2 (%):  [40 %] 40 % Set Rate:  [12 bmp] 12 bmp Vt Set:  [400 mL] 400 mL PEEP:  [5 cmH20] 5 cmH20 Pressure Support:  [5 cmH20] 5 cmH20 Plateau Pressure:  [14 cmH20-18 cmH20] 16 cmH20 INTAKE / OUTPUT:  Intake/Output Summary (Last 24 hours) at 03/17/15 0743 Last data filed at 03/17/15 0700  Gross per 24 hour  Intake 4192.33 ml  Output    572 ml  Net 3620.33 ml    PHYSICAL EXAMINATION: General: Elderly female on vent, weak  Neuro: Alert, follows commands HEENT: [place]/AT, some small JVD, PERRL Cardiovascular: s1 s2 RRR Lungs: Diminished but clear Abdomen: Soft, non-tender, non-distended Musculoskeletal: No acute deformity or ROM limitation Skin: Grossly intact  LABS:  CBC  Recent Labs Lab 03/15/15 2245 03/16/15 0444 03/17/15 0438  WBC 11.3* 9.1 6.8  HGB 13.0 10.9* 10.1*  HCT 38.3 32.4* 31.2*  PLT 275 224 201   Coag's  Recent Labs Lab 03/10/15 1420 03/15/15 2245  APTT  --  30  INR 1.05 1.02  BMET  Recent Labs Lab 03/15/15 2245 03/16/15 0444 03/17/15 0438  NA 135 136 138  K 3.9 3.7 3.5  CL 100* 103 109  CO2 BUN CREATININE 1.02* 1.10* 0.99  GLUCOSE 170* 106* 94   Electrolytes  Recent Labs Lab 03/15/15 1437 03/15/15 2245 03/16/15 0444 03/17/15 0438  CALCIUM 8.9 8.7* 8.0* 8.0*  MG 1.8 1.7 1.5*  --   PHOS  2.4* 7.2* 5.2*  --    Sepsis Markers  Recent Labs Lab 03/12/15 0520 03/13/15 0422 03/15/15 2245  LATICACIDVEN  --   --  1.4  PROCALCITON 0.47 0.63 0.38   ABG  Recent Labs Lab 03/13/15 0350 03/14/15 0920 03/16/15 0321  PHART 7.43 7.46* 7.449  PCO2ART 35 37 36.3  PO2ART 171* 50* 107.0*   Liver Enzymes  Recent Labs Lab 03/11/15 1615 03/15/15 2245 03/17/15 0438  AST 25 41 23  ALT 12* 26 20  ALKPHOS 94 94 75  BILITOT 1.6* 1.3* 1.0  ALBUMIN 3.5 2.7* 2.2*   Cardiac Enzymes  Recent Labs Lab 03/15/15 2245 03/16/15 0444 03/16/15 1105  TROPONINI <0.03 <0.03 <0.03   Glucose  Recent Labs Lab 03/15/15 1757 03/15/15 2248 03/16/15 0405 03/16/15 1256 03/16/15 2348 03/17/15 0346  GLUCAP 105* 141* 111* 113* 77 83    Imaging Dg Chest Port 1 View  03/17/2015   CLINICAL DATA:  Pneumonia  EXAM: PORTABLE CHEST 1 VIEW  COMPARISON:  03/16/2015  FINDINGS: Endotracheal tube 15 mm above the carina. Left jugular central venous catheter tip in the lower SVC. Right jugular central venous catheter in the upper SVC unchanged.  Worsening left lower lobe effusion and consolidation. No change in right lower lobe airspace disease and smaller right effusion. No definite heart failure.  IMPRESSION: Endotracheal tube remains low, 15 mm above the carina  Progression of left lower lobe consolidation and left effusion. No change in right lower lobe airspace disease and smaller right effusion.   Electronically Signed   By: Marlan Palau M.D.   On: 03/17/2015 07:01   Dg Chest Port 1 View  03/16/2015   CLINICAL DATA:  Status post central line placement  EXAM: PORTABLE CHEST - 1 VIEW  COMPARISON:  03/16/2015  FINDINGS: An endotracheal tube and left jugular central venous catheter are again seen and stable. A new right jugular central venous catheter is noted in the mid to distal superior vena cava. No pneumothorax is seen. Persistent basilar changes are seen, right greater than left. These are stable  from the prior exam. No new focal abnormality is seen.  IMPRESSION: Status post right jugular central line without pneumothorax. The remainder of the exam is stable from the prior study.   Electronically Signed   By: Alcide Clever M.D.   On: 03/16/2015 14:26     ASSESSMENT / PLAN:  PULMONARY OETT 9/29 > 10/1, 10/3 >>> A: Acute respiratory failure in setting myasthenia gravis flare Mucus plugging - S/P bronch & BAL 9/29  P:  Full vent support VAP bundle NIF / FVC after therapy Albuterol nebs q6hr Chest PT q4hr SBT planned  CARDIOVASCULAR CVL LIJ 10/3 > A:  H/O HTN H/O HLD  P:  Monitor on telemetry PRN hydralazine - not required  RENAL A:  Acute Renal Failure - UOP reported low, hypovoolemia? Hypokalemia - resolved Hyperphosphatemia hypomag Pos balance P:  Mag supp Chem in am  Get urine a, urine na, osm - indicate dry, improved crt To 75 cc/hr  GASTROINTESTINAL  A:  Large hiatal hernia Dysphagia  P:  Protonix IV daily for prophylaxis NPO for now NO ROLE TPN , WOULD HARM kub now to assess GI placement done this am  HEMATOLOGIC A: DVT prevention P:  Heparin for VTE prophylaxis SCDs  INFECTIOUS A:  Leukocytosis in setting oral steroids Aspiration PNA - H parahaemolytics on BAL  P:  BCx2 9/29 >>> No growth BAL 9/29 - H parahemolyticus UC - neg  Abx:  Unasyn, start date 10/04>>> Zosyn, start date 9/29 >>>10/04 Vancomycin, start date 9/29 >>>10/04  Add stop date total 7 days, needs one more day  ENDOCRINE A:  Hyperglycemia  P:  Accuchecks q4hr Low dose SSI algorithm  NEUROLOGIC A:  Myasthenia gravis  P:  RASS goal: 0 Fentanyl infusion Continue Mestinon  IV q6hr Holding Solu-Medrol HD catheter placed for PLEX WUA   FAMILY  - Updates: No family at bedside to update. Tried to call daughter  - Inter-disciplinary family meet or Palliative Care meeting due by: 10/6   Katina Degree. Jimmey Ralph, MD [facility]  Family Medicine Resident PGY-2 03/17/2015 7:43 AM    STAFF NOTE: Cindi Carbon, MD FACP have personally reviewed patient's available data, including medical history, events of note, physical examination and test results as part of my evaluation. I have discussed with resident/NP and other care providers such as pharmacist, RN and RRT. In addition, I personally evaluated patient and elicited key findings of: awake, alert, coarse reduce bs bases, for plasma phoresis now, SBT planned cpap 5 ps 5, goal 2 hr, no extubation planned, await at least 3 days further for extubation, daily NF, VC, assess secretion control daily, reduce fluids to 75, crt has improved, assess cough, abx to maintain x 1 more day, upright PT early The patient is critically ill with multiple organ systems failure and requires high complexity decision making for assessment and support, frequent evaluation and titration of therapies, application of advanced monitoring technologies and extensive interpretation of multiple databases.   Critical Care Time devoted to patient care services described in this note is 30 Minutes. This time reflects time of care of this signee: Rory Percy, MD FACP. This critical care time does not reflect procedure time, or teaching time or supervisory time of PA/NP/Med student/Med Resident etc but could involve care discussion time. Rest per NP/medical resident whose note is outlined above and that I agree with   [name]. Tyson Alias, MD, FACP Pgr: [phone] [place] [facility] 03/17/2015 9:58 AM

## 2015-03-17 NOTE — Progress Notes (Signed)
Spoke with night HD RN. Stated she is waiting for pharmacy for medications and loading the machine. Will be at bedside shortly.

## 2015-03-17 NOTE — Clinical Social Work Note (Signed)
CSW consult acknowledged:  Clinical Child psychotherapist received a consult for SNF placement. CSW will await PT/OT evaluations before completing psychosocial assessment.  Derenda Fennel, MSW, LCSWA [phone] 03/17/2015 10:03 AM

## 2015-03-17 NOTE — Progress Notes (Signed)
Dr Randa Evens notified of KUB results. Will come back to place OGT.

## 2015-03-17 NOTE — Progress Notes (Signed)
eLink Physician-Brief Progress Note Patient Name: Cheyenne Boone DOB: 15-Oct-1923 MRN: [id number]   Date of Service  03/17/2015  HPI/Events of Note  Hypoglycemia - blood glucose = 55 >> 0.5 amp D50 >> 69.  eICU Interventions  Will change IV fluid to D5 0.9 NaCl at 75 mL/hour.      Intervention Category Intermediate Interventions: Other:  Lenell Antu 03/17/2015, 8:39 PM

## 2015-03-17 NOTE — Progress Notes (Signed)
The KUB report showed the pediatric NG calling in the hiatal hernia. We returned and continued the procedure with the pediatric scope in a panda tube. The panda tube was placed in the antrum in the scope was withdrawn holding pressure on the tube with a guide wire in place to try to keep it from being removed. KUB will be obtained.

## 2015-03-17 NOTE — Op Note (Signed)
[facility] [address] [place], [address]   ENDOSCOPY PROCEDURE REPORT  PATIENT: Cheyenne Boone, Cheyenne Boone  MR#: [id number] BIRTHDATE: 05-31-1924 , 89  yrs. old GENDER: female ENDOSCOPIST:Chesni Vos, MD REFERRED BY: critical care medicine PROCEDURE DATE:  2015-04-05 PROCEDURE:   EGD with attempted placement of feeding tube ASA CLASS:    class IV INDICATIONS: patient intubated and apparently they have been unable to pass feeding tube due to her hiatal hernia. MEDICATION: she is receiving chronic fentanyl cause of her intubation. She received an additional fentanyl 100 g, versed 2 mg IV TOPICAL ANESTHETIC:  DESCRIPTION OF PROCEDURE:   After the risks and benefits of the procedure were explained, informed consent was obtained.  The [employer] G[employer]OR[employer]COP[employer] [id number]  endoscope was introduced through the mouth  and advanced to the area of the gastric antrum. There was a somewhat large hiatal hernia. We had passed a pediatric NG tube which readily passed into the esophagus. The tip of this was grabbed retriever and placed in the gastric antrum. This was photographed. We then withdrew the scope slowly attempting to advance the NG tube as the scope was withdrawn. se .  The instrument was slowly withdrawn as the mucosa was fully examined. Estimated blood loss is zero unless otherwise noted in this procedure report. after the procedure had been completed, the NG tube was noted to be called in the mouth and it obviously been pulled out with removal of the scope. I did go ahead and pass a panda type feeding tube with a guide wire in place blindly which was felt to be in the vicinity of the stomach.    The scope was then withdrawn from the patient and the procedure completed.  COMPLICATIONS: There were no immediate complications.  ENDOSCOPIC IMPRESSION: attempted passage of feeding tube within the scope unsuccessful. Panda tube subsequently passed location to  be determined RECOMMENDATIONS: will check KUB for placement of feeding tube. If chronic feeding tube needed will need to consider placement of PEG tube.   _______________________________ Rosalie DoctorCarman Ching, MD April 05, 2015 9:51 AM     cc:  CPT CODES: ICD CODES:  The ICD and CPT codes recommended by this software are interpretations from the data that the clinical staff has captured with the software.  The verification of the translation of this report to the ICD and CPT codes and modifiers is the sole responsibility of the health care institution and practicing physician where this report was generated.  [employer] will not be held responsible for the validity of the ICD and CPT codes included on this report.  AMA assumes no liability for data contained or not contained herein. CPT is a Publishing rights manager of the [employer].

## 2015-03-17 NOTE — Progress Notes (Signed)
Pt getting first TPE Tx. Was about a quarter into the Tx when her BP decreased to 60/30 and HR decline to 50. TPE Tx stopped and pt was rinsed back. ICU nurse in the room. Black box called and a fluid bolus and norepi gtt was started. Pt responded well to the fluid bolus and was able to be taken off the norepi gtt. Dr. Roseanne Reno called and informed of pts reaction. Tx aborted for the night

## 2015-03-17 NOTE — Progress Notes (Signed)
Hemodialysis called this am about PLEX and was stated they would arrive on unit around 0900. Then called back to inform RN they would arrive around 1300. At 1500 called HD back, they stated they were waiting for the 1800 RN to come in to perform the PLEX. At 1921, phone call was made to HD unit without an answer. HD still not at bedside to perform PLEX. Day and Night nurse aware of delay and will continue to push the need for PLEX to be performed today.

## 2015-03-17 NOTE — Progress Notes (Signed)
In report from daytime RN, patient had a hypoglycemic event at 1600 as well.  Patient's CBG at 1925 was 55.  One ampule of D50 was administered.  When rechecked it was 69.  Called and received orders for D5NS at 75 ml/hr.  When rechecked at 2055, CBG read "critical greater than 600".  Drew sample off central line, blood glucose measured 89.  When rechecked again at 2211 (from central line) BG was 130.

## 2015-03-17 NOTE — Progress Notes (Signed)
NIF -20, VC 653.

## 2015-03-17 NOTE — Progress Notes (Addendum)
eLink Physician-Brief Progress Note Patient Name: Cheyenne Boone DOB: 17-Sep-1923 MRN: [id number]   Date of Service  03/17/2015  HPI/Events of Note  Getting plasma exchange. Now BP = 63/31 (MAP = 38). Last CVP = 8. Plasma exchange stopped.   eICU Interventions  Will order: 1. 0.9 NaCl 1 liter IV over 1 hour now.  2. Norepinephrine IV infusion.      Intervention Category Major Interventions: Hypotension - evaluation and management  Sommer,Steven Eugene 03/17/2015, 9:48 PM

## 2015-03-18 ENCOUNTER — Encounter (HOSPITAL_COMMUNITY): Payer: Self-pay | Admitting: *Deleted

## 2015-03-18 DIAGNOSIS — G7001 Myasthenia gravis with (acute) exacerbation: Principal | ICD-10-CM

## 2015-03-18 LAB — BLOOD GAS, ARTERIAL
ACID-BASE DEFICIT: 3.6 mmol/L — AB (ref 0.0–2.0)
Bicarbonate: 20.3 mEq/L (ref 20.0–24.0)
DRAWN BY: 42624
FIO2: 0.4
LHR: 12 {breaths}/min
O2 SAT: 99 %
PEEP/CPAP: 5 cmH2O
PH ART: 7.398 (ref 7.350–7.450)
Patient temperature: 99.4
TCO2: 21.4 mmol/L (ref 0–100)
VT: 400 mL
pCO2 arterial: 33.9 mmHg — ABNORMAL LOW (ref 35.0–45.0)
pO2, Arterial: 150 mmHg — ABNORMAL HIGH (ref 80.0–100.0)

## 2015-03-18 LAB — POCT I-STAT, CHEM 8
BUN: 12 mg/dL (ref 6–20)
CALCIUM ION: 1.26 mmol/L (ref 1.13–1.30)
CHLORIDE: 107 mmol/L (ref 101–111)
CREATININE: 0.9 mg/dL (ref 0.44–1.00)
GLUCOSE: 149 mg/dL — AB (ref 65–99)
HCT: 30 % — ABNORMAL LOW (ref 36.0–46.0)
HEMOGLOBIN: 10.2 g/dL — AB (ref 12.0–15.0)
POTASSIUM: 3.4 mmol/L — AB (ref 3.5–5.1)
Sodium: 142 mmol/L (ref 135–145)
TCO2: 21 mmol/L (ref 0–100)

## 2015-03-18 LAB — BASIC METABOLIC PANEL
Anion gap: 8 (ref 5–15)
BUN: 9 mg/dL (ref 6–20)
CHLORIDE: 112 mmol/L — AB (ref 101–111)
CO2: 22 mmol/L (ref 22–32)
CREATININE: 0.92 mg/dL (ref 0.44–1.00)
Calcium: 8.4 mg/dL — ABNORMAL LOW (ref 8.9–10.3)
GFR calc Af Amer: >60 mL/min (ref >60)
GFR calc non Af Amer: 54 mL/min — ABNORMAL LOW (ref >60)
GLUCOSE: 123 mg/dL — AB (ref 65–99)
POTASSIUM: 3.2 mmol/L — AB (ref 3.5–5.1)
SODIUM: 142 mmol/L (ref 135–145)

## 2015-03-18 LAB — PHOSPHORUS: Phosphorus: 1.3 mg/dL — ABNORMAL LOW (ref 2.5–4.6)

## 2015-03-18 LAB — CBC
HCT: 30.1 % — ABNORMAL LOW (ref 36.0–46.0)
HEMOGLOBIN: 9.9 g/dL — AB (ref 12.0–15.0)
MCH: 30.8 pg (ref 26.0–34.0)
MCHC: 32.9 g/dL (ref 30.0–36.0)
MCV: 93.8 fL (ref 78.0–100.0)
Platelets: 207 10*3/uL (ref 150–400)
RBC: 3.21 MIL/uL — AB (ref 3.87–5.11)
RDW: 14.7 % (ref 11.5–15.5)
WBC: 8.3 10*3/uL (ref 4.0–10.5)

## 2015-03-18 LAB — GLUCOSE, CAPILLARY
GLUCOSE-CAPILLARY: 110 mg/dL — AB (ref 65–99)
GLUCOSE-CAPILLARY: 67 mg/dL (ref 65–99)
GLUCOSE-CAPILLARY: 96 mg/dL (ref 65–99)
Glucose-Capillary: 112 mg/dL — ABNORMAL HIGH (ref 65–99)
Glucose-Capillary: 109 mg/dL — ABNORMAL HIGH (ref 65–99)
Glucose-Capillary: 62 mg/dL — ABNORMAL LOW (ref 65–99)
Glucose-Capillary: 106 mg/dL — ABNORMAL HIGH (ref 65–99)

## 2015-03-18 LAB — MAGNESIUM: MAGNESIUM: 1.6 mg/dL — AB (ref 1.7–2.4)

## 2015-03-18 MED ORDER — DEXTROSE 50 % IV SOLN
INTRAVENOUS | Status: AC
  Filled 2015-03-18: qty 50

## 2015-03-18 MED ORDER — DEXTROSE 50 % IV SOLN
25.0000 mL | Freq: Once | INTRAVENOUS | Status: AC
  Administered 2015-03-18: 25 mL via INTRAVENOUS
  Filled 2015-03-18: qty 50

## 2015-03-18 MED ORDER — DIPHENHYDRAMINE HCL 12.5 MG/5ML PO ELIX
12.5000 mg | ORAL_SOLUTION | Freq: Four times a day (QID) | ORAL | Status: DC | PRN
  Administered 2015-03-18 – 2015-03-20 (×2): 12.5 mg via ORAL
  Filled 2015-03-18 (×3): qty 5

## 2015-03-18 MED ORDER — VITAL HIGH PROTEIN PO LIQD
1000.0000 mL | ORAL | Status: DC
  Administered 2015-03-18: 1000 mL
  Filled 2015-03-18 (×4): qty 1000

## 2015-03-18 MED ORDER — POTASSIUM CHLORIDE 20 MEQ/15ML (10%) PO SOLN
20.0000 meq | ORAL | Status: AC
  Administered 2015-03-18 (×2): 20 meq
  Filled 2015-03-18 (×2): qty 15

## 2015-03-18 MED ORDER — PRO-STAT SUGAR FREE PO LIQD
30.0000 mL | Freq: Two times a day (BID) | ORAL | Status: DC
Start: 2015-03-18 — End: 2015-03-19
  Administered 2015-03-18 – 2015-03-19 (×2): 30 mL
  Filled 2015-03-18 (×3): qty 30

## 2015-03-18 MED ORDER — DEXTROSE-NACL 5-0.45 % IV SOLN
INTRAVENOUS | Status: DC
  Administered 2015-03-18: 11:00:00 via INTRAVENOUS
  Administered 2015-03-22: 10 mL/h via INTRAVENOUS

## 2015-03-18 NOTE — Progress Notes (Signed)
PULMONARY / CRITICAL CARE MEDICINE   Name: Cheyenne Boone MRN: [id number] DOB: 04/16/1924    ADMISSION DATE:  03/15/2015 CONSULTATION DATE:  03/15/2015  REFERRING MD :  Jamison Neighbor  CHIEF COMPLAINT:  Dysphagia  INITIAL PRESENTATION: 81 year old female with 10 day history of dysphagia. Was admitted to [facility] for what was suspected to be myasthenia gravis flare. She was started on on IVIG and mestinon, however eventually required intubation. Transferred to [facility] at family request and for possible PLEX. PCCM to admit.   STUDIES:  9/27 CT head - Stable noncontrast CT appearance of the brain since May. No acute intracranial abnormality  9/27 Barium swallow - Nondiagnostic barium swallow examination due to the patient's inability to more than tiny amounts of the barium, Possible mass in the AP window. Possible infrahilar pneumonia on the left.  9/27 CT chest - Significant coronary artery disease, mitral annulus and aortic valve disease. Tortuous aorta without aneurysm. Large hiatal hernia. No associated obstruction.  9/27 CT soft tissue neck - Moderate calcified plaque is noted in proximal right internal carotid artery; carotid ultrasound is recommended for further evaluation. Fluid is noted in the right mastoid air cells. Clinical correlation is recommended to rule out mastoiditis.  9/28 MRI brain - No acute intracranial abnormality. Chronic small vessel ischemic disease is stable since 2015. Chronic cervical spine degeneration with C3-C4 and C4-C5 spinal stenosis which does not appear significantly progressed since 2015.  9/28 Carotid doppler - Right carotid bifurcation and proximal ICA plaque, resulting in less than 50% diameter stenosis. The exam does not exclude plaque ulceration or embolization.  SIGNIFICANT EVENTS: 9/27 - Admit for dysphagia  9/29 - Transfer to ICU @ [facility] for respiratory failure & intubated>>Bronchoscopy with bilateral secretions & plugging 10/01 - Extubated 10/03 -  Reintubated 10/03 - Transfer to [facility] from [facility] 10/4- HD cath placed 10/5 PLEX planned, EGD for OGT placement. PLEX stopped due to hypotension  SUBJECTIVE:  Awake, intubated, follows commands  VITAL SIGNS: Temp:  [98 F (36.7 C)-99.8 F (37.7 C)] 98.8 F (37.1 C) (10/06 0749) Pulse Rate:  [64-114] 84 (10/06 0700) Resp:  [9-29] 15 (10/06 0700) BP: (60-195)/(30-97) 168/78 mmHg (10/06 0700) SpO2:  [90 %-100 %] 100 % (10/06 0700) FiO2 (%):  [40 %] 40 % (10/06 0758) Weight:  [117 lb (53.071 kg)-134 lb 0.6 oz (60.8 kg)] 134 lb 0.6 oz (60.8 kg) (10/06 0500) HEMODYNAMICS: CVP:  [8 mmHg-9 mmHg] 8 mmHg VENTILATOR SETTINGS: Vent Mode:  [-] PSV;CPAP FiO2 (%):  [40 %] 40 % Set Rate:  [12 bmp] 12 bmp Vt Set:  [400 mL] 400 mL PEEP:  [5 cmH20] 5 cmH20 Pressure Support:  [5 cmH20] 5 cmH20 Plateau Pressure:  [12 cmH20-19 cmH20] 17 cmH20 INTAKE / OUTPUT:  Intake/Output Summary (Last 24 hours) at 03/18/15 0808 Last data filed at 03/18/15 0700  Gross per 24 hour  Intake 3520.29 ml  Output    935 ml  Net 2585.29 ml    PHYSICAL EXAMINATION: General: Elderly female on vent, weak  Neuro: Alert, follows commands HEENT: [place]/AT, some small JVD, PERRL Cardiovascular: s1 s2 RRR Lungs: Diminished but clear Abdomen: Soft, non-tender, non-distended Musculoskeletal: No acute deformity or ROM limitation Skin: Grossly intact  LABS:  CBC  Recent Labs Lab 03/16/15 0444 03/17/15 0438 03/18/15 0343  WBC 9.1 6.8 8.3  HGB 10.9* 10.1* 9.9*  HCT 32.4* 31.2* 30.1*  PLT 224 201 207   Coag's  Recent Labs Lab 03/15/15 2245  APTT 30  INR 1.02  BMET  Recent Labs Lab 03/16/15 0444 03/17/15 0438 03/18/15 0343  NA 136 138 142  K 3.7 3.5 3.2*  CL 103 109 112*  CO2 25 23 22   BUN 15 12 9   CREATININE 1.10* 0.99 0.92  GLUCOSE 106* 94 123*   Electrolytes  Recent Labs Lab 03/15/15 1437 03/15/15 2245 03/16/15 0444 03/17/15 0438 03/18/15 0343  CALCIUM 8.9 8.7* 8.0*  8.0* 8.4*  MG 1.8 1.7 1.5*  --   --   PHOS 2.4* 7.2* 5.2*  --   --    Sepsis Markers  Recent Labs Lab 03/12/15 0520 03/13/15 0422 03/15/15 2245  LATICACIDVEN  --   --  1.4  PROCALCITON 0.47 0.63 0.38   ABG  Recent Labs Lab 03/14/15 0920 03/16/15 0321 03/18/15 0425  PHART 7.46* 7.449 7.398  PCO2ART 37 36.3 33.9*  PO2ART 50* 107.0* 150*   Liver Enzymes  Recent Labs Lab 03/11/15 1615 03/15/15 2245 03/17/15 0438  AST 25 41 23  ALT 12* 26 20  ALKPHOS 94 94 75  BILITOT 1.6* 1.3* 1.0  ALBUMIN 3.5 2.7* 2.2*   Cardiac Enzymes  Recent Labs Lab 03/15/15 2245 03/16/15 0444 03/16/15 1105  TROPONINI <0.03 <0.03 <0.03   Glucose  Recent Labs Lab 03/17/15 2047 03/17/15 2055 03/17/15 2107 03/17/15 2211 03/17/15 2328 03/18/15 0319  GLUCAP 52* >600* 89 130* 112* 110*    Imaging Dg Abd Portable 1v  03/17/2015   CLINICAL DATA:  Encounter for feeding tube placement  EXAM: PORTABLE ABDOMEN - 1 VIEW  COMPARISON:  Portable exam 1515 hours compared to 03/17/2015 at 0957 hours  FINDINGS: Tip of feeding tube projects over mid to distal stomach.  Nonobstructive bowel gas pattern.  No bowel dilatation or bowel wall thickening.  Suspected descending and sigmoid colonic diverticulosis.  Scattered atherosclerotic calcifications.  Bones demineralized with dextro convex thoracolumbar scoliosis.  Curvilinear densities project over the RIGHT iliac bone question stool artifacts, injection granulomata and buttock, felt unlikely to be gallstones due to position.  IMPRESSION: Tip of feeding tube projects over mid to distal stomach.   Electronically Signed   By: Ulyses Southward M.D.   On: 03/17/2015 15:39   Dg Abd Portable 1v  03/17/2015   CLINICAL DATA:  81 year old female nasogastric tube placement. Hiatal hernia. Initial encounter.  EXAM: PORTABLE ABDOMEN - 1 VIEW  COMPARISON:  Feeding tube placement images 10316 and earlier. CT Abdomen and Pelvis 03/10/2013.  FINDINGS: Portable AP supine view  at 0957 hours. Stable feeding tube tip position since 03/15/2015, tip at the level of the intra thoracic gastric hernia. The tube course does not correspond to the right bronchus. Non obstructed bowel gas pattern. Severe diverticulosis of the colon. Scoliosis and aortoiliac calcified atherosclerosis. Partially visible proximal left femur hardware.  IMPRESSION: 1. Stable feeding tube tip within the hiatal hernia in the chest. 2. Non obstructed bowel gas pattern. Severe colon diverticulosis. Calcified aortic atherosclerosis.   Electronically Signed   By: Odessa Fleming M.D.   On: 03/17/2015 10:06     ASSESSMENT / PLAN:  PULMONARY OETT 9/29 > 10/1, 10/3 >>> A: Acute respiratory failure in setting myasthenia gravis flare Mucus plugging - S/P bronch & BAL 9/29  P:  Full vent support VAP bundle NIF / FVC after therapy, pt effort affecting this  Albuterol nebs q6hr, dc Chest PT dc SBT planned, cpap 5 ps 5 pcxr in am after volume give  CARDIOVASCULAR CVL LIJ 10/3 > A:  H/O HTN H/O HLD Levophed off in minutes P:  Monitor on telemetry PRN hydralazine - not required HTn remains , add oral if remains HTn after plex started  RENAL A:  Acute Renal Failure - UOP reported low, hypovolemia? Hypokalemia - resolved Hyperphosphatemia hypomag Pos balance P:  Mag supp Chem in am  Get urine a, urine na, osm - indicate dry, improved crt Change to 1./2 NS, kvo k supp  GASTROINTESTINAL A:  Large hiatal hernia Dysphagia Difficult NGT placement P:  Protonix IV daily for prophylaxis start feeds  HEMATOLOGIC A: DVT prevention P:  Heparin for VTE prophylaxis SCDs  INFECTIOUS A:  Leukocytosis in setting oral steroids Aspiration PNA - H parahaemolytics on BAL  P:  BCx2 9/29 >>> No growth BAL 9/29 - H parahemolyticus UC - neg  Abx:  Unasyn, start date 10/04>>>allow to dc today 6th Zosyn, start date 9/29 >>>10/04 Vancomycin, start date 9/29 >>>10/04  Last day of  antibiotics 10/6  ENDOCRINE A:  Hyperglycemia  P:  Accuchecks q4hr Low dose SSI algorithm  NEUROLOGIC A:  Myasthenia gravis  P:  RASS goal: 0 Fentanyl infusion Continue Mestinon  IV q6hr Holding Solu-Medrol HD catheter placed for PLEX, today repeat WUA   FAMILY  - Updates: No family at bedside to update. Tried to call daughter  - Inter-disciplinary family meet or Palliative Care meeting due by: 10/6   Katina Degree. Jimmey Ralph, MD [facility] Family Medicine Resident PGY-2 03/18/2015 8:08 AM   STAFF NOTE: Cindi Carbon, MD FACP have personally reviewed patient's available data, including medical history, events of note, physical examination and test results as part of my evaluation. I have discussed with resident/NP and other care providers such as pharmacist, RN and RRT. In addition, I personally evaluated patient and elicited key findings of: more awake, it is her Devin Going, will sign happy bday to her, less coarse BS, awake, some confusion, for PLEX again today, NIF / VC NOT adequate as of now, repeat after further treatment, wean SBT, cpap5 ps 5, no extubation planned, kvo, avoid saline Cl rising, allow unasyn to dc today, PT as able Follow secretion status remain moderate The patient is critically ill with multiple organ systems failure and requires high complexity decision making for assessment and support, frequent evaluation and titration of therapies, application of advanced monitoring technologies and extensive interpretation of multiple databases.   Critical Care Time devoted to patient care services described in this note is30 Minutes. This time reflects time of care of this signee: Rory Percy, MD FACP. This critical care time does not reflect procedure time, or teaching time or supervisory time of PA/NP/Med student/Med Resident etc but could involve care discussion time. Rest per NP/medical resident whose note is outlined above and that I agree  with   [name]. Tyson Alias, MD, FACP Pgr: [phone]  Pulmonary & Critical Care 03/18/2015 9:55 AM

## 2015-03-18 NOTE — Progress Notes (Signed)
Pts blood sugar at 1522 was 62, gave 25 mL of D50. Rechecked blood sugar at 1605 it was 106. Will continue to monitor CBGs.

## 2015-03-18 NOTE — Progress Notes (Signed)
[facility] ADULT ICU REPLACEMENT PROTOCOL FOR AM LAB REPLACEMENT ONLY  The patient does apply for the [facility] Adult ICU Electrolyte Replacment Protocol based on the criteria listed below:   1. Is GFR >/= 40 ml/min? Yes.    Patient's GFR today is 54 2. Is urine output >/= 0.5 ml/kg/hr for the last 6 hours? Yes.   Patient's UOP is 1.9 ml/kg/hr 3. Is BUN < 60 mg/dL? Yes.    Patient's BUN today is 9 4. Abnormal electrolyte(s):K3.2 5. Ordered repletion with:per protocol 6. If a panic level lab has been reported, has the CCM MD in charge been notified? Yes.  .   Physician:  Belia Heman MD  Melrose Nakayama 03/18/2015 6:18 AM

## 2015-03-18 NOTE — Progress Notes (Signed)
eLink Physician-Brief Progress Note Patient Name: Cheyenne Boone DOB: Apr 13, 1924 MRN: [id number]   Date of Service  03/18/2015  HPI/Events of Note  Pruritis.  eICU Interventions  Will order Benadryl elixir 12.5 mg via tube Q 6 hours PRN itching.     Intervention Category Intermediate Interventions: Other:  Akbar Sacra Dennard Nip 03/18/2015, 10:05 PM

## 2015-03-18 NOTE — Evaluation (Signed)
Physical Therapy Evaluation Patient Details Name: Cheyenne Boone MRN: [id number] DOB: Mar 13, 1924 Today's Date: 03/18/2015   History of Present Illness  Pt is an 81 year old female with 10 day history of dysphagia. Was admitted to [facility] for what was suspected to be myasthenia gravis flare. She was started on on IVIG and mestinon, however eventually required intubation. Transferred to [facility] at family request and for possible PLEX. On 10/5 PLEX planned, EGD for OGT placement. PLEX stopped due to hypotension. PMH includes severe cervical disc degeneration, HTN, and prior stroke.  Clinical Impression  Pt admitted with above diagnosis. Pt currently with functional limitations due to the deficits listed below (see PT Problem List). At the time of PT eval pt was able to perform transfers with +2 assist mainly for management of lines/equipment, but will require +2 physical assist in future sessions for gait training. Pt will benefit from skilled PT to increase their independence and safety with mobility to allow discharge to the venue listed below.       Follow Up Recommendations SNF;Supervision/Assistance - 24 hour    Equipment Recommendations  None recommended by PT    Recommendations for Other Services       Precautions / Restrictions Precautions Precautions: Fall Precaution Comments: On vent Restrictions Weight Bearing Restrictions: No      Mobility  Bed Mobility Overal bed mobility: Needs Assistance Bed Mobility: Supine to Sit     Supine to sit: Min assist     General bed mobility comments: Pt with mitts on however still able to use UE's to transition to EOB. Assist provided for LE movement towards EOB, and trunk elevation to full sitting position.   Transfers Overall transfer level: Needs assistance Equipment used: 1 person hand held assist Transfers: Sit to/from [employer] Sit to Stand: Max assist Stand pivot transfers: Max assist       General  transfer comment: Face-to-face transfer with gait belt and second person to manage lines. Pt able to reach for arm rests on chair for support fairly well.   Ambulation/Gait             General Gait Details: Unable at this time.   Stairs            Wheelchair Mobility    Modified Rankin (Stroke Patients Only)       Balance Overall balance assessment: Needs assistance;History of Falls Sitting-balance support: Feet supported;Bilateral upper extremity supported Sitting balance-Leahy Scale: Poor     Standing balance support: Bilateral upper extremity supported;During functional activity Standing balance-Leahy Scale: Zero                               Pertinent Vitals/Pain Pain Assessment: No/denies pain    Home Living Family/patient expects to be discharged to:: Private residence Living Arrangements: Alone Available Help at Discharge: Family;Available PRN/intermittently Type of Home: House Home Access: Stairs to enter Entrance Stairs-Rails: Right Entrance Stairs-Number of Steps: 3 Home Layout: One level Home Equipment: Walker - 4 wheels;Cane - single point;Bedside commode;Hand held shower head;Shower seat;Walker - 2 wheels Additional Comments: [employer]. Most history was taken from prior admission due to limited communication as pt ventillated.     Prior Function Level of Independence: Independent with assistive device(s)         Comments: It appears that pt was largely independent with RW use. Pt indicated that she used a RW at home and lives alone but was unable  to provide much other PLOF.      Hand Dominance        Extremity/Trunk Assessment   Upper Extremity Assessment: Defer to OT evaluation           Lower Extremity Assessment: Generalized weakness      Cervical / Trunk Assessment: Kyphotic  Communication   Communication: HOH;Expressive difficulties (on vent)  Cognition Arousal/Alertness: Awake/alert Behavior During Therapy:  Restless Overall Cognitive Status: Difficult to assess                      General Comments      Exercises        Assessment/Plan    PT Assessment Patient needs continued PT services  PT Diagnosis Difficulty walking;Generalized weakness   PT Problem List Decreased strength;Decreased range of motion;Decreased activity tolerance;Decreased balance;Decreased mobility;Decreased knowledge of use of DME;Decreased safety awareness;Decreased knowledge of precautions;Cardiopulmonary status limiting activity  PT Treatment Interventions DME instruction;Gait training;Stair training;Functional mobility training;Therapeutic activities;Therapeutic exercise;Neuromuscular re-education;Patient/family education   PT Goals (Current goals can be found in the Care Plan section) Acute Rehab PT Goals Patient Stated Goal: Pt did not state goals at this time.  PT Goal Formulation: Patient unable to participate in goal setting Time For Goal Achievement: 04/01/15 Potential to Achieve Goals: Fair    Frequency Min 2X/week   Barriers to discharge Decreased caregiver support Lives alone    Co-evaluation               End of Session Equipment Utilized During Treatment: Gait belt;Oxygen Activity Tolerance: Patient limited by fatigue Patient left: in chair;with call bell/phone within reach;Other (comment) (Respiratory therapy and NT present in room) Nurse Communication: Mobility status         Time: 1610-9604 PT Time Calculation (min) (ACUTE ONLY): 25 min   Charges:   PT Evaluation $Initial PT Evaluation Tier I: 1 Procedure PT Treatments $Therapeutic Activity: 8-22 mins   PT G Codes:        Conni Slipper March 29, 2015, 1:39 PM   Conni Slipper, PT, DPT Acute Rehabilitation Services Pager: [phone]

## 2015-03-18 NOTE — Progress Notes (Signed)
Wasted 50 mLs of expired bag of fentanyl in sink with Irven Coe, RN.

## 2015-03-18 NOTE — Progress Notes (Signed)
NEURO HOSPITALIST PROGRESS NOTE   SUBJECTIVE:                                                                                                                        Intubated on the vent but alert and awake, following commands without difficulty. Became significantly hypotensive during PLEX last night, with BP 63/31 requiring a short trial of Neo + 0.9 NaCl 1 liter, and thus plasma exchange was stopped.  NIF less than 20, VC 514. She is weaning well. PH 7.33, pCO2 33.9, pO2 150, FIO2 0.40. UA without infection. CXR 10/5: worsening left lower lobe effusion and consolidation.  OBJECTIVE:                                                                                                                           Vital signs in last 24 hours: Temp:  [98 F (36.7 C)-99.8 F (37.7 C)] 98.8 F (37.1 C) (10/06 0749) Pulse Rate:  [64-114] 84 (10/06 0700) Resp:  [9-29] 15 (10/06 0700) BP: (60-195)/(30-97) 168/78 mmHg (10/06 0700) SpO2:  [90 %-100 %] 100 % (10/06 0700) FiO2 (%):  [40 %] 40 % (10/06 0758) Weight:  [53.071 kg (117 lb)-60.8 kg (134 lb 0.6 oz)] 60.8 kg (134 lb 0.6 oz) (10/06 0500)  Intake/Output from previous day: 10/05 0701 - 10/06 0700 In: 3670.3 [I.V.:2260.3; NG/GT:70; IV Piggyback:1340] Out: 965 [Urine:965] Intake/Output this shift:   Nutritional status: Diet NPO time specified  Past Medical History  Diagnosis Date  . Hypertension   . Hypercholesteremia   . Stroke [facility])    Physical exam:  Constitutional: elderly female, intubated on the vent, in no apparent distress. Eyes: no jaundice or exophthalmos.  Head: normocephalic. Neck: supple, no bruits, no JVD. Cardiac: no murmurs. Lungs: clear. Abdomen: soft, no tender, no mass. Extremities: no edema, clubbing, or cyanosis.  Skin: no rash  Neurologic Exam:  General: NAD Mental Status: Intubated on the vent but alert and awake and following commands.  Cranial Nerves: II:  Discs flat bilaterally; Visual fields grossly normal, pupils equal, round, reactive to light and accommodation III,IV, VI: mild left ptosis, extra-ocular motions intact bilaterally V,VII: smile symmetric, facial light touch sensation normal bilaterally VIII: hearing normal bilaterally IX,X: intubated XI: bilateral  shoulder shrug no tested XII: intubated  Motor: Moves all limbs symmetrically Sensory: Pinprick and light touch intact throughout, bilaterally Deep Tendon Reflexes:  1+ all over Plantars: Right: downgoingLeft: downgoing Cerebellar: No tested Gait:  Unable to test due to multiple leads  Lab Results: Lab Results  Component Value Date/Time   CHOL 131 03/10/2015 08:35 PM   CHOL 154 12/17/2013 03:26 PM   Lipid Panel No results for input(s): CHOL, TRIG, HDL, CHOLHDL, VLDL, LDLCALC in the last 72 hours.  Studies/Results: Dg Chest Port 1 View  03/17/2015   CLINICAL DATA:  Pneumonia  EXAM: PORTABLE CHEST 1 VIEW  COMPARISON:  03/16/2015  FINDINGS: Endotracheal tube 15 mm above the carina. Left jugular central venous catheter tip in the lower SVC. Right jugular central venous catheter in the upper SVC unchanged.  Worsening left lower lobe effusion and consolidation. No change in right lower lobe airspace disease and smaller right effusion. No definite heart failure.  IMPRESSION: Endotracheal tube remains low, 15 mm above the carina  Progression of left lower lobe consolidation and left effusion. No change in right lower lobe airspace disease and smaller right effusion.   Electronically Signed   By: Marlan Palau M.D.   On: 03/17/2015 07:01   Dg Chest Port 1 View  03/16/2015   CLINICAL DATA:  Status post central line placement  EXAM: PORTABLE CHEST - 1 VIEW  COMPARISON:  03/16/2015  FINDINGS: An endotracheal tube and left jugular central venous catheter are again seen and stable. A new right jugular central venous catheter is noted in the mid to  distal superior vena cava. No pneumothorax is seen. Persistent basilar changes are seen, right greater than left. These are stable from the prior exam. No new focal abnormality is seen.  IMPRESSION: Status post right jugular central line without pneumothorax. The remainder of the exam is stable from the prior study.   Electronically Signed   By: Alcide Clever M.D.   On: 03/16/2015 14:26   Dg Abd Portable 1v  03/17/2015   CLINICAL DATA:  Encounter for feeding tube placement  EXAM: PORTABLE ABDOMEN - 1 VIEW  COMPARISON:  Portable exam 1515 hours compared to 03/17/2015 at 0957 hours  FINDINGS: Tip of feeding tube projects over mid to distal stomach.  Nonobstructive bowel gas pattern.  No bowel dilatation or bowel wall thickening.  Suspected descending and sigmoid colonic diverticulosis.  Scattered atherosclerotic calcifications.  Bones demineralized with dextro convex thoracolumbar scoliosis.  Curvilinear densities project over the RIGHT iliac bone question stool artifacts, injection granulomata and buttock, felt unlikely to be gallstones due to position.  IMPRESSION: Tip of feeding tube projects over mid to distal stomach.   Electronically Signed   By: Ulyses Southward M.D.   On: 03/17/2015 15:39   Dg Abd Portable 1v  03/17/2015   CLINICAL DATA:  81 year old female nasogastric tube placement. Hiatal hernia. Initial encounter.  EXAM: PORTABLE ABDOMEN - 1 VIEW  COMPARISON:  Feeding tube placement images 10316 and earlier. CT Abdomen and Pelvis 03/10/2013.  FINDINGS: Portable AP supine view at 0957 hours. Stable feeding tube tip position since 03/15/2015, tip at the level of the intra thoracic gastric hernia. The tube course does not correspond to the right bronchus. Non obstructed bowel gas pattern. Severe diverticulosis of the colon. Scoliosis and aortoiliac calcified atherosclerosis. Partially visible proximal left femur hardware.  IMPRESSION: 1. Stable feeding tube tip within the hiatal hernia in the chest. 2. Non  obstructed bowel gas pattern. Severe colon diverticulosis. Calcified aortic  atherosclerosis.   Electronically Signed   By: Odessa Fleming M.D.   On: 03/17/2015 10:06    MEDICATIONS                                                                                                                        Scheduled: . albuterol  2.5 mg Nebulization Q6H  . antiseptic oral rinse  7 mL Mouth Rinse QID  . calcium gluconate IVPB  4 g Intravenous Once  . calcium gluconate  2 g Intravenous Once  . chlorhexidine gluconate  15 mL Mouth Rinse BID  . dextrose      . fentaNYL (SUBLIMAZE) injection  50 mcg Intravenous Once  . heparin  1,000 Units Intracatheter Once  . heparin  1,000 Units Intracatheter Once  . heparin  5,000 Units Subcutaneous 3 times per day  . insulin aspart  0-9 Units Subcutaneous 6 times per day  . pantoprazole (PROTONIX) IV  40 mg Intravenous QHS  . potassium chloride  20 mEq Per Tube Q4H  . pyridostigmine  2 mg Intravenous 4 times per day    ASSESSMENT/PLAN:                                                                                                           81 y/o transferred to [facility] for further management of likely MG crisis with respiratory failure. Patient is intubated on the vent but alert, awake and able to follow commands.  NIF less than 20, VC 514 but is weaning well. Had IVIG x 1 dose at [facility]. Couldn't complete PLEX last night due to marked hypotension. Plan: hydrate and re attempt PLEX. Continue IV mestinon. Back to IVIG if unable to tolerate PLEX. Will follow up.  Wyatt Portela, MD [facility] [phone]  03/18/2015, 8:25 AM

## 2015-03-18 NOTE — Progress Notes (Signed)
NIF less than 20, VC 514.

## 2015-03-19 ENCOUNTER — Inpatient Hospital Stay (HOSPITAL_COMMUNITY): Payer: Commercial Managed Care - HMO

## 2015-03-19 DIAGNOSIS — J189 Pneumonia, unspecified organism: Secondary | ICD-10-CM

## 2015-03-19 LAB — CBC
HCT: 26.5 % — ABNORMAL LOW (ref 36.0–46.0)
Hemoglobin: 9 g/dL — ABNORMAL LOW (ref 12.0–15.0)
MCH: 31.5 pg (ref 26.0–34.0)
MCHC: 34 g/dL (ref 30.0–36.0)
MCV: 92.7 fL (ref 78.0–100.0)
PLATELETS: 174 10*3/uL (ref 150–400)
RBC: 2.86 MIL/uL — AB (ref 3.87–5.11)
RDW: 14.4 % (ref 11.5–15.5)
WBC: 6.6 10*3/uL (ref 4.0–10.5)

## 2015-03-19 LAB — POCT I-STAT, CHEM 8
BUN: 12 mg/dL (ref 6–20)
CHLORIDE: 106 mmol/L (ref 101–111)
Calcium, Ion: 1.23 mmol/L (ref 1.13–1.30)
Creatinine, Ser: 0.8 mg/dL (ref 0.44–1.00)
Glucose, Bld: 101 mg/dL — ABNORMAL HIGH (ref 65–99)
HEMATOCRIT: 25 % — AB (ref 36.0–46.0)
Hemoglobin: 8.5 g/dL — ABNORMAL LOW (ref 12.0–15.0)
Potassium: 3.8 mmol/L (ref 3.5–5.1)
SODIUM: 142 mmol/L (ref 135–145)
TCO2: 23 mmol/L (ref 0–100)

## 2015-03-19 LAB — PHOSPHORUS
Phosphorus: 1.9 mg/dL — ABNORMAL LOW (ref 2.5–4.6)
Phosphorus: 3.2 mg/dL (ref 2.5–4.6)

## 2015-03-19 LAB — BASIC METABOLIC PANEL
Anion gap: 7 (ref 5–15)
BUN: 6 mg/dL (ref 6–20)
CHLORIDE: 107 mmol/L (ref 101–111)
CO2: 24 mmol/L (ref 22–32)
CREATININE: 0.81 mg/dL (ref 0.44–1.00)
Calcium: 8.3 mg/dL — ABNORMAL LOW (ref 8.9–10.3)
GFR calc Af Amer: >60 mL/min (ref >60)
GFR calc non Af Amer: >60 mL/min (ref >60)
GLUCOSE: 111 mg/dL — AB (ref 65–99)
Potassium: 3.2 mmol/L — ABNORMAL LOW (ref 3.5–5.1)
Sodium: 138 mmol/L (ref 135–145)

## 2015-03-19 LAB — MAGNESIUM
MAGNESIUM: 1.7 mg/dL (ref 1.7–2.4)
Magnesium: 2.1 mg/dL (ref 1.7–2.4)

## 2015-03-19 LAB — GLUCOSE, CAPILLARY
GLUCOSE-CAPILLARY: 108 mg/dL — AB (ref 65–99)
Glucose-Capillary: 97 mg/dL (ref 65–99)

## 2015-03-19 MED ORDER — LATANOPROST 0.005 % OP SOLN
1.0000 [drp] | Freq: Every day | OPHTHALMIC | Status: DC
  Administered 2015-03-19 – 2015-04-06 (×19): 1 [drp] via OPHTHALMIC
  Filled 2015-03-19: qty 2.5

## 2015-03-19 MED ORDER — VITAL AF 1.2 CAL PO LIQD
1000.0000 mL | ORAL | Status: DC
  Administered 2015-03-19 – 2015-03-23 (×4): 1000 mL
  Filled 2015-03-19 (×7): qty 1000

## 2015-03-19 MED ORDER — ACETYLCYSTEINE 20 % IN SOLN
4.0000 mL | Freq: Two times a day (BID) | RESPIRATORY_TRACT | Status: AC
  Administered 2015-03-19 – 2015-03-20 (×4): 4 mL via RESPIRATORY_TRACT
  Filled 2015-03-19 (×4): qty 4

## 2015-03-19 MED ORDER — HEPARIN SODIUM (PORCINE) 1000 UNIT/ML IJ SOLN: 1000.0000 [IU] | Freq: Once | INTRAMUSCULAR | Status: DC

## 2015-03-19 MED ORDER — HYPROMELLOSE (GONIOSCOPIC) 2.5 % OP SOLN
1.0000 [drp] | Freq: Every day | OPHTHALMIC | Status: DC
  Filled 2015-03-19: qty 15

## 2015-03-19 MED ORDER — MAGNESIUM SULFATE 2 GM/50ML IV SOLN
2.0000 g | Freq: Once | INTRAVENOUS | Status: AC
  Administered 2015-03-19: 2 g via INTRAVENOUS
  Filled 2015-03-19: qty 50

## 2015-03-19 MED ORDER — SODIUM CHLORIDE 0.9 % IV SOLN
1.5000 g | Freq: Two times a day (BID) | INTRAVENOUS | Status: DC
  Filled 2015-03-19 (×2): qty 1.5

## 2015-03-19 MED ORDER — ACD FORMULA A 0.73-2.45-2.2 GM/100ML VI SOLN
500.0000 mL | Status: DC
  Administered 2015-03-22: 500 mL via INTRAVENOUS
  Filled 2015-03-19 (×2): qty 500

## 2015-03-19 MED ORDER — SODIUM CHLORIDE 0.9 % IV BOLUS (SEPSIS)
1000.0000 mL | Freq: Once | INTRAVENOUS | Status: AC
  Administered 2015-03-19: 1000 mL via INTRAVENOUS

## 2015-03-19 MED ORDER — SODIUM CHLORIDE 0.9 % IV SOLN
4.0000 g | Freq: Once | INTRAVENOUS | Status: AC
  Administered 2015-03-19: 2 g via INTRAVENOUS
  Filled 2015-03-19: qty 40

## 2015-03-19 MED ORDER — SODIUM PHOSPHATE 3 MMOLE/ML IV SOLN
20.0000 mmol | Freq: Once | INTRAVENOUS | Status: AC
  Administered 2015-03-19: 20 mmol via INTRAVENOUS
  Filled 2015-03-19: qty 6.67

## 2015-03-19 MED ORDER — ACETAMINOPHEN 325 MG PO TABS
650.0000 mg | ORAL_TABLET | ORAL | Status: DC | PRN
  Administered 2015-03-22 – 2015-03-23 (×2): 650 mg via ORAL
  Filled 2015-03-19 (×2): qty 2

## 2015-03-19 MED ORDER — ALBUMIN HUMAN 25 % IV SOLN
INTRAVENOUS | Status: AC
  Filled 2015-03-19 (×3): qty 200

## 2015-03-19 MED ORDER — HYPROMELLOSE (GONIOSCOPIC) 2.5 % OP SOLN
1.0000 [drp] | Freq: Three times a day (TID) | OPHTHALMIC | Status: DC
  Administered 2015-03-19 – 2015-04-07 (×55): 1 [drp] via OPHTHALMIC
  Filled 2015-03-19: qty 15

## 2015-03-19 MED ORDER — DIPHENHYDRAMINE HCL 25 MG PO CAPS: 25.0000 mg | ORAL_CAPSULE | Freq: Four times a day (QID) | ORAL | Status: DC | PRN

## 2015-03-19 MED ORDER — POTASSIUM CHLORIDE 20 MEQ/15ML (10%) PO SOLN
30.0000 meq | ORAL | Status: AC
  Administered 2015-03-19 (×2): 30 meq
  Filled 2015-03-19 (×2): qty 30

## 2015-03-19 MED ORDER — SODIUM CHLORIDE 0.9 % IV SOLN
1.5000 g | Freq: Three times a day (TID) | INTRAVENOUS | Status: DC
  Administered 2015-03-19 (×2): 1.5 g via INTRAVENOUS
  Filled 2015-03-19 (×4): qty 1.5

## 2015-03-19 MED ORDER — ALBUTEROL SULFATE (2.5 MG/3ML) 0.083% IN NEBU
INHALATION_SOLUTION | RESPIRATORY_TRACT | Status: AC
Start: 2015-03-19 — End: 2015-03-20
  Filled 2015-03-19: qty 3

## 2015-03-19 MED ORDER — ALBUTEROL SULFATE (2.5 MG/3ML) 0.083% IN NEBU
2.5000 mg | INHALATION_SOLUTION | Freq: Four times a day (QID) | RESPIRATORY_TRACT | Status: DC | PRN
  Administered 2015-03-19 – 2015-03-26 (×7): 2.5 mg via RESPIRATORY_TRACT
  Filled 2015-03-19 (×7): qty 3

## 2015-03-19 NOTE — Progress Notes (Addendum)
NEURO HOSPITALIST PROGRESS NOTE   SUBJECTIVE:                                                                                                                        No new neurological developments. PLEX scheduled for today at 6 pm. On IV mestinon. Report of mucus plugging. NIF greater than -20 , VC 430 Phosphorus 1.9, being replaced. CxR: stable bibasilar changes.  OBJECTIVE:                                                                                                                           Vital signs in last 24 hours: Temp:  [96.7 F (35.9 C)-99.5 F (37.5 C)] 96.7 F (35.9 C) (10/07 1227) Pulse Rate:  [55-84] 69 (10/07 1240) Resp:  [12-25] 21 (10/07 1240) BP: (102-184)/(44-122) 111/54 mmHg (10/07 1240) SpO2:  [99 %-100 %] 100 % (10/07 1200) FiO2 (%):  [30 %-40 %] 30 % (10/07 1240) Weight:  [57.5 kg (126 lb 12.2 oz)] 57.5 kg (126 lb 12.2 oz) (10/07 0500)  Intake/Output from previous day: 10/06 0701 - 10/07 0700 In: 1122.8 [I.V.:622.8; NG/GT:500] Out: 1450 [Urine:1400; Emesis/NG output:50] Intake/Output this shift: Total I/O In: 210 [I.V.:30; NG/GT:180] Out: 85 [Urine:85] Nutritional status: Diet NPO time specified  Past Medical History  Diagnosis Date  . Hypertension   . Hypercholesteremia   . Stroke [facility])    Physical exam:  Constitutional: elderly female, intubated on the vent, in no apparent distress. Eyes: no jaundice or exophthalmos.  Head: normocephalic. Neck: supple, no bruits, no JVD. Cardiac: no murmurs. Lungs: clear. Abdomen: soft, no tender, no mass. Extremities: no edema, clubbing, or cyanosis.  Skin: no rash  Neurologic Exam:  General: NAD Mental Status: Intubated on the vent but alert and awake and following commands.  Cranial Nerves: II: Discs flat bilaterally; Visual fields grossly normal, pupils equal, round, reactive to light and accommodation III,IV, VI: mild left ptosis, extra-ocular motions  intact bilaterally V,VII: smile symmetric, facial light touch sensation normal bilaterally VIII: hearing normal bilaterally IX,X: intubated XI: bilateral shoulder shrug no tested XII: intubated  Motor: Moves all limbs symmetrically Sensory: Pinprick and light touch intact throughout, bilaterally Deep Tendon Reflexes:  1+ all over Plantars: Right: downgoingLeft: downgoing Cerebellar:  No tested Gait:  Unable to test due to multiple leads  Lab Results: Lab Results  Component Value Date/Time   CHOL 131 03/10/2015 08:35 PM   CHOL 154 12/17/2013 03:26 PM   Lipid Panel No results for input(s): CHOL, TRIG, HDL, CHOLHDL, VLDL, LDLCALC in the last 72 hours.  Studies/Results: Dg Chest Port 1 View  03/19/2015   CLINICAL DATA:  Check endotracheal tube placement  EXAM: PORTABLE CHEST - 1 VIEW  COMPARISON:  03/17/2015  FINDINGS: Cardiac shadow is stable. A left jugular line, feeding catheter and right jugular line are again seen and stable. The endotracheal tube is again noted approximately 1 cm above the carina. This could be withdrawn 1-2 cm as necessary. Bilateral pleural effusions are again identified with bibasilar infiltrative changes  IMPRESSION: Stable bibasilar changes.  Tubes and lines as described. The endotracheal tube could be removed 1-2 cm as necessary.   Electronically Signed   By: Alcide Clever M.D.   On: 03/19/2015 07:17   Dg Abd Portable 1v  03/17/2015   CLINICAL DATA:  Encounter for feeding tube placement  EXAM: PORTABLE ABDOMEN - 1 VIEW  COMPARISON:  Portable exam 1515 hours compared to 03/17/2015 at 0957 hours  FINDINGS: Tip of feeding tube projects over mid to distal stomach.  Nonobstructive bowel gas pattern.  No bowel dilatation or bowel wall thickening.  Suspected descending and sigmoid colonic diverticulosis.  Scattered atherosclerotic calcifications.  Bones demineralized with dextro convex thoracolumbar scoliosis.  Curvilinear densities  project over the RIGHT iliac bone question stool artifacts, injection granulomata and buttock, felt unlikely to be gallstones due to position.  IMPRESSION: Tip of feeding tube projects over mid to distal stomach.   Electronically Signed   By: Ulyses Southward M.D.   On: 03/17/2015 15:39    MEDICATIONS                                                                                                                        Scheduled: . acetylcysteine  4 mL Nebulization BID  . albuterol      . ampicillin-sulbactam (UNASYN) IV  1.5 g Intravenous Q8H  . antiseptic oral rinse  7 mL Mouth Rinse QID  . calcium gluconate IVPB  4 g Intravenous Once  . calcium gluconate  2 g Intravenous Once  . chlorhexidine gluconate  15 mL Mouth Rinse BID  . fentaNYL (SUBLIMAZE) injection  50 mcg Intravenous Once  . heparin  1,000 Units Intracatheter Once  . heparin  1,000 Units Intracatheter Once  . heparin  5,000 Units Subcutaneous 3 times per day  . pantoprazole (PROTONIX) IV  40 mg Intravenous QHS  . pyridostigmine  2 mg Intravenous 4 times per day    ASSESSMENT/PLAN:  81 y/o transferred to [facility] for further management of likely MG crisis with respiratory failure. Patient is intubated on the vent but alert, awake and able to follow commands. NIF greater than -20 , VC 430 Had IVIG x 1 dose at [facility]. Couldn't complete first dose PLEX on 10/5/ due to marked hypotension. Plan: PLEX scheduled at 6 pm today. Discontinue IV mestinon due to mucus plugging. Back to IVIG if unable to tolerate PLEX.  Cheyenne Portela, MD [facility] [phone]  03/19/2015, 1:08 PM

## 2015-03-19 NOTE — Progress Notes (Signed)
Pt CVC pulled with student with no problems. Tip of CVC looks like it had been cut before insertion as it had somewhat jagged edges. Sommers MD made aware and gave orders to have AM MD look at the central line. Line was placed in biohazard bag at head of bed.

## 2015-03-19 NOTE — Progress Notes (Signed)
ANTIBIOTIC CONSULT NOTE   Pharmacy Consult for Unasyn  Indication: Aspiration PNA  No Known Allergies  Patient Measurements: Height:  (157.5 cm) Weight: 126 lb 12.2 oz (57.5 kg) IBW/kg (Calculated) : 50.1   Vital Signs: Temp: 98.9 F (37.2 C) (10/07 0802) Temp Source: Axillary (10/07 0802) BP: 118/61 mmHg (10/07 0900) Pulse Rate: 64 (10/07 0900) Intake/Output from previous day: 10/06 0701 - 10/07 0700 In: 1122.8 [I.V.:622.8; NG/GT:500] Out: 1450 [Urine:1400; Emesis/NG output:50] Intake/Output from this shift: Total I/O In: 210 [I.V.:30; NG/GT:180] Out: 85 [Urine:85]  Labs:  Recent Labs  03/17/15 0438 03/17/15 2051 03/18/15 0343 03/19/15 0340  WBC 6.8  --  8.3 6.6  HGB 10.1* 10.2* 9.9* 9.0*  PLT 201  --  207 174  CREATININE 0.99 0.90 0.92 0.81   Estimated Creatinine Clearance: 37.2 mL/min (by C-G formula based on Cr of 0.81). No results for input(s): VANCOTROUGH, VANCOPEAK, VANCORANDOM, GENTTROUGH, GENTPEAK, GENTRANDOM, TOBRATROUGH, TOBRAPEAK, TOBRARND, AMIKACINPEAK, AMIKACINTROU, AMIKACIN in the last 72 hours.   Microbiology: Recent Results (from the past 720 hour(s))  Culture, blood (routine x 2)     Status: None   Collection Time: 03/11/15  1:23 PM  Result Value Ref Range Status   Specimen Description BLOOD RIGHT ARM  Final   Special Requests BOTTLES DRAWN AEROBIC AND ANAEROBIC 2CC  Final   Culture NO GROWTH 6 DAYS  Final   Report Status 03/17/2015 FINAL  Final  Culture, blood (routine x 2)     Status: None   Collection Time: 03/11/15  1:23 PM  Result Value Ref Range Status   Specimen Description BLOOD LEFT ASSIST CONTROL  Final   Special Requests   Final    BOTTLES DRAWN AEROBIC AND ANAEROBIC 4CC ANAERO 7CC AERO   Culture NO GROWTH 6 DAYS  Final   Report Status 03/17/2015 FINAL  Final  MRSA PCR Screening     Status: None   Collection Time: 03/11/15  2:00 PM  Result Value Ref Range Status   MRSA by PCR  NEGATIVE Final    RESULTS INVALID.  NOTIFIED JAMIE SMITH GREGORY AT 1730 FOR RECOLLECT MSS  Culture, bal-quantitative     Status: None   Collection Time: 03/11/15  6:45 PM  Result Value Ref Range Status   Specimen Description BRONCHIAL ALVEOLAR LAVAGE  Final   Special Requests Normal  Final   Gram Stain   Final    FEW WBC SEEN MODERATE GRAM POSITIVE COCCI FEW GRAM NEGATIVE RODS GOOD SPECIMEN - 80-90% WBCS    Culture LIGHT GROWTH HAEMOPHILUS PARAHAEMOLYTICUS  Final   Report Status 03/15/2015 FINAL  Final  MRSA PCR Screening     Status: None   Collection Time: 03/15/15 10:56 PM  Result Value Ref Range Status   MRSA by PCR NEGATIVE NEGATIVE Final    Comment:        The GeneXpert MRSA Assay (FDA approved for NASAL specimens only), is one component of a comprehensive MRSA colonization surveillance program. It is not intended to diagnose MRSA infection nor to guide or monitor treatment for MRSA infections.     Medical History: Past Medical History  Diagnosis Date  . Hypertension   . Hypercholesteremia   . Stroke [facility])    Assessment: 81 y/o tx from [facility], has been on Vanco/Zosyn at [facility] then switched to Unasyn for aspiration PNA which was sopped on 10/5. Now restarting Unasyn therapy due to worsening chest x ray. WBC wnl, Pt is afebrile. CrCl ~ 35-40 mL/min  Plan:  -Restart  Unasyn 1.5g IV q12h -F/U infectious work-up  Vinnie Level, PharmD., BCPS Clinical Pharmacist Pager [phone]

## 2015-03-19 NOTE — Progress Notes (Signed)
RT retracted pt ETT 1cm per MD order. Patient tolerated well. Vital signs stable throughout. No complications. RT will continue to monitor.

## 2015-03-19 NOTE — Progress Notes (Addendum)
eLink Physician-Brief Progress Note Patient Name: Cheyenne Boone DOB: [birth year] MRN: [id number]   Date of Service  03/19/2015  HPI/Events of Note  Patient requests home eye drops restarted.   eICU Interventions  Will order: 1. Artificial tears to both eyes TID 2. Xalantan eye drops 1 drop to R eye Q HS. Note that home meds state that she gets this medication in the L eye. Will have nurse ask the family bring in medication to verify that we are giving it in the correct eye.      Intervention Category Minor Interventions: Routine modifications to care plan (e.g. PRN medications for pain, fever)  Sommer,Steven Eugene 03/19/2015, 5:57 PM

## 2015-03-19 NOTE — Progress Notes (Signed)
eLink Physician-Brief Progress Note Patient Name: Cheyenne Boone DOB: February 24, 1924 MRN: [id number]   Date of Service  03/19/2015  HPI/Events of Note  Oliguria. Last CVP = 5.0.   eICU Interventions  Will bolus with 0.9 NaCl 1 liter IV over 1 hour now.      Intervention Category Intermediate Interventions: Oliguria - evaluation and management  [name] 03/19/2015, 4:34 PM

## 2015-03-19 NOTE — Progress Notes (Signed)
NIF greater than -20  VC 430

## 2015-03-19 NOTE — Progress Notes (Signed)
Pt family member stated that her glaucoma eye drops were for her Right eye and not her Left as written in the home medication list. Dr. Arsenio Loader ordered for glaucoma eye drops for the right eye but this needs to be clarified with pharmacy before administration. I have asked family to bring in the prescription tomorrow afternoon.

## 2015-03-19 NOTE — Progress Notes (Signed)
Nutrition Follow-up  DOCUMENTATION CODES:   Severe malnutrition in context of chronic illness  INTERVENTION:   Continue TF via enteral feeding tube, change to Vital AF 1.2 at goal rate of 40 ml/h (960 ml per day) to provide 1152 kcals, 72 gm protein, 779 ml free water daily.  Continue to monitor magnesium, potassium, and phosphorus daily for at least 3 days, MD to replete as needed, as pt is at risk for refeeding syndrome given minimal intake x 10 days and severe PCM.  RD team to continue to monitor for tube feeding tolerance.    NUTRITION DIAGNOSIS:   Malnutrition related to chronic illness, dysphagia as evidenced by severe depletion of muscle mass, percent weight loss.  Ongoing.   GOAL:   Patient will meet greater than or equal to 90% of their needs  Met.   MONITOR:   Vent status, Labs, Weight trends, TF tolerance, I & O's, Diet advancement, Skin  REASON FOR ASSESSMENT:   Consult, Ventilator Enteral/tube feeding initiation and management  ASSESSMENT:   81 year old female with 10 day history of dysphagia. Was admitted to [facility] for what was suspected to be myasthenia gravis flare. She was started on IVIG and mestinon, however eventually required intubation. Transferred to [facility] request and for possible PLEX.   Patient alert and energetic during visit for tube feeding assessment. Per nurse report,  tube feeding Vital 1.2 was initialed at 25 mL/hr and progressed to goal rate of 40 mL/hr following unit protocol.     Labs reviewed - Mg (1.5-1.7), low P (1.3-7.2), low K (3.2), low Ca (8.3), CBG (67-108).   NFPE results - mild-moderate fat wasting; severe muscle wasting - clavicle, acromion / shoulder, temple, orbital; mild edema.  Patient is currently intubated on ventilator support.  MV: 5.8 L/min Temp (24hrs), Avg:98.8 F (37.1 C), Min:98 F (36.7 C), Max:99.5 F (37.5 C)  Medications reviewed.   Diet Order:  Diet NPO time specified  Skin:  Reviewed, no  issues  Last BM:  unknown  Height:   Ht Readings from Last 1 Encounters:  03/15/15 _0  (1.575 m)    Weight:   Wt Readings from Last 1 Encounters:  03/19/15 126 lb 12.2 oz (57.5 kg)    Ideal Body Weight:  50 kg  BMI:  Body mass index is 23.18 kg/(m^2).  Estimated Nutritional Needs:   Kcal:  1093  Protein:  75-85 gm  Fluid:  1.5 L  EDUCATION NEEDS:   No education needs identified at this time  Kayleen Memos, [place]. BS Dietetic Intern 03/19/2015 11:54 AM   I agree with student dietitian note; appropriate revisions have been made.  Molli Barrows, RD, LDN, [place] Pager# [phone] After Hours Pager# [phone]

## 2015-03-19 NOTE — Progress Notes (Signed)
[facility] ADULT ICU REPLACEMENT PROTOCOL FOR AM LAB REPLACEMENT ONLY  The patient does apply for the [facility] Adult ICU Electrolyte Replacment Protocol based on the criteria listed below:   1. Is GFR >/= 40 ml/min? Yes.    Patient's GFR today is >60 2. Is urine output >/= 0.5 ml/kg/hr for the last 6 hours? Yes.   Patient's UOP is 0.9 ml/kg/hr 3. Is BUN < 60 mg/dL? Yes.    Patient's BUN today is 6 4. Abnormal electrolyte(s): K3.2,Phos1.9,Mg1.7 5. Ordered repletion with:Per protocol 6. If a panic level lab has been reported, has the CCM MD in charge been notified? Yes.  .   Physician:  Sood,MD  Cheyenne Boone 03/19/2015 5:52 AM

## 2015-03-19 NOTE — Progress Notes (Signed)
PULMONARY / CRITICAL CARE MEDICINE   Name: Cheyenne Boone MRN: [id number] DOB: 21-Jan-1924    ADMISSION DATE:  03/15/2015 CONSULTATION DATE:  03/15/2015  REFERRING MD :  Jamison Neighbor  CHIEF COMPLAINT:  Dysphagia  INITIAL PRESENTATION: 81 year old female with 10 day history of dysphagia. Was admitted to [facility] for what was suspected to be myasthenia gravis flare. She was started on on IVIG and mestinon, however eventually required intubation. Transferred to [facility] at family request and for possible PLEX. PCCM to admit.   STUDIES:  9/27 CT head - Stable noncontrast CT appearance of the brain since May. No acute intracranial abnormality  9/27 Barium swallow - Nondiagnostic barium swallow examination due to the patient's inability to more than tiny amounts of the barium, Possible mass in the AP window. Possible infrahilar pneumonia on the left.  9/27 CT chest - Significant coronary artery disease, mitral annulus and aortic valve disease. Tortuous aorta without aneurysm. Large hiatal hernia. No associated obstruction.  9/27 CT soft tissue neck - Moderate calcified plaque is noted in proximal right internal carotid artery; carotid ultrasound is recommended for further evaluation. Fluid is noted in the right mastoid air cells. Clinical correlation is recommended to rule out mastoiditis.  9/28 MRI brain - No acute intracranial abnormality. Chronic small vessel ischemic disease is stable since 2015. Chronic cervical spine degeneration with C3-C4 and C4-C5 spinal stenosis which does not appear significantly progressed since 2015.  9/28 Carotid doppler - Right carotid bifurcation and proximal ICA plaque, resulting in less than 50% diameter stenosis. The exam does not exclude plaque ulceration or embolization.  SIGNIFICANT EVENTS: 9/27 - Admit for dysphagia  9/29 - Transfer to ICU @ [facility] for respiratory failure & intubated>>Bronchoscopy with bilateral secretions & plugging 10/01 - Extubated 10/03 -  Reintubated 10/03 - Transfer to [facility] from [facility] 10/4- HD cath placed 10/5 PLEX planned, EGD for OGT placement. PLEX stopped due to hypotension  SUBJECTIVE:  Awake, intubated, follows some commands  VITAL SIGNS: Temp:  [98 F (36.7 C)-99.5 F (37.5 C)] 98.9 F (37.2 C) (10/07 0802) Pulse Rate:  [28-113] 80 (10/07 0801) Resp:  [12-28] 17 (10/07 0801) BP: (105-184)/(46-122) 114/59 mmHg (10/07 0801) SpO2:  [84 %-100 %] 99 % (10/07 0801) FiO2 (%):  [30 %-40 %] 30 % (10/07 0801) Weight:  [126 lb 12.2 oz (57.5 kg)] 126 lb 12.2 oz (57.5 kg) (10/07 0500) HEMODYNAMICS: CVP:  [4 mmHg-8 mmHg] 6 mmHg VENTILATOR SETTINGS: Vent Mode:  [-] PRVC FiO2 (%):  [30 %-40 %] 30 % Set Rate:  [12 bmp] 12 bmp Vt Set:  [400 mL] 400 mL PEEP:  [5 cmH20] 5 cmH20 Plateau Pressure:  [13 cmH20-21 cmH20] 13 cmH20 INTAKE / OUTPUT:  Intake/Output Summary (Last 24 hours) at 03/19/15 0803 Last data filed at 03/19/15 0600  Gross per 24 hour  Intake 972.79 ml  Output   1350 ml  Net -377.21 ml    PHYSICAL EXAMINATION: General: Elderly female on vent, weak  Neuro: Alert, follows commands HEENT: [place]/AT, PERRL Cardiovascular: s1 s2 RRR Lungs: Diminished but clear Abdomen: Soft, non-tender, non-distended Musculoskeletal: No acute deformity or ROM limitation Skin: Grossly intact  LABS:  CBC  Recent Labs Lab 03/17/15 0438 03/17/15 2051 03/18/15 0343 03/19/15 0340  WBC 6.8  --  8.3 6.6  HGB 10.1* 10.2* 9.9* 9.0*  HCT 31.2* 30.0* 30.1* 26.5*  PLT 201  --  207 174   Coag's  Recent Labs Lab 03/15/15 2245  APTT 30  INR 1.02  BMET  Recent Labs Lab 03/17/15 0438 03/17/15 2051 03/18/15 0343 03/19/15 0340  NA 138 142 142 138  K 3.5 3.4* 3.2* 3.2*  CL 109 107 112* 107  CO2 23  --  22 24  BUN CREATININE 0.99 0.90 0.92 0.81  GLUCOSE 94 149* 123* 111*   Electrolytes  Recent Labs Lab 03/16/15 0444 03/17/15 0438 03/18/15 0343 03/18/15 1808 03/19/15 0340   CALCIUM 8.0* 8.0* 8.4*  --  8.3*  MG 1.5*  --   --  1.6* 1.7  PHOS 5.2*  --   --  1.3* 1.9*   Sepsis Markers  Recent Labs Lab 03/13/15 0422 03/15/15 2245  LATICACIDVEN  --  1.4  PROCALCITON 0.63 0.38   ABG  Recent Labs Lab 03/14/15 0920 03/16/15 0321 03/18/15 0425  PHART 7.46* 7.449 7.398  PCO2ART 37 36.3 33.9*  PO2ART 50* 107.0* 150*   Liver Enzymes  Recent Labs Lab 03/15/15 2245 03/17/15 0438  AST 41 23  ALT 26 20  ALKPHOS 94 75  BILITOT 1.3* 1.0  ALBUMIN 2.7* 2.2*   Cardiac Enzymes  Recent Labs Lab 03/15/15 2245 03/16/15 0444 03/16/15 1105  TROPONINI <0.03 <0.03 <0.03   Glucose  Recent Labs Lab 03/18/15 1522 03/18/15 1605 03/18/15 1923 03/18/15 2214 03/19/15 0003 03/19/15 0340  GLUCAP 62* 106* 67 96 97 108*    Imaging Dg Chest Port 1 View  03/19/2015   CLINICAL DATA:  Check endotracheal tube placement  EXAM: PORTABLE CHEST - 1 VIEW  COMPARISON:  03/17/2015  FINDINGS: Cardiac shadow is stable. A left jugular line, feeding catheter and right jugular line are again seen and stable. The endotracheal tube is again noted approximately 1 cm above the carina. This could be withdrawn 1-2 cm as necessary. Bilateral pleural effusions are again identified with bibasilar infiltrative changes  IMPRESSION: Stable bibasilar changes.  Tubes and lines as described. The endotracheal tube could be removed 1-2 cm as necessary.   Electronically Signed   By: Alcide Clever M.D.   On: 03/19/2015 07:17     ASSESSMENT / PLAN:  PULMONARY OETT 9/29 > 10/1, 10/3 >>> A: Acute respiratory failure in setting myasthenia gravis flare Mucus plugging - S/P bronch & BAL 9/29 pcxr 10/7 worsening atx rt base  P:  noted pcxr, peep increase to 8, add chest pt  Need PLEX this am , have called neuro and HD unit pcxr in am  Add mucomysts Adjust ett out 1 cm  CARDIOVASCULAR CVL LIJ 10/3 > A:  H/O HTN H/O HLD Drop in BP with one of plex treatment P:  Monitor on  telemetry BP monitor with plex If BP drops plex, add pressors  RENAL A:  Acute Renal Failure  Hypokalemia - resolved Hyporphosphatemia hypomag  P:  Mag, k, phos supp Repeat lytes in am  kvo Assess BP after plex, then consider lasix  GASTROINTESTINAL A:  Large hiatal hernia Dysphagia Difficult NGT placement P:  Protonix IV daily for prophylaxis start feed to goal  HEMATOLOGIC A: DVT prevention P:  Heparin for VTE prophylaxis SCDs  INFECTIOUS A:  Leukocytosis in setting oral steroids Aspiration PNA - H parahaemolytics on BAL  P:  BCx2 9/29 >>> No growth BAL 9/29 - H parahemolyticus UC - neg  Abx:  Unasyn, start date 10/04>>>allow to dc today 6th Zosyn, start date 9/29 >>>10/04 Vancomycin, start date 9/29 >>>10/04  Last day of antibiotics 10/6 pcxr worsened atx, extend unasyn  ENDOCRINE A:  Hyperglycemia  P:  Accuchecks q4hr Low glu at times, dc ssi Follow trend with T F  NEUROLOGIC A:  Myasthenia gravis crisis P:  RASS goal: 0 Fentanyl infusion Continue Mestinon  IV q6hr Holding Solu-Medrol HD catheter placed for PLEX, today repeat, have called for this [facility] Concern still need plex with pcxr , secretions etc  FAMILY  - Updates: No family at bedside to update. Tried to call daughter  - Inter-disciplinary family meet or Palliative Care meeting due by: 10/6   Katina Degree. Jimmey Ralph, MD [facility] Family Medicine Resident PGY-2 03/19/2015 8:03 AM    STAFF NOTE: Cindi Carbon, MD FACP have personally reviewed patient's available data, including medical history, events of note, physical examination and test results as part of my evaluation. I have discussed with resident/NP and other care providers such as pharmacist, RN and RRT. In addition, I personally evaluated patient and elicited key findings of: awakens, pcxr is worse rt base, add mucomysts, add chest pt, adjust ett, peep to 8, pcxr in am , plex needed today,  if drops BP support for full treatment with pressors, extend unasyn, tf to goal, lasix likely if BP tolerates plex The patient is critically ill with multiple organ systems failure and requires high complexity decision making for assessment and support, frequent evaluation and titration of therapies, application of advanced monitoring technologies and extensive interpretation of multiple databases.   Critical Care Time devoted to patient care services described in this note is 30 Minutes. This time reflects time of care of this signee: Rory Percy, MD FACP. This critical care time does not reflect procedure time, or teaching time or supervisory time of PA/NP/Med student/Med Resident etc but could involve care discussion time. Rest per NP/medical resident whose note is outlined above and that I agree with   [name]. Tyson Alias, MD, FACP Pgr: [phone] [place] [facility] 03/19/2015 9:31 AM  \]

## 2015-03-19 NOTE — Progress Notes (Signed)
NIF greater than -20, VC 500 ml.

## 2015-03-20 ENCOUNTER — Inpatient Hospital Stay (HOSPITAL_COMMUNITY): Payer: Commercial Managed Care - HMO

## 2015-03-20 DIAGNOSIS — Z87898 Personal history of other specified conditions: Secondary | ICD-10-CM

## 2015-03-20 DIAGNOSIS — Z4659 Encounter for fitting and adjustment of other gastrointestinal appliance and device: Secondary | ICD-10-CM | POA: Diagnosis present

## 2015-03-20 DIAGNOSIS — J9601 Acute respiratory failure with hypoxia: Secondary | ICD-10-CM

## 2015-03-20 LAB — BASIC METABOLIC PANEL
ANION GAP: 5 (ref 5–15)
Anion gap: 5 (ref 5–15)
BUN: 13 mg/dL (ref 6–20)
BUN: 16 mg/dL (ref 6–20)
CO2: 22 mmol/L (ref 22–32)
CO2: 28 mmol/L (ref 22–32)
CREATININE: 0.7 mg/dL (ref 0.44–1.00)
Calcium: 8 mg/dL — ABNORMAL LOW (ref 8.9–10.3)
Calcium: 12.5 mg/dL — ABNORMAL HIGH (ref 8.9–10.3)
Chloride: 114 mmol/L — ABNORMAL HIGH (ref 101–111)
Chloride: 104 mmol/L (ref 101–111)
Creatinine, Ser: 0.99 mg/dL (ref 0.44–1.00)
GFR calc Af Amer: >60 mL/min (ref >60)
GFR calc Af Amer: 57 mL/min — ABNORMAL LOW (ref >60)
GFR calc non Af Amer: 49 mL/min — ABNORMAL LOW (ref >60)
GLUCOSE: 142 mg/dL — AB (ref 65–99)
GLUCOSE: 330 mg/dL — AB (ref 65–99)
POTASSIUM: 3.4 mmol/L — AB (ref 3.5–5.1)
Potassium: 3.7 mmol/L (ref 3.5–5.1)
SODIUM: 141 mmol/L (ref 135–145)
Sodium: 137 mmol/L (ref 135–145)

## 2015-03-20 LAB — CBC
HCT: 27.6 % — ABNORMAL LOW (ref 36.0–46.0)
Hemoglobin: 9.2 g/dL — ABNORMAL LOW (ref 12.0–15.0)
MCH: 31.1 pg (ref 26.0–34.0)
MCHC: 33.3 g/dL (ref 30.0–36.0)
MCV: 93.2 fL (ref 78.0–100.0)
PLATELETS: 162 10*3/uL (ref 150–400)
RBC: 2.96 MIL/uL — ABNORMAL LOW (ref 3.87–5.11)
RDW: 14.7 % (ref 11.5–15.5)
WBC: 9.8 10*3/uL (ref 4.0–10.5)

## 2015-03-20 LAB — POCT I-STAT 3, VENOUS BLOOD GAS (G3P V)
ACID-BASE EXCESS: 4 mmol/L — AB (ref 0.0–2.0)
BICARBONATE: 28.7 meq/L — AB (ref 20.0–24.0)
O2 SAT: 81 %
PO2 VEN: 44 mmHg (ref 30.0–45.0)
Patient temperature: 98.6
TCO2: 30 mmol/L (ref 0–100)
pCO2, Ven: 41.8 mmHg — ABNORMAL LOW (ref 45.0–50.0)
pH, Ven: 7.443 — ABNORMAL HIGH (ref 7.250–7.300)

## 2015-03-20 LAB — GLUCOSE, CAPILLARY
GLUCOSE-CAPILLARY: 166 mg/dL — AB (ref 65–99)
Glucose-Capillary: 191 mg/dL — ABNORMAL HIGH (ref 65–99)
Glucose-Capillary: 165 mg/dL — ABNORMAL HIGH (ref 65–99)
Glucose-Capillary: 139 mg/dL — ABNORMAL HIGH (ref 65–99)

## 2015-03-20 LAB — TROPONIN I: Troponin I: <0.03 ng/mL (ref <0.031)

## 2015-03-20 LAB — MAGNESIUM
MAGNESIUM: 1.6 mg/dL — AB (ref 1.7–2.4)
Magnesium: 2.8 mg/dL — ABNORMAL HIGH (ref 1.7–2.4)
Magnesium: 2.6 mg/dL — ABNORMAL HIGH (ref 1.7–2.4)

## 2015-03-20 LAB — PHOSPHORUS
PHOSPHORUS: 2.9 mg/dL (ref 2.5–4.6)
PHOSPHORUS: 2.8 mg/dL (ref 2.5–4.6)

## 2015-03-20 MED ORDER — AMIODARONE HCL IN DEXTROSE 360-4.14 MG/200ML-% IV SOLN
INTRAVENOUS | Status: AC
  Filled 2015-03-20: qty 200

## 2015-03-20 MED ORDER — HYDROXYZINE HCL 10 MG/5ML PO SYRP
10.0000 mg | ORAL_SOLUTION | Freq: Three times a day (TID) | ORAL | Status: DC | PRN
  Administered 2015-03-20: 10 mg
  Filled 2015-03-20 (×2): qty 5

## 2015-03-20 MED ORDER — NOREPINEPHRINE BITARTRATE 1 MG/ML IV SOLN
0.0000 ug/min | INTRAVENOUS | Status: AC
  Administered 2015-03-20: 10 ug/min via INTRAVENOUS
  Filled 2015-03-20: qty 4

## 2015-03-20 MED ORDER — SODIUM BICARBONATE 4.2 % IV SOLN: 50.0000 meq | Freq: Once | INTRAVENOUS | Status: DC

## 2015-03-20 MED ORDER — MAGNESIUM SULFATE 4 GM/100ML IV SOLN
4.0000 g | Freq: Once | INTRAVENOUS | Status: AC
  Administered 2015-03-20: 4 g via INTRAVENOUS
  Filled 2015-03-20: qty 100

## 2015-03-20 MED ORDER — EPINEPHRINE HCL 1 MG/ML IJ SOLN: 1.0000 mg | Freq: Once | INTRAMUSCULAR | Status: AC

## 2015-03-20 MED ORDER — EPINEPHRINE HCL 1 MG/ML IJ SOLN
1.0000 mg | Freq: Once | INTRAMUSCULAR | Status: AC
  Administered 2015-03-20: 1 mg via INTRAVENOUS

## 2015-03-20 MED ORDER — DEXMEDETOMIDINE HCL IN NACL 200 MCG/50ML IV SOLN
0.4000 ug/kg/h | INTRAVENOUS | Status: DC
  Administered 2015-03-20: 0.4 ug/kg/h via INTRAVENOUS
  Filled 2015-03-20: qty 50

## 2015-03-20 MED ORDER — AMIODARONE HCL IN DEXTROSE 360-4.14 MG/200ML-% IV SOLN
30.0000 mg/h | INTRAVENOUS | Status: DC
  Filled 2015-03-20 (×4): qty 200

## 2015-03-20 MED ORDER — MAGNESIUM SULFATE 50 % IJ SOLN
4.0000 g | Freq: Once | INTRAVENOUS | Status: DC
  Filled 2015-03-20: qty 8

## 2015-03-20 MED ORDER — CALCIUM CHLORIDE 10 % IV SOLN
1.0000 g | Freq: Once | INTRAVENOUS | Status: AC
  Administered 2015-03-20: 1 g via INTRAVENOUS

## 2015-03-20 MED ORDER — SODIUM BICARBONATE 8.4 % IV SOLN
50.0000 meq | Freq: Once | INTRAVENOUS | Status: AC
  Administered 2015-03-20: 50 meq via INTRAVENOUS

## 2015-03-20 MED ORDER — ONDANSETRON HCL 4 MG/2ML IJ SOLN
INTRAMUSCULAR | Status: AC
  Filled 2015-03-20: qty 2

## 2015-03-20 MED ORDER — CAMPHOR-MENTHOL 0.5-0.5 % EX LOTN
TOPICAL_LOTION | CUTANEOUS | Status: DC | PRN
  Filled 2015-03-20: qty 222

## 2015-03-20 MED ORDER — SODIUM CHLORIDE 0.9 % IV SOLN
1.0000 g | Freq: Two times a day (BID) | INTRAVENOUS | Status: AC
  Administered 2015-03-20 – 2015-03-24 (×11): 1 g via INTRAVENOUS
  Filled 2015-03-20 (×13): qty 1

## 2015-03-20 MED ORDER — METHYLPREDNISOLONE SODIUM SUCC 125 MG IJ SOLR
80.0000 mg | Freq: Once | INTRAMUSCULAR | Status: AC
  Administered 2015-03-20: 80 mg via INTRAVENOUS
  Filled 2015-03-20: qty 2

## 2015-03-20 MED ORDER — AMIODARONE HCL IN DEXTROSE 360-4.14 MG/200ML-% IV SOLN: 60.0000 mg/h | INTRAVENOUS | Status: DC

## 2015-03-20 MED ORDER — FUROSEMIDE 10 MG/ML IJ SOLN
20.0000 mg | Freq: Two times a day (BID) | INTRAMUSCULAR | Status: AC
  Administered 2015-03-20 – 2015-03-21 (×2): 20 mg via INTRAVENOUS
  Filled 2015-03-20 (×3): qty 2

## 2015-03-20 MED ORDER — AMIODARONE LOAD VIA INFUSION
150.0000 mg | Freq: Once | INTRAVENOUS | Status: DC
  Filled 2015-03-20: qty 83.34

## 2015-03-20 NOTE — Progress Notes (Signed)
eLink Physician-Brief Progress Note Patient Name: ANIVEA VELASQUES DOB: [birth year] MRN: [id number]   Date of Service  03/20/2015  HPI/Events of Note  Continued issues with itching that is creating agitation in patient.  Benadryl does not appear to help.  ?Drug reaction but no rash.  eICU Interventions  Plan: Will add topical SARNA lotion Atarax PRN added - use in place of benadryl     Intervention Category Intermediate Interventions: Other:  DETERDING,ELIZABETH 03/20/2015, 12:47 AM

## 2015-03-20 NOTE — Progress Notes (Addendum)
PULMONARY / CRITICAL CARE MEDICINE   Name: Cheyenne Boone MRN: [id number] DOB: [birth year]    ADMISSION DATE:  03/15/2015 CONSULTATION DATE:  03/15/2015  REFERRING MD :  Jamison Neighbor  CHIEF COMPLAINT:  Dysphagia  INITIAL PRESENTATION: 81 year old female with 10 day history of dysphagia. Was admitted to [facility] for what was suspected to be myasthenia gravis flare. She was started on on IVIG and mestinon, however eventually required intubation. Transferred to [facility] at family request and for possible PLEX. PCCM to admit.   STUDIES:  9/27 CT head - Stable noncontrast CT appearance of the brain since May. No acute intracranial abnormality  9/27 Barium swallow - Nondiagnostic barium swallow examination due to the patient's inability to more than tiny amounts of the barium, Possible mass in the AP window. Possible infrahilar pneumonia on the left.  9/27 CT chest - Significant coronary artery disease, mitral annulus and aortic valve disease. Tortuous aorta without aneurysm. Large hiatal hernia. No associated obstruction.  9/27 CT soft tissue neck - Moderate calcified plaque is noted in proximal right internal carotid artery; carotid ultrasound is recommended for further evaluation. Fluid is noted in the right mastoid air cells. Clinical correlation is recommended to rule out mastoiditis.  9/28 MRI brain - No acute intracranial abnormality. Chronic small vessel ischemic disease is stable since 2015. Chronic cervical spine degeneration with C3-C4 and C4-C5 spinal stenosis which does not appear significantly progressed since 2015.  9/28 Carotid doppler - Right carotid bifurcation and proximal ICA plaque, resulting in less than 50% diameter stenosis. The exam does not exclude plaque ulceration or embolization.  SIGNIFICANT EVENTS: 9/27 - Admit for dysphagia  9/29 - Transfer to ICU @ [facility] for respiratory failure & intubated>>Bronchoscopy with bilateral secretions & plugging 10/01 - Extubated 10/03 -  Reintubated 10/03 - Transfer to [facility] from [facility] 10/4- HD cath placed 10/5 PLEX planned, EGD for OGT placement. PLEX stopped due to hypotension 10/7- concern for allergic rxn to UNasyn or plex citrate, improved with treatment  SUBJECTIVE: swelling face resolved   VITAL SIGNS: Temp:  [96.7 F (35.9 C)-99.7 F (37.6 C)] 97.7 F (36.5 C) (10/08 0816) Pulse Rate:  [55-117] 89 (10/08 0904) Resp:  [7-27] 22 (10/08 0904) BP: (84-164)/(41-115) 107/59 mmHg (10/08 0904) SpO2:  [85 %-100 %] 100 % (10/08 0904) FiO2 (%):  [30 %-40 %] 30 % (10/08 0904) Weight:  [57.5 kg (126 lb 12.2 oz)] 57.5 kg (126 lb 12.2 oz) (10/08 0500) HEMODYNAMICS: CVP:  [5 mmHg] 5 mmHg VENTILATOR SETTINGS: Vent Mode:  [-] PRVC FiO2 (%):  [30 %-40 %] 30 % Set Rate:  [12 bmp] 12 bmp Vt Set:  [400 mL] 400 mL PEEP:  [8 cmH20] 8 cmH20 Plateau Pressure:  [18 cmH20-22 cmH20] 22 cmH20 INTAKE / OUTPUT:  Intake/Output Summary (Last 24 hours) at 03/20/15 0926 Last data filed at 03/20/15 0757  Gross per 24 hour  Intake 1651.42 ml  Output    760 ml  Net 891.42 ml    PHYSICAL EXAMINATION: General: Elderly female on vent, weak  Neuro: Alert, follows commands HEENT: tongue no swelling, face no swelling Cardiovascular: s1 s2 RRR Lungs: CTA Abdomen: Soft, non-tender, non-distended Musculoskeletal: No acute deformity or ROM limitation Skin: Grossly intact  LABS:  CBC  Recent Labs Lab 03/18/15 0343 03/19/15 0340 03/19/15 2344 03/20/15 0340  WBC 8.3 6.6  --  9.8  HGB 9.9* 9.0* 8.5* 9.2*  HCT 30.1* 26.5* 25.0* 27.6*  PLT 207 174  --  162   Coag's  Recent Labs Lab 03/15/15 2245  APTT 30  INR 1.02   BMET  Recent Labs Lab 03/18/15 0343 03/19/15 0340 03/19/15 2344 03/20/15 0340  NA 142 138 142 141  K 3.2* 3.2* 3.8 3.7  CL 112* 107 106 114*  CO2 22 24  --  22  BUN CREATININE 0.92 0.81 0.80 0.70  GLUCOSE 123* 111* 101* 142*   Electrolytes  Recent Labs Lab  03/18/15 0343  03/19/15 0340 03/19/15 1629 03/20/15 0340  CALCIUM 8.4*  --  8.3*  --  8.0*  MG  --   < > 1.7 2.1 1.6*  PHOS  --   < > 1.9* 3.2 2.9  < > = values in this interval not displayed. Sepsis Markers  Recent Labs Lab 03/15/15 2245  LATICACIDVEN 1.4  PROCALCITON 0.38   ABG  Recent Labs Lab 03/14/15 0920 03/16/15 0321 03/18/15 0425  PHART 7.46* 7.449 7.398  PCO2ART 37 36.3 33.9*  PO2ART 50* 107.0* 150*   Liver Enzymes  Recent Labs Lab 03/15/15 2245 03/17/15 0438  AST 41 23  ALT 26 20  ALKPHOS 94 75  BILITOT 1.3* 1.0  ALBUMIN 2.7* 2.2*   Cardiac Enzymes  Recent Labs Lab 03/15/15 2245 03/16/15 0444 03/16/15 1105  TROPONINI <0.03 <0.03 <0.03   Glucose  Recent Labs Lab 03/18/15 1605 03/18/15 1923 03/18/15 2214 03/19/15 0003 03/19/15 0340 03/20/15 0819  GLUCAP 106* 67 96 97 108* 166*    Imaging Dg Chest Port 1 View  03/20/2015   CLINICAL DATA:  Acute respiratory failure.  EXAM: PORTABLE CHEST 1 VIEW  COMPARISON:  March 19, 2015.  FINDINGS: Stable cardiomegaly. Endotracheal tube is in grossly good position with distal tip 2.5 cm above the carina. Right internal jugular dialysis catheter is noted with distal tip in expected position of the SVC. Feeding tube is seen entering the stomach. No pneumothorax is noted. Bilateral pleural effusions are noted with left greater than right. Bony thorax is intact.  IMPRESSION: Endotracheal tube in grossly good position. Stable bilateral pleural effusions are noted.   Electronically Signed   By: Lupita Raider, M.D.   On: 03/20/2015 08:32     ASSESSMENT / PLAN:  PULMONARY OETT 9/29 > 10/1, 10/3 >>> A: Acute respiratory failure in setting myasthenia gravis flare Mucus plugging - S/P bronch & BAL 9/29 pcxr 10/7 worsening atx rt base, improved 10/8  P:  Maintain chest pt, mucomysts x 2 more doses as pcx improved Keep peep 8 till am Repeat pcxr Wean today cpap 8 ps 5-10 plex Sunday NIF / VC wil  need leak prior to extubation with new reaction concerns  CARDIOVASCULAR CVL LIJ 10/3 > A:  H/O HTN H/O HLD Drop in BP with one of plex treatment P:  Monitor on telemetry Tolerated plex  RENAL A:  Acute Renal Failure  Hypokalemia - resolved hypomag  P:  Pos daily balance, consider lasix Mag supp 1/2 NS avoid saline  GASTROINTESTINAL A:  Large hiatal hernia Dysphagia Difficult NGT placement P:  Protonix IV daily for prophylaxis start feed to goal, tolerating  HEMATOLOGIC A: DVT prevention P:  Heparin for VTE prophylaxis SCDs  INFECTIOUS A:  Leukocytosis in setting oral steroids Aspiration PNA - H parahaemolytics on BAL Unasyn concern allergy likley citrate reaction P:  BCx2 9/29 >>> No growth BAL 9/29 - H parahemolyticus UC - neg  Abx:  Unasyn, start date 10/04>>>10/7 Zosyn, start date 9/29 >>>10/04 Vancomycin, start date 9/29 >>>10/04 Meropenem 10/7>>>  Favor this being citrate as such fast resolsution reaction from unasyn if was this agent List citrate as allergy  ENDOCRINE A:  Hyperglycemia  P:  Accuchecks q4hr Glu wnl  NEUROLOGIC A:  Myasthenia gravis crisis P:  RASS goal: 0 Fentanyl infusion Continue Mestinon 2mg  IV q6hr plex Sunday Nif/ vc WUA  FAMILY  - Updates: daughter updated by DF 10/7  - Inter-disciplinary family meet or Palliative Care meeting due by: 10/6   Ccm time 30 min   [name]. Tyson Alias, MD, FACP Pgr: [phone] [facility]   Concern about line not intact after removal Repeat pcxr with no leads on chest

## 2015-03-20 NOTE — Progress Notes (Signed)
Notified E-link about patient's continuous itching, enough to cause agitation.  Patient has no skin rash or hives.  Patient has been administered PRN dose of benedryl.  Orders received for prescription skin cream.  Will continue to monitor and assess.

## 2015-03-20 NOTE — Progress Notes (Signed)
Patient will not receive CPT at this time due to patient sitting in chair.

## 2015-03-20 NOTE — Progress Notes (Signed)
eLink Physician-Brief Progress Note Patient Name: Cheyenne Boone DOB: 15-Jan-1924 MRN: [id number]   Date of Service  03/20/2015  HPI/Events of Note  Agitation. Trying to get out of bed. Bedside nurse requests bilateral wrist restraints.   eICU Interventions  Will order: 1. Bilateral wrist restraints.      Intervention Category Minor Interventions: Agitation / anxiety - evaluation and management  Cleven Jansma Eugene 03/20/2015, 6:29 PM

## 2015-03-20 NOTE — Progress Notes (Signed)
eLink Physician-Brief Progress Note Patient Name: SARALYNN LANGHORST DOB: 01-14-1924 MRN: [id number]   Date of Service  03/20/2015  HPI/Events of Note  AFIB with RVR (HR = 147). BP = 87/64 (MAP = 64).   eICU Interventions  Will order: 1. Restart Norepinephrine IV infusion. 2. BMP and Mg++ now. 3. Troponin now and Q 6 hours X 2. (total = 3 sets).     Intervention Category Major Interventions: Arrhythmia - evaluation and management  Sommer,Steven Eugene 03/20/2015, 9:06 PM

## 2015-03-20 NOTE — Progress Notes (Signed)
eLink Physician-Brief Progress Note Patient Name: Cheyenne Boone DOB: [birth year] MRN: [id number]   Date of Service  03/20/2015  HPI/Events of Note  Agitated. Trying to get out of bed.   eICU Interventions  Will order: 1. Precedex IV infusion. Titrate to RASS = 0 to -1.     Intervention Category Minor Interventions: Agitation / anxiety - evaluation and management  Luetta Piazza Eugene 03/20/2015, 6:10 PM

## 2015-03-20 NOTE — Progress Notes (Signed)
Notified RT that patient's O2 sats 88-90.  RT at bedside, administered breathing treatment and increased FIO2 to 40.

## 2015-03-20 NOTE — Progress Notes (Addendum)
Clarified with E-link doctor to go ahead and administer patient's 200 dose of Unasyn.  E-link MD called back and told me not to administer the Unasyn, changed to meropenem.

## 2015-03-20 NOTE — Progress Notes (Signed)
   03/20/15 1900  Clinical Encounter Type  Visited With Health care provider  Visit Type Code  Referral From Nurse  Consult/Referral To Chaplain  [facility] responding to Code Blue; pt vitals returned no family present; RN contacting family and will notify [facility] to meet them and be available as needed. Erline Levine 7:52 PM

## 2015-03-20 NOTE — Progress Notes (Signed)
eLink Physician-Brief Progress Note Patient Name: Cheyenne Boone DOB: 05-09-1924 MRN: [id number]   Date of Service  03/20/2015  HPI/Events of Note  Concern for ABX allergy.  Seen at bedside by PCCM.  eICU Interventions  Steroids/atarax given Pharmacy consult for alternative ABX Unasyn d/ced     Intervention Category Intermediate Interventions: Other:  DETERDING,ELIZABETH 03/20/2015, 1:56 AM

## 2015-03-20 NOTE — Progress Notes (Signed)
Spoke with pharmacy about dosing frequency of Atarax.  Was told that recommended daily maximum is , and that a dose of  might be more effective for relief of itching (instead of ), and that it would be ok to administer a second PRN dose of  as needed.

## 2015-03-20 NOTE — Progress Notes (Signed)
ANTIBIOTIC CONSULT NOTE - INITIAL  Pharmacy Consult for Merrem Indication: aspiration PNA  Labs:  Recent Labs  03/17/15 0438  03/18/15 0343 03/19/15 0340 03/19/15 2344  WBC 6.8  --  8.3 6.6  --   HGB 10.1*  < > 9.9* 9.0* 8.5*  PLT 201  --  207 174  --   CREATININE 0.99  < > 0.92 0.81 0.80  < > = values in this interval not displayed. Estimated Creatinine Clearance: 37.7 mL/min (by C-G formula based on Cr of 0.8).   Microbiology: Recent Results (from the past 720 hour(s))  Culture, blood (routine x 2)     Status: None   Collection Time: 03/11/15  1:23 PM  Result Value Ref Range Status   Specimen Description BLOOD RIGHT ARM  Final   Special Requests BOTTLES DRAWN AEROBIC AND ANAEROBIC 2CC  Final   Culture NO GROWTH 6 DAYS  Final   Report Status 03/17/2015 FINAL  Final  Culture, blood (routine x 2)     Status: None   Collection Time: 03/11/15  1:23 PM  Result Value Ref Range Status   Specimen Description BLOOD LEFT ASSIST CONTROL  Final   Special Requests   Final    BOTTLES DRAWN AEROBIC AND ANAEROBIC 4CC ANAERO 7CC AERO   Culture NO GROWTH 6 DAYS  Final   Report Status 03/17/2015 FINAL  Final  MRSA PCR Screening     Status: None   Collection Time: 03/11/15  2:00 PM  Result Value Ref Range Status   MRSA by PCR  NEGATIVE Final    RESULTS INVALID. NOTIFIED JAMIE SMITH GREGORY AT 1730 FOR RECOLLECT MSS  Culture, bal-quantitative     Status: None   Collection Time: 03/11/15  6:45 PM  Result Value Ref Range Status   Specimen Description BRONCHIAL ALVEOLAR LAVAGE  Final   Special Requests Normal  Final   Gram Stain   Final    FEW WBC SEEN MODERATE GRAM POSITIVE COCCI FEW GRAM NEGATIVE RODS GOOD SPECIMEN - 80-90% WBCS    Culture LIGHT GROWTH HAEMOPHILUS PARAHAEMOLYTICUS  Final   Report Status 03/15/2015 FINAL  Final  MRSA PCR Screening     Status: None   Collection Time: 03/15/15 10:56 PM  Result Value Ref Range Status   MRSA by PCR NEGATIVE NEGATIVE Final    Comment:         The GeneXpert MRSA Assay (FDA approved for NASAL specimens only), is one component of a comprehensive MRSA colonization surveillance program. It is not intended to diagnose MRSA infection nor to guide or monitor treatment for MRSA infections.      Assessment/Plan: 81yo female started on Unasyn for aspiration PNA, rec'd 2 doses, pt began to c/o diffuse itching w/ concern for allergic rxn to abx, to change abx.  Will start Merrem 1g IV Q12H and monitor CBC and Cx.   Vernard Gambles, PharmD, BCPS  03/20/2015,2:02 AM

## 2015-03-20 NOTE — Progress Notes (Addendum)
Called e-link to notify doctor of suspected allergic reaction during PLEX treatment.  Patient has a diffuse red rash over most of her skin, area around eyes looks red and swollen, and patient's lips have swollen. Airway is free of stidor or wheezing. Patient is tachycardic HR:120. Patient's blood pressure is 133/67 (MAP 83), O2 97 on FIO2 30, PEEP 8. Dr. Donnamae Jude at bedside.  Orders received for a second dose of benedryl, steroids, and permission to administer the  of Atarax.

## 2015-03-20 NOTE — Progress Notes (Signed)
Will hold off on CPT due patient being placed in chair.  Will get CPT at 1600.

## 2015-03-20 NOTE — Progress Notes (Addendum)
NEURO HOSPITALIST PROGRESS NOTE   SUBJECTIVE:                                                                                                                        Resting in a chair at the bedside, looks comfortable. Weaning well, on pressure support and CIPAP. Had PLEX last night without hemodynamic instability but had an allergic reaction thought to be related to citrate. Hypophosphatemia corrected (2.9 today). CXR: effusions and lower lobe atelectasis left more than right. OBJECTIVE:                                                                                                                           Vital signs in last 24 hours: Temp:  [96.7 F (35.9 C)-99.7 F (37.6 C)] 97.7 F (36.5 C) (10/08 0816) Pulse Rate:  [55-117] 77 (10/08 1216) Resp:  [7-27] 22 (10/08 1216) BP: (84-164)/(41-115) 109/60 mmHg (10/08 1100) SpO2:  [85 %-100 %] 100 % (10/08 1216) FiO2 (%):  [30 %-40 %] 30 % (10/08 1216) Weight:  [57.5 kg (126 lb 12.2 oz)] 57.5 kg (126 lb 12.2 oz) (10/08 0500)  Intake/Output from previous day: 10/07 0701 - 10/08 0700 In: 1816.4 [I.V.:346.1; NG/GT:1075.3; IV Piggyback:395] Out: 800 [Urine:800] Intake/Output this shift: Total I/O In: 405 [I.V.:45; NG/GT:160; IV Piggyback:200] Out: 860 [Urine:860] Nutritional status: Diet NPO time specified  Past Medical History  Diagnosis Date  . Hypertension   . Hypercholesteremia   . Stroke [facility])    Physical exam:  Constitutional: elderly female, intubated on the vent, in no apparent distress. Eyes: no jaundice or exophthalmos.  Head: normocephalic. Neck: supple, no bruits, no JVD. Cardiac: no murmurs. Lungs: clear. Abdomen: soft, no tender, no mass. Extremities: no edema, clubbing, or cyanosis.  Skin: no rash   Neurologic Exam:  General: NAD Mental Status: Intubated on the vent but alert and awake and following commands.  Cranial Nerves: II: Discs flat bilaterally;  Visual fields grossly normal, pupils equal, round, reactive to light and accommodation III,IV, VI: mild left ptosis, extra-ocular motions intact bilaterally V,VII: smile symmetric, facial light touch sensation normal bilaterally VIII: hearing normal bilaterally IX,X: intubated XI: bilateral shoulder shrug no tested XII: intubated  Motor: Moves all limbs symmetrically Sensory: Pinprick and  light touch intact throughout, bilaterally Deep Tendon Reflexes:  1+ all over Plantars: Right: downgoingLeft: downgoing Cerebellar: No tested Gait:  Unable to test due to multiple leads  Lab Results: Lab Results  Component Value Date/Time   CHOL 131 03/10/2015 08:35 PM   CHOL 154 12/17/2013 03:26 PM   Lipid Panel No results for input(s): CHOL, TRIG, HDL, CHOLHDL, VLDL, LDLCALC in the last 72 hours.  Studies/Results: Dg Chest Port 1 View  03/20/2015   CLINICAL DATA:  Myasthenia gravis. Assess for central line fragment.  EXAM: PORTABLE CHEST 1 VIEW  COMPARISON:  03/20/2015 and multiple previous  FINDINGS: Endotracheal tube has its tip 7 RA mm above the carina. Soft feeding tube enters the stomach. Right internal jugular catheter has its tip in the SVC above the right atrium. All previously positioned monitor wires were removed from the region. Two snap artifacts do project over the chest. I do not see any evidence of a hyperdense intravascular foreign object. Effusions with lower lobe atelectasis persist, left more than right.  IMPRESSION: No evidence of intravascular foreign object.  Endotracheal tube a few mm above the carina.  Effusions and lower lobe atelectasis left more than right.   Electronically Signed   By: Paulina Fusi M.D.   On: 03/20/2015 10:27   Dg Chest Port 1 View  03/20/2015   CLINICAL DATA:  Acute respiratory failure.  EXAM: PORTABLE CHEST 1 VIEW  COMPARISON:  March 19, 2015.  FINDINGS: Stable cardiomegaly. Endotracheal tube is in grossly good  position with distal tip 2.5 cm above the carina. Right internal jugular dialysis catheter is noted with distal tip in expected position of the SVC. Feeding tube is seen entering the stomach. No pneumothorax is noted. Bilateral pleural effusions are noted with left greater than right. Bony thorax is intact.  IMPRESSION: Endotracheal tube in grossly good position. Stable bilateral pleural effusions are noted.   Electronically Signed   By: Lupita Raider, M.D.   On: 03/20/2015 08:32   Dg Chest Port 1 View  03/19/2015   CLINICAL DATA:  Check endotracheal tube placement  EXAM: PORTABLE CHEST - 1 VIEW  COMPARISON:  03/17/2015  FINDINGS: Cardiac shadow is stable. A left jugular line, feeding catheter and right jugular line are again seen and stable. The endotracheal tube is again noted approximately 1 cm above the carina. This could be withdrawn 1-2 cm as necessary. Bilateral pleural effusions are again identified with bibasilar infiltrative changes  IMPRESSION: Stable bibasilar changes.  Tubes and lines as described. The endotracheal tube could be removed 1-2 cm as necessary.   Electronically Signed   By: Alcide Clever M.D.   On: 03/19/2015 07:17    MEDICATIONS                                                                                                                        Scheduled: . acetylcysteine  4 mL Nebulization BID  . antiseptic oral rinse  7 mL Mouth Rinse QID  .  chlorhexidine gluconate  15 mL Mouth Rinse BID  . fentaNYL (SUBLIMAZE) injection  50 mcg Intravenous Once  . furosemide  20 mg Intravenous Q12H  . heparin  1,000 Units Intracatheter Once  . heparin  5,000 Units Subcutaneous 3 times per day  . hydroxypropyl methylcellulose / hypromellose  1 drop Both Eyes TID  . latanoprost  1 drop Right Eye QHS  . magnesium sulfate 1 - 4 g bolus IVPB  4 g Intravenous Once  . meropenem (MERREM) IV  1 g Intravenous Q12H  . pantoprazole (PROTONIX) IV  40 mg Intravenous QHS    ASSESSMENT/PLAN:                                                                                                            81 y/o transferred to [facility] for further management of likely MG crisis with respiratory failure. Patient is intubated on the vent but alert, awake and able to follow commands. Continue PLEX as schedule, but will call nephrologist on call  to get his opinion regarding citrate allergy on PLEX and possibility to switching to heparin. Will follow up.   Wyatt Portela, MD [facility] [phone]  03/20/2015, 12:17 PM

## 2015-03-21 ENCOUNTER — Inpatient Hospital Stay (HOSPITAL_COMMUNITY): Payer: Commercial Managed Care - HMO

## 2015-03-21 DIAGNOSIS — G7000 Myasthenia gravis without (acute) exacerbation: Secondary | ICD-10-CM

## 2015-03-21 LAB — PHOSPHORUS: Phosphorus: 3.1 mg/dL (ref 2.5–4.6)

## 2015-03-21 LAB — BASIC METABOLIC PANEL
Anion gap: 8 (ref 5–15)
BUN: 17 mg/dL (ref 6–20)
CHLORIDE: 105 mmol/L (ref 101–111)
CO2: 27 mmol/L (ref 22–32)
CREATININE: 0.87 mg/dL (ref 0.44–1.00)
Calcium: 9 mg/dL (ref 8.9–10.3)
GFR calc Af Amer: >60 mL/min (ref >60)
GFR calc non Af Amer: 57 mL/min — ABNORMAL LOW (ref >60)
GLUCOSE: 158 mg/dL — AB (ref 65–99)
Potassium: 3.5 mmol/L (ref 3.5–5.1)
SODIUM: 140 mmol/L (ref 135–145)

## 2015-03-21 LAB — CBC
HCT: 27 % — ABNORMAL LOW (ref 36.0–46.0)
Hemoglobin: 9 g/dL — ABNORMAL LOW (ref 12.0–15.0)
MCH: 31.1 pg (ref 26.0–34.0)
MCHC: 33.3 g/dL (ref 30.0–36.0)
MCV: 93.4 fL (ref 78.0–100.0)
PLATELETS: 217 10*3/uL (ref 150–400)
RBC: 2.89 MIL/uL — ABNORMAL LOW (ref 3.87–5.11)
RDW: 14.9 % (ref 11.5–15.5)
WBC: 22.2 10*3/uL — ABNORMAL HIGH (ref 4.0–10.5)

## 2015-03-21 LAB — GLUCOSE, CAPILLARY
GLUCOSE-CAPILLARY: 114 mg/dL — AB (ref 65–99)
GLUCOSE-CAPILLARY: 225 mg/dL — AB (ref 65–99)
GLUCOSE-CAPILLARY: 139 mg/dL — AB (ref 65–99)
GLUCOSE-CAPILLARY: 95 mg/dL (ref 65–99)
Glucose-Capillary: 128 mg/dL — ABNORMAL HIGH (ref 65–99)
Glucose-Capillary: 118 mg/dL — ABNORMAL HIGH (ref 65–99)

## 2015-03-21 LAB — TROPONIN I

## 2015-03-21 LAB — MAGNESIUM: Magnesium: 2.4 mg/dL (ref 1.7–2.4)

## 2015-03-21 MED ORDER — NOREPINEPHRINE BITARTRATE 1 MG/ML IV SOLN
0.0000 ug/min | INTRAVENOUS | Status: DC
  Administered 2015-03-21: 8 ug/min via INTRAVENOUS
  Filled 2015-03-21: qty 16

## 2015-03-21 MED ORDER — DIPHENHYDRAMINE HCL 25 MG PO CAPS: 25.0000 mg | ORAL_CAPSULE | Freq: Four times a day (QID) | ORAL | Status: DC | PRN

## 2015-03-21 MED ORDER — ACETAMINOPHEN 325 MG PO TABS: 650.0000 mg | ORAL_TABLET | ORAL | Status: DC | PRN

## 2015-03-21 MED ORDER — NOREPINEPHRINE BITARTRATE 1 MG/ML IV SOLN
0.0000 ug/min | INTRAVENOUS | Status: DC
  Administered 2015-03-21: 10 ug/min via INTRAVENOUS
  Filled 2015-03-21: qty 4

## 2015-03-21 MED ORDER — PANTOPRAZOLE SODIUM 40 MG PO PACK
40.0000 mg | PACK | Freq: Every day | ORAL | Status: DC
  Administered 2015-03-21 – 2015-03-25 (×5): 40 mg
  Filled 2015-03-21 (×6): qty 20

## 2015-03-21 MED ORDER — ACD FORMULA A 0.73-2.45-2.2 GM/100ML VI SOLN
500.0000 mL | Status: DC
  Filled 2015-03-21: qty 500

## 2015-03-21 MED ORDER — POTASSIUM CHLORIDE 10 MEQ/50ML IV SOLN
10.0000 meq | INTRAVENOUS | Status: AC
  Administered 2015-03-21 (×2): 10 meq via INTRAVENOUS
  Filled 2015-03-21 (×2): qty 50

## 2015-03-21 MED ORDER — ALBUMIN HUMAN 25 % IV SOLN
INTRAVENOUS | Status: DC
  Filled 2015-03-21 (×3): qty 200

## 2015-03-21 MED ORDER — SODIUM CHLORIDE 0.9 % IV SOLN
4.0000 g | Freq: Once | INTRAVENOUS | Status: DC
  Filled 2015-03-21: qty 40

## 2015-03-21 MED ORDER — HEPARIN SODIUM (PORCINE) 1000 UNIT/ML IJ SOLN: 1000.0000 [IU] | Freq: Once | INTRAMUSCULAR | Status: DC

## 2015-03-21 MED ORDER — ACD FORMULA A 0.73-2.45-2.2 GM/100ML VI SOLN
Status: AC
  Filled 2015-03-21: qty 500

## 2015-03-21 MED FILL — Medication: Qty: 1 | Status: AC

## 2015-03-21 NOTE — Progress Notes (Addendum)
At 1926, pt was unresponsive and not able to arouse with stimuli. At this time, precedex and fentanyl were turned off. Radial pulse was absent and femoral pulse appeared to be very weak to absent as well. Immediately auscultated for heart sounds, none were present, and code initiated. Began to provide chest compressions, pt was placed on Zoll and back board was put under. BP read 39/27 1936, took a couple of cycles to finally take. 1amp of EPI, 1 amp of Sodium Bicarb, and 1 amp of Calcium Chloride was administered. ROSS obtained. Pt began to follow commands at 2000, squeezing hands with equal strength and moving extremities. PERRLA intact.

## 2015-03-21 NOTE — Progress Notes (Signed)
Amiodarone was held and not administered pt heart rate seemed to be converting and decreasing. Notified Elink RN Solmon Ice to report to MD Sommers.

## 2015-03-21 NOTE — Progress Notes (Signed)
NEURO HOSPITALIST PROGRESS NOTE   SUBJECTIVE:                                                                                                                        Events of last night noted. She remains intubated on the vent but awake and following commands. Phosphorous 3.1, wbc 22.2, troponin <0.3, CMP unremarkable.  CXR done, results pending.  OBJECTIVE:                                                                                                                           Vital signs in last 24 hours: Temp:  [97.5 F (36.4 C)-99.4 F (37.4 C)] 98.7 F (37.1 C) (10/09 0420) Pulse Rate:  [29-141] 79 (10/09 0500) Resp:  [11-27] 19 (10/09 0500) BP: (39-185)/(27-113) 141/61 mmHg (10/09 0500) SpO2:  [83 %-100 %] 100 % (10/09 0500) FiO2 (%):  [30 %-100 %] 30 % (10/09 0600) Weight:  [67.1 kg (147 lb 14.9 oz)] 67.1 kg (147 lb 14.9 oz) (10/09 0455)  Intake/Output from previous day: 10/08 0701 - 10/09 0700 In: 1744.8 [I.V.:914.8; NG/GT:480; IV Piggyback:350] Out: 1820 [Urine:1820] Intake/Output this shift:   Nutritional status: Diet NPO time specified  Past Medical History  Diagnosis Date  . Hypertension   . Hypercholesteremia   . Stroke [facility])   Physical exam:  Constitutional: elderly female, intubated on the vent, in no apparent distress. Eyes: no jaundice or exophthalmos.  Head: normocephalic. Neck: supple, no bruits, no JVD. Cardiac: no murmurs. Lungs: clear. Abdomen: soft, no tender, no mass. Extremities: no edema, clubbing, or cyanosis.  Skin: no rash  Neurologic Exam:  General: NAD Mental Status: Intubated on the vent but alert and awake and following commands.  Cranial Nerves: II: Discs flat bilaterally; Visual fields grossly normal, pupils equal, round, reactive to light and accommodation III,IV, VI: mild left ptosis, extra-ocular motions intact bilaterally V,VII: smile symmetric, facial light touch sensation normal  bilaterally VIII: hearing normal bilaterally IX,X: intubated XI: bilateral shoulder shrug no tested XII: intubated  Motor: Moves all limbs symmetrically Sensory: Pinprick and light touch intact throughout, bilaterally Deep Tendon Reflexes:  1+ all over Plantars: Right: downgoingLeft: downgoing Cerebellar: No tested Gait:  Unable to test due to multiple leads  Lab Results: Lab Results  Component Value Date/Time   CHOL 131 03/10/2015 08:35 PM   CHOL 154 12/17/2013 03:26 PM   Lipid Panel No results for input(s): CHOL, TRIG, HDL, CHOLHDL, VLDL, LDLCALC in the last 72 hours.  Studies/Results: Dg Chest Port 1 View  03/20/2015   CLINICAL DATA:  Myasthenia gravis. Assess for central line fragment.  EXAM: PORTABLE CHEST 1 VIEW  COMPARISON:  03/20/2015 and multiple previous  FINDINGS: Endotracheal tube has its tip 7 RA mm above the carina. Soft feeding tube enters the stomach. Right internal jugular catheter has its tip in the SVC above the right atrium. All previously positioned monitor wires were removed from the region. Two snap artifacts do project over the chest. I do not see any evidence of a hyperdense intravascular foreign object. Effusions with lower lobe atelectasis persist, left more than right.  IMPRESSION: No evidence of intravascular foreign object.  Endotracheal tube a few mm above the carina.  Effusions and lower lobe atelectasis left more than right.   Electronically Signed   By: Paulina Fusi M.D.   On: 03/20/2015 10:27   Dg Chest Port 1 View  03/20/2015   CLINICAL DATA:  Acute respiratory failure.  EXAM: PORTABLE CHEST 1 VIEW  COMPARISON:  March 19, 2015.  FINDINGS: Stable cardiomegaly. Endotracheal tube is in grossly good position with distal tip 2.5 cm above the carina. Right internal jugular dialysis catheter is noted with distal tip in expected position of the SVC. Feeding tube is seen entering the stomach. No pneumothorax is noted.  Bilateral pleural effusions are noted with left greater than right. Bony thorax is intact.  IMPRESSION: Endotracheal tube in grossly good position. Stable bilateral pleural effusions are noted.   Electronically Signed   By: Lupita Raider, M.D.   On: 03/20/2015 08:32    MEDICATIONS                                                                                                                        Scheduled: . therapeutic plasma exchange solution   Dialysis Q1 Hr x 3  . amiodarone      . amiodarone  150 mg Intravenous Once  . antiseptic oral rinse  7 mL Mouth Rinse QID  . calcium gluconate IVPB  4 g Intravenous Once  . chlorhexidine gluconate  15 mL Mouth Rinse BID  . fentaNYL (SUBLIMAZE) injection  50 mcg Intravenous Once  . furosemide  20 mg Intravenous Q12H  . heparin  1,000 Units Intracatheter Once  . heparin  1,000 Units Intracatheter Once  . heparin  5,000 Units Subcutaneous 3 times per day  . hydroxypropyl methylcellulose / hypromellose  1 drop Both Eyes TID  . latanoprost  1 drop Right Eye QHS  . meropenem (MERREM) IV  1 g Intravenous Q12H  . pantoprazole (PROTONIX) IV  40 mg Intravenous QHS  . potassium chloride  10 mEq Intravenous Q1 Hr x 2    ASSESSMENT/PLAN:  81 y/o transferred to [facility] for further management of likely MG crisis with respiratory failure. Patient is intubated on the vent but alert, awake and able to follow commands. PLEX 3/5 today. Weaning trial/extubation as per critical care attending. Will follow up.  Wyatt Portela, MD [facility] [phone]  03/21/2015, 7:57 AM

## 2015-03-21 NOTE — Progress Notes (Signed)
[place] ICU Electrolyte Replacement Protocol  Patient Name: Cheyenne Boone DOB: [birth year] MRN: [id number]  Date of Service  03/21/2015   HPI/Events of Note    Recent Labs Lab 03/18/15 0343  03/19/15 0340 03/19/15 1629 03/19/15 2344 03/20/15 0340 03/20/15 1707 03/20/15 2134 03/21/15 0502  NA 142  --  138  --  142 141  --  137 140  K 3.2*  --  3.2*  --  3.8 3.7  --  3.4* 3.5  CL 112*  --  107  --  106 114*  --  104 105  CO2 22  --  24  --   --  22  --  28 27  GLUCOSE 123*  --  111*  --  101* 142*  --  330* 158*  BUN 9  --  6  --  12 13  --  16 17  CREATININE 0.92  --  0.81  --  0.80 0.70  --  0.99 0.87  CALCIUM 8.4*  --  8.3*  --   --  8.0*  --  12.5* 9.0  MG  --   < > 1.7 2.1  --  1.6* 2.8* 2.6* 2.4  PHOS  --   < > 1.9* 3.2  --  2.9 2.8  --  3.1  < > = values in this interval not displayed.  Estimated Creatinine Clearance: 39.4 mL/min (by C-G formula based on Cr of 0.87).  Intake/Output      10/08 0701 - 10/09 0700   I.V. (mL/kg) 914.8 (13.6)   NG/GT 480   IV Piggyback 350   Total Intake(mL/kg) 1744.8 (26)   Urine (mL/kg/hr) 1820 (1.1)   Emesis/NG output 0 (0)   Total Output 1820   Net -75.2        - I/O DETAILED x24h    Total I/O In: 825.8 [I.V.:725.8; IV Piggyback:100] Out: 260 [Urine:260] - I/O THIS SHIFT    ASSESSMENT   eICURN Interventions   ICU Electrolyte Replacement Protocol criteria met. Labs Replaced per protocol. MD notified   ASSESSMENT: MAJOR ELECTROLYTE    Lorene Dy 03/21/2015, 5:45 AM

## 2015-03-21 NOTE — Progress Notes (Signed)
Called primary nurse to receive report on patient before performing apheresis.  Primary nurse stated that Dr Tyson Alias was at the bedside and requested that PLEX be held today related to respiratory events that occurred last night.  Dr. Leroy Kennedy called and made aware.

## 2015-03-21 NOTE — Progress Notes (Signed)
NIF -20. VC 420

## 2015-03-21 NOTE — Progress Notes (Signed)
PULMONARY / CRITICAL CARE MEDICINE   Name: Cheyenne Boone MRN: [id number] DOB: January 06, 1924    ADMISSION DATE:  03/15/2015 CONSULTATION DATE:  03/15/2015  REFERRING MD :  Jamison Neighbor  CHIEF COMPLAINT:  Dysphagia  INITIAL PRESENTATION: 81 year old female with 10 day history of dysphagia. Was admitted to [facility] for what was suspected to be myasthenia gravis flare. She was started on on IVIG and mestinon, however eventually required intubation. Transferred to [facility] at family request and for possible PLEX. PCCM to admit.   STUDIES:  9/27 CT head - Stable noncontrast CT appearance of the brain since May. No acute intracranial abnormality  9/27 Barium swallow - Nondiagnostic barium swallow examination due to the patient's inability to more than tiny amounts of the barium, Possible mass in the AP window. Possible infrahilar pneumonia on the left.  9/27 CT chest - Significant coronary artery disease, mitral annulus and aortic valve disease. Tortuous aorta without aneurysm. Large hiatal hernia. No associated obstruction.  9/27 CT soft tissue neck - Moderate calcified plaque is noted in proximal right internal carotid artery; carotid ultrasound is recommended for further evaluation. Fluid is noted in the right mastoid air cells. Clinical correlation is recommended to rule out mastoiditis.  9/28 MRI brain - No acute intracranial abnormality. Chronic small vessel ischemic disease is stable since 2015. Chronic cervical spine degeneration with C3-C4 and C4-C5 spinal stenosis which does not appear significantly progressed since 2015.  9/28 Carotid doppler - Right carotid bifurcation and proximal ICA plaque, resulting in less than 50% diameter stenosis. The exam does not exclude plaque ulceration or embolization.  SIGNIFICANT EVENTS: 9/27 - Admit for dysphagia  9/29 - Transfer to ICU @ [facility] for respiratory failure & intubated>>Bronchoscopy with bilateral secretions & plugging 10/01 - Extubated 10/03 -  Reintubated 10/03 - Transfer to [facility] from [facility] 10/4- HD cath placed 10/5 PLEX planned, EGD for OGT placement. PLEX stopped due to hypotension 10/7- concern for allergic rxn to UNasyn or plex citrate, improved with treatment 10/8- delirium severe--> precedex-->PEA short arrest--> woke up  SUBJECTIVE: awake, SR  VITAL SIGNS: Temp:  [97.5 F (36.4 C)-99.4 F (37.4 C)] 99.4 F (37.4 C) (10/09 0753) Pulse Rate:  [29-141] 61 (10/09 0900) Resp:  [11-27] 12 (10/09 0900) BP: (39-185)/(27-113) 115/49 mmHg (10/09 0900) SpO2:  [83 %-100 %] 100 % (10/09 0900) FiO2 (%):  [30 %-100 %] 30 % (10/09 0815) Weight:  [67.1 kg (147 lb 14.9 oz)] 67.1 kg (147 lb 14.9 oz) (10/09 0455) HEMODYNAMICS:   VENTILATOR SETTINGS: Vent Mode:  [-] PRVC FiO2 (%):  [30 %-100 %] 30 % Set Rate:  [12 bmp] 12 bmp Vt Set:  [400 mL] 400 mL PEEP:  [8 cmH20] 8 cmH20 Pressure Support:  [8 cmH20] 8 cmH20 Plateau Pressure:  [18 cmH20-20 cmH20] 20 cmH20 INTAKE / OUTPUT:  Intake/Output Summary (Last 24 hours) at 03/21/15 0939 Last data filed at 03/21/15 0800  Gross per 24 hour  Intake 1727.87 ml  Output   1790 ml  Net -62.13 ml    PHYSICAL EXAMINATION: General: Elderly female on vent, weak  Neuro: Alert, follows commands, moves all ext equally HEENT: no swelling Cardiovascular: s1 s2 RRR Lungs: CTA, reduced rt base Abdomen: Soft, non-tender, non-distended Musculoskeletal: edema feet Skin: Grossly intact  LABS:  CBC  Recent Labs Lab 03/19/15 0340 03/19/15 2344 03/20/15 0340 03/21/15 0502  WBC 6.6  --  9.8 22.2*  HGB 9.0* 8.5* 9.2* 9.0*  HCT 26.5* 25.0* 27.6* 27.0*  PLT 174  --  162 217   Coag's  Recent Labs Lab 03/15/15 2245  APTT 30  INR 1.02   BMET  Recent Labs Lab 03/20/15 0340 03/20/15 2134 03/21/15 0502  NA 141 137 140  K 3.7 3.4* 3.5  CL 114* 104 105  CO2 22 28 27   BUN 13 16 17   CREATININE 0.70 0.99 0.87  GLUCOSE 142* 330* 158*   Electrolytes  Recent  Labs Lab 03/20/15 0340 03/20/15 1707 03/20/15 2134 03/21/15 0502  CALCIUM 8.0*  --  12.5* 9.0  MG 1.6* 2.8* 2.6* 2.4  PHOS 2.9 2.8  --  3.1   Sepsis Markers  Recent Labs Lab 03/15/15 2245  LATICACIDVEN 1.4  PROCALCITON 0.38   ABG  Recent Labs Lab 03/16/15 0321 03/18/15 0425  PHART 7.449 7.398  PCO2ART 36.3 33.9*  PO2ART 107.0* 150*   Liver Enzymes  Recent Labs Lab 03/15/15 2245 03/17/15 0438  AST 41 23  ALT 26 20  ALKPHOS 94 75  BILITOT 1.3* 1.0  ALBUMIN 2.7* 2.2*   Cardiac Enzymes  Recent Labs Lab 03/16/15 1105 03/20/15 2134 03/21/15 0501  TROPONINI <0.03 <0.03 <0.03   Glucose  Recent Labs Lab 03/20/15 0819 03/20/15 1256 03/20/15 1556 03/20/15 2000 03/21/15 0419 03/21/15 0754  GLUCAP 166* 191* 165* 139* 114* 128*    Imaging Dg Chest Port 1 View  03/21/2015   CLINICAL DATA:  Ventilator support. Respiratory failure. Follow-up.  EXAM: PORTABLE CHEST 1 VIEW  COMPARISON:  03/20/2015 and previous.  FINDINGS: Endotracheal tube is at the carina, directed towards the right mainstem bronchus. Care should be made not to advance this any further. Feeding tube enters the abdomen. Right internal jugular central line tip is in the SVC. External defibrillator is overlie the chest. Upper lungs are largely clear. Effusions are present bilaterally with volume loss in both lower lobes.  IMPRESSION: Endotracheal tube at the carina.  Effusions with volume loss in the lower lobes, possibly slightly worsened.   Electronically Signed   By: Paulina Fusi M.D.   On: 03/21/2015 08:25   Dg Chest Port 1 View  03/20/2015   CLINICAL DATA:  Myasthenia gravis. Assess for central line fragment.  EXAM: PORTABLE CHEST 1 VIEW  COMPARISON:  03/20/2015 and multiple previous  FINDINGS: Endotracheal tube has its tip 7 RA mm above the carina. Soft feeding tube enters the stomach. Right internal jugular catheter has its tip in the SVC above the right atrium. All previously positioned monitor  wires were removed from the region. Two snap artifacts do project over the chest. I do not see any evidence of a hyperdense intravascular foreign object. Effusions with lower lobe atelectasis persist, left more than right.  IMPRESSION: No evidence of intravascular foreign object.  Endotracheal tube a few mm above the carina.  Effusions and lower lobe atelectasis left more than right.   Electronically Signed   By: Paulina Fusi M.D.   On: 03/20/2015 10:27     ASSESSMENT / PLAN:  PULMONARY OETT 9/29 > 10/1, 10/3 >>> A: Acute respiratory failure in setting myasthenia gravis flare Mucus plugging - S/P bronch & BAL 9/29 pcxr 10/7 worsening atx rt base, improved 10/8  P:  Dc chest pt Peep to 5 as goal Wean sbt cpap 5 ps 5, goal 1 hr Thick secretions, will await lest plex for extubation consideration Repeat pcxr, has improved today Wean today cpap 8 ps 5-10 plex held today as hemodynamics have been a concern on plex and now PEA overnight NIF / VC wil need  leak prior to extubation with new reaction concerns  CARDIOVASCULAR CVL [facility] 10/3 > A:  H/O HTN H/O HLD Drop in BP with one of plex treatment PEA arrest 10/8 secondary to precedex, r/o AS, occult valvular dz P:  plex Monday , hold today given above Get echo Tele List precedex as SE / allergy Avoid lasix until we r.o AS etc  RENAL A:  Acute Renal Failure  Hypokalemia - resolved hypomag  P:  Even to pos balance Chem in am   GASTROINTESTINAL A:  Large hiatal hernia Dysphagia Difficult NGT placement P:  Protonix IV daily for prophylaxis Restart TF back to goal  HEMATOLOGIC A: DVT prevention, leukocytosis P:  Heparin for VTE prophylaxis SCDs Wbc post arrest , likely demargination  INFECTIOUS A:  Leukocytosis in setting oral steroids Aspiration PNA - H parahaemolytics on BAL Unasyn concern allergy likley citrate reaction P:  BCx2 9/29 >>> No growth BAL 9/29 - H parahemolyticus UC -  neg  Abx:  Unasyn, start date 10/04>>>10/7 Zosyn, start date 9/29 >>>10/04 Vancomycin, start date 9/29 >>>10/04 Meropenem 10/7>>>  Consider stop date at 10 days  ENDOCRINE A:  Hyperglycemia  P:  Accuchecks q4hr Glu wnl  NEUROLOGIC A:  Myasthenia gravis crisis P:  RASS goal: 0 Fentanyl infusion Continue Mestinon  IV q6hr plex to Monday , holding today given hemodynamics Nif/ vc WUA adding precedex to allergy list  FAMILY  - Updates: daughter updated by DF 10/7  - Inter-disciplinary family meet or Palliative Care meeting due by: 10/6, DONE by DF 10/9 =  I have had extensive discussions with family daughter. We discussed patients current circumstances and organ failures. We also discussed patient's prior wishes under circumstances such as this. Family has decided to NOT perform resuscitation if arrest but to continue current medical support for now. Also dni with planned extubation.   Ccm time 30 min   [name]. Tyson Alias, MD, FACP Pgr: [phone] [facility]

## 2015-03-21 NOTE — Progress Notes (Signed)
NIF and VC performed.  NIF of -20, VC of 750.  Patient attempted to give good effort, however patient was still drowsy.

## 2015-03-22 ENCOUNTER — Inpatient Hospital Stay (HOSPITAL_COMMUNITY): Payer: Commercial Managed Care - HMO

## 2015-03-22 LAB — POCT I-STAT, CHEM 8
BUN: 20 mg/dL (ref 6–20)
CALCIUM ION: 1.18 mmol/L (ref 1.13–1.30)
CREATININE: 0.9 mg/dL (ref 0.44–1.00)
Chloride: 99 mmol/L — ABNORMAL LOW (ref 101–111)
Glucose, Bld: 143 mg/dL — ABNORMAL HIGH (ref 65–99)
HCT: 21 % — ABNORMAL LOW (ref 36.0–46.0)
Hemoglobin: 7.1 g/dL — ABNORMAL LOW (ref 12.0–15.0)
Potassium: 3.3 mmol/L — ABNORMAL LOW (ref 3.5–5.1)
Sodium: 139 mmol/L (ref 135–145)
TCO2: 27 mmol/L (ref 0–100)

## 2015-03-22 LAB — CBC WITH DIFFERENTIAL/PLATELET
BASOS PCT: 0 %
Basophils Absolute: 0 10*3/uL (ref 0.0–0.1)
EOS ABS: 0.5 10*3/uL (ref 0.0–0.7)
EOS PCT: 5 %
HCT: 24.8 % — ABNORMAL LOW (ref 36.0–46.0)
Hemoglobin: 8 g/dL — ABNORMAL LOW (ref 12.0–15.0)
LYMPHS ABS: 1.1 10*3/uL (ref 0.7–4.0)
Lymphocytes Relative: 12 %
MCH: 30.4 pg (ref 26.0–34.0)
MCHC: 32.3 g/dL (ref 30.0–36.0)
MCV: 94.3 fL (ref 78.0–100.0)
MONOS PCT: 14 %
Monocytes Absolute: 1.4 10*3/uL — ABNORMAL HIGH (ref 0.1–1.0)
Neutro Abs: 6.8 10*3/uL (ref 1.7–7.7)
Neutrophils Relative %: 69 %
PLATELETS: 163 10*3/uL (ref 150–400)
RBC: 2.63 MIL/uL — ABNORMAL LOW (ref 3.87–5.11)
RDW: 15.5 % (ref 11.5–15.5)
WBC: 9.7 10*3/uL (ref 4.0–10.5)

## 2015-03-22 LAB — GLUCOSE, CAPILLARY
GLUCOSE-CAPILLARY: 111 mg/dL — AB (ref 65–99)
GLUCOSE-CAPILLARY: 122 mg/dL — AB (ref 65–99)
Glucose-Capillary: 102 mg/dL — ABNORMAL HIGH (ref 65–99)
Glucose-Capillary: 157 mg/dL — ABNORMAL HIGH (ref 65–99)

## 2015-03-22 LAB — BASIC METABOLIC PANEL
Anion gap: 6 (ref 5–15)
BUN: 18 mg/dL (ref 6–20)
CALCIUM: 8.4 mg/dL — AB (ref 8.9–10.3)
CHLORIDE: 101 mmol/L (ref 101–111)
CO2: 31 mmol/L (ref 22–32)
CREATININE: 0.86 mg/dL (ref 0.44–1.00)
GFR calc non Af Amer: 58 mL/min — ABNORMAL LOW (ref >60)
Glucose, Bld: 123 mg/dL — ABNORMAL HIGH (ref 65–99)
Potassium: 3.5 mmol/L (ref 3.5–5.1)
SODIUM: 138 mmol/L (ref 135–145)

## 2015-03-22 MED ORDER — HEPARIN SODIUM (PORCINE) 1000 UNIT/ML IJ SOLN
1000.0000 [IU] | Freq: Once | INTRAMUSCULAR | Status: AC
  Administered 2015-03-22: 1000 [IU]

## 2015-03-22 MED ORDER — ACD FORMULA A 0.73-2.45-2.2 GM/100ML VI SOLN
500.0000 mL | Status: DC
  Filled 2015-03-22: qty 500

## 2015-03-22 MED ORDER — ACD FORMULA A 0.73-2.45-2.2 GM/100ML VI SOLN
Status: AC
  Administered 2015-03-22: 500 mL via INTRAVENOUS
  Filled 2015-03-22: qty 500

## 2015-03-22 MED ORDER — DIPHENHYDRAMINE HCL 25 MG PO CAPS: 25.0000 mg | ORAL_CAPSULE | Freq: Four times a day (QID) | ORAL | Status: DC | PRN

## 2015-03-22 MED ORDER — SODIUM CHLORIDE 0.9 % IV SOLN
INTRAVENOUS | Status: AC
  Administered 2015-03-22 (×3): via INTRAVENOUS_CENTRAL
  Filled 2015-03-22 (×3): qty 200

## 2015-03-22 MED ORDER — CALCIUM GLUCONATE 10 % IV SOLN
2.0000 g | Freq: Once | INTRAVENOUS | Status: DC
  Filled 2015-03-22: qty 20

## 2015-03-22 MED ORDER — ACETAMINOPHEN 325 MG PO TABS: 650.0000 mg | ORAL_TABLET | ORAL | Status: DC | PRN

## 2015-03-22 MED ORDER — SODIUM CHLORIDE 0.9 % IV SOLN
4.0000 g | Freq: Once | INTRAVENOUS | Status: AC
  Administered 2015-03-22: 2 g via INTRAVENOUS
  Filled 2015-03-22: qty 40

## 2015-03-22 NOTE — Progress Notes (Signed)
Fentanyl 225 mls wasted and witnessed by Tory Emerald

## 2015-03-22 NOTE — Progress Notes (Signed)
NIF and VC performed with patient.  NIF of -18, VC of 600.  Patient was still lethargic to give best effort.

## 2015-03-22 NOTE — Progress Notes (Signed)
TPE- Per Neuro continue with PLEX treatment and use Levophed prn. Notified primary RN

## 2015-03-22 NOTE — Progress Notes (Signed)
PULMONARY / CRITICAL CARE MEDICINE   Name: Cheyenne Boone MRN: [id number] DOB: May 03, 1924    ADMISSION DATE:  03/15/2015 CONSULTATION DATE:  03/15/2015  REFERRING MD :  Jamison Neighbor  CHIEF COMPLAINT:  Dysphagia  INITIAL PRESENTATION: 81 year old female with 10 day history of dysphagia. Was admitted to [facility] for what was suspected to be myasthenia gravis flare. She was started on on IVIG and mestinon, however eventually required intubation. Transferred to [facility] at family request and for possible PLEX. PCCM to admit.   STUDIES:  9/27 CT head - Stable noncontrast CT appearance of the brain since May. No acute intracranial abnormality  9/27 Barium swallow - Nondiagnostic barium swallow examination due to the patient's inability to more than tiny amounts of the barium, Possible mass in the AP window. Possible infrahilar pneumonia on the left.  9/27 CT chest - Significant coronary artery disease, mitral annulus and aortic valve disease. Tortuous aorta without aneurysm. Large hiatal hernia. No associated obstruction.  9/27 CT soft tissue neck - Moderate calcified plaque is noted in proximal right internal carotid artery; carotid ultrasound is recommended for further evaluation. Fluid is noted in the right mastoid air cells. Clinical correlation is recommended to rule out mastoiditis.  9/28 MRI brain - No acute intracranial abnormality. Chronic small vessel ischemic disease is stable since 2015. Chronic cervical spine degeneration with C3-C4 and C4-C5 spinal stenosis which does not appear significantly progressed since 2015.  9/28 Carotid doppler - Right carotid bifurcation and proximal ICA plaque, resulting in less than 50% diameter stenosis. The exam does not exclude plaque ulceration or embolization.  SIGNIFICANT EVENTS: 9/27 - Admit for dysphagia  9/29 - Transfer to ICU @ [facility] for respiratory failure & intubated>>Bronchoscopy with bilateral secretions & plugging 10/01 - Extubated 10/03 -  Reintubated 10/03 - Transfer to [facility] from [facility] 10/4- HD cath placed 10/5 PLEX planned, EGD for OGT placement. PLEX stopped due to hypotension 10/7- concern for allergic rxn to UNasyn or plex citrate, improved with treatment 10/8- delirium severe--> precedex-->PEA short arrest--> woke up  SUBJECTIVE: Sleeping, opens eyes to voice, follows commands.  VITAL SIGNS: Temp:  [98.5 F (36.9 C)-100.5 F (38.1 C)] 100.5 F (38.1 C) (10/10 0759) Pulse Rate:  [42-130] 93 (10/10 0700) Resp:  [0-27] 17 (10/10 0700) BP: (65-163)/(36-104) 107/63 mmHg (10/10 0700) SpO2:  [58 %-100 %] 64 % (10/10 0700) FiO2 (%):  [30 %] 30 % (10/10 0404) Weight:  [130 lb 11.7 oz (59.3 kg)] 130 lb 11.7 oz (59.3 kg) (10/10 0420) HEMODYNAMICS:   VENTILATOR SETTINGS: Vent Mode:  [-] PRVC FiO2 (%):  [30 %] 30 % Set Rate:  [12 bmp] 12 bmp Vt Set:  [400 mL] 400 mL PEEP:  [5 cmH20] 5 cmH20 Plateau Pressure:  [17 cmH20-18 cmH20] 18 cmH20 INTAKE / OUTPUT:  Intake/Output Summary (Last 24 hours) at 03/22/15 0815 Last data filed at 03/22/15 0800  Gross per 24 hour  Intake 1430.09 ml  Output   1973 ml  Net -542.91 ml    PHYSICAL EXAMINATION: General: Elderly female on vent, strength improving Neuro: Alert, follows commands, moves all ext equally HEENT: no swelling Cardiovascular: s1 s2 RRR Lungs: CTA, reduced rt base Abdomen: Soft, non-tender, non-distended Musculoskeletal: edema feet Skin: Grossly intact  LABS:  CBC  Recent Labs Lab 03/20/15 0340 03/21/15 0502 03/22/15 0500  WBC 9.8 22.2* 9.7  HGB 9.2* 9.0* 8.0*  HCT 27.6* 27.0* 24.8*  PLT 162 217 163   Coag's  Recent Labs Lab 03/15/15 2245  APTT 30  INR 1.02   BMET  Recent Labs Lab 03/20/15 2134 03/21/15 0502 03/22/15 0500  NA 137 140 138  K 3.4* 3.5 3.5  CL 104 105 101  CO2 BUN CREATININE 0.99 0.87 0.86  GLUCOSE 330* 158* 123*   Electrolytes  Recent Labs Lab 03/20/15 0340 03/20/15 1707  03/20/15 2134 03/21/15 0502 03/22/15 0500  CALCIUM 8.0*  --  12.5* 9.0 8.4*  MG 1.6* 2.8* 2.6* 2.4  --   PHOS 2.9 2.8  --  3.1  --    Sepsis Markers  Recent Labs Lab 03/15/15 2245  LATICACIDVEN 1.4  PROCALCITON 0.38   ABG  Recent Labs Lab 03/16/15 0321 03/18/15 0425  PHART 7.449 7.398  PCO2ART 36.3 33.9*  PO2ART 107.0* 150*   Liver Enzymes  Recent Labs Lab 03/15/15 2245 03/17/15 0438  AST 41 23  ALT 26 20  ALKPHOS 94 75  BILITOT 1.3* 1.0  ALBUMIN 2.7* 2.2*   Cardiac Enzymes  Recent Labs Lab 03/20/15 2134 03/21/15 0501 03/21/15 0905  TROPONINI <0.03 <0.03 <0.03   Glucose  Recent Labs Lab 03/21/15 0419 03/21/15 0754 03/21/15 1146 03/21/15 1616 03/21/15 2009 03/21/15 2015  GLUCAP 114* 128* 225* 139* 95 118*    Imaging Dg Chest Port 1 View  03/22/2015   CLINICAL DATA:  Hypoxia.  Myasthenia gravis  EXAM: PORTABLE CHEST 1 VIEW  COMPARISON:  March 21, 2015  FINDINGS: Endotracheal tube tip remains at the carina, directed toward the right main bronchus, unchanged from 1 day prior. Feeding tube tip is in the stomach. Central catheter tip is in the superior vena cava. No pneumothorax. There are bilateral pleural effusions with mild interstitial edema. There is patchy airspace consolidation the left base. Heart is mildly enlarged with pulmonary vascular within normal limits. No adenopathy. There is calcification in the mitral annulus.  IMPRESSION: Tube positions as described without pneumothorax. Note that the endotracheal tube tip is at the carina directed toward the right main bronchus, stable. It may be prudent to consider withdrawing the endotracheal tube 2-3 cm in this regard.  There is evidence of a degree of congestive heart failure. Superimposed pneumonia in the left base cannot be excluded.   Electronically Signed   By: Bretta Bang III M.D.   On: 03/22/2015 07:14     ASSESSMENT / PLAN:  PULMONARY OETT 9/29 > 10/1, 10/3 >>> A: Acute  respiratory failure in setting myasthenia gravis flare Mucus plugging - S/P bronch & BAL 9/29 pcxr 10/7 worsening atx rt base, improved 10/8  P:  Plan for PLEX 3/5 today.  NIF / VC monitoring Continue to monitor secretions Will need leak prior to extubation with new reaction concerns  CARDIOVASCULAR CVL LIJ 10/3 > A:  H/O HTN H/O HLD Drop in BP with one of plex treatment PEA arrest 10/8 secondary to precedex, r/o AS, occult valvular dz P:  Echo pending Tele Avoid lasix until we r.o AS etc  RENAL A:  Acute Renal Failure  Hypokalemia - resolved hypomag - resolved P:  Even to pos balance Chem in am   GASTROINTESTINAL A:  Large hiatal hernia Dysphagia Difficult NGT placement P:  Protonix IV daily for prophylaxis Restart TF back to goal  HEMATOLOGIC A:  DVT prevention Leukocytosis post arrest - likely demargination P:  Heparin for VTE prophylaxis SCDs  INFECTIOUS A:  Leukocytosis in setting oral steroids Aspiration PNA - H parahaemolytics on BAL Concern for unasyn allergy P:  BCx2  9/29 >>> No growth BAL 9/29 - H parahemolyticus UC - neg Respiratory Culture 10/10 >>>  Abx:  Unasyn, start date 10/04>>>10/7 Zosyn, start date 9/29 >>>10/04 Vancomycin, start date 9/29 >>>10/04 Meropenem 10/7>>>  CXR in AM Consider stop date at 10 days  ENDOCRINE A:  Hyperglycemia  P:  Accuchecks q4hr Glu wnl  NEUROLOGIC A:  Myasthenia gravis crisis P:  RASS goal: 0 Fentanyl infusion Plex today Nif/ vc monitoring WUA  FAMILY  - Updates: daughter updated by DF 10/7  - Inter-disciplinary family meet or Palliative Care meeting due by: 10/6, DONE by DF 10/9 =  I have had extensive discussions with family daughter. We discussed patients current circumstances and organ failures. We also discussed patient's prior wishes under circumstances such as this. Family has decided to NOT perform resuscitation if arrest but to continue current  medical support for now. Also dni with planned extubation.   Katina Degree. Jimmey Ralph, MD [facility] Family Medicine Resident PGY-2 03/22/2015 8:23 AM

## 2015-03-22 NOTE — Progress Notes (Signed)
NEURO HOSPITALIST PROGRESS NOTE   SUBJECTIVE:                                                                                                                        She remains intubated on the vent but awake and following commands.  OBJECTIVE:                                                                                                                           Vital signs in last 24 hours: Temp:  [98.5 F (36.9 C)-100.5 F (38.1 C)] 100.5 F (38.1 C) (10/10 0759) Pulse Rate:  [42-130] 93 (10/10 0700) Resp:  [0-27] 17 (10/10 0700) BP: (65-163)/(36-104) 107/63 mmHg (10/10 0700) SpO2:  [58 %-100 %] 64 % (10/10 0700) FiO2 (%):  [30 %] 30 % (10/10 0404) Weight:  [59.3 kg (130 lb 11.7 oz)] 59.3 kg (130 lb 11.7 oz) (10/10 0420)  Intake/Output from previous day: 10/09 0701 - 10/10 0700 In: 1572.6 [I.V.:830.1; NG/GT:542.5; IV Piggyback:200] Out: 1953 [Urine:1953] Intake/Output this shift: Total I/O In: -  Out: 50 [Urine:50] Nutritional status:    Past Medical History  Diagnosis Date  . Hypertension   . Hypercholesteremia   . Stroke [facility])   Physical exam:  Constitutional: elderly female, intubated on the vent, in no apparent distress. Eyes: no jaundice or exophthalmos.  Head: normocephalic. Neck: supple, no bruits, no JVD. Cardiac: no murmurs. Lungs: clear. Abdomen: soft, no tender, no mass. Extremities: no edema, clubbing, or cyanosis.  Skin: no rash  Neurologic Exam:  General: NAD Mental Status: Intubated on the vent but alert and awake and following commands.  Cranial Nerves: II:  Visual fields grossly normal, pupils equal, round, reactive to light  III,IV, VI: mild left ptosis, extra-ocular motions intact bilaterally V,VII: face symmetric Motor: Moves all limbs symmetrically Sensory: light touch intact throughout, bilaterally  Lab Results: Lab Results  Component Value Date/Time   CHOL 131 03/10/2015 08:35 PM   CHOL  154 12/17/2013 03:26 PM   Lipid Panel No results for input(s): CHOL, TRIG, HDL, CHOLHDL, VLDL, LDLCALC in the last 72 hours.  Studies/Results: Dg Chest Port 1 View  03/22/2015   CLINICAL DATA:  Hypoxia.  Myasthenia gravis  EXAM: PORTABLE CHEST 1  VIEW  COMPARISON:  March 21, 2015  FINDINGS: Endotracheal tube tip remains at the carina, directed toward the right main bronchus, unchanged from 1 day prior. Feeding tube tip is in the stomach. Central catheter tip is in the superior vena cava. No pneumothorax. There are bilateral pleural effusions with mild interstitial edema. There is patchy airspace consolidation the left base. Heart is mildly enlarged with pulmonary vascular within normal limits. No adenopathy. There is calcification in the mitral annulus.  IMPRESSION: Tube positions as described without pneumothorax. Note that the endotracheal tube tip is at the carina directed toward the right main bronchus, stable. It may be prudent to consider withdrawing the endotracheal tube 2-3 cm in this regard.  There is evidence of a degree of congestive heart failure. Superimposed pneumonia in the left base cannot be excluded.   Electronically Signed   By: Bretta Bang III M.D.   On: 03/22/2015 07:14   Dg Chest Port 1 View  03/21/2015   CLINICAL DATA:  Ventilator support. Respiratory failure. Follow-up.  EXAM: PORTABLE CHEST 1 VIEW  COMPARISON:  03/20/2015 and previous.  FINDINGS: Endotracheal tube is at the carina, directed towards the right mainstem bronchus. Care should be made not to advance this any further. Feeding tube enters the abdomen. Right internal jugular central line tip is in the SVC. External defibrillator is overlie the chest. Upper lungs are largely clear. Effusions are present bilaterally with volume loss in both lower lobes.  IMPRESSION: Endotracheal tube at the carina.  Effusions with volume loss in the lower lobes, possibly slightly worsened.   Electronically Signed   By: Paulina Fusi  M.D.   On: 03/21/2015 08:25   Dg Chest Port 1 View  03/20/2015   CLINICAL DATA:  Myasthenia gravis. Assess for central line fragment.  EXAM: PORTABLE CHEST 1 VIEW  COMPARISON:  03/20/2015 and multiple previous  FINDINGS: Endotracheal tube has its tip 7 RA mm above the carina. Soft feeding tube enters the stomach. Right internal jugular catheter has its tip in the SVC above the right atrium. All previously positioned monitor wires were removed from the region. Two snap artifacts do project over the chest. I do not see any evidence of a hyperdense intravascular foreign object. Effusions with lower lobe atelectasis persist, left more than right.  IMPRESSION: No evidence of intravascular foreign object.  Endotracheal tube a few mm above the carina.  Effusions and lower lobe atelectasis left more than right.   Electronically Signed   By: Paulina Fusi M.D.   On: 03/20/2015 10:27    MEDICATIONS                                                                                                                        Scheduled: . amiodarone  150 mg Intravenous Once  . antiseptic oral rinse  7 mL Mouth Rinse QID  . chlorhexidine gluconate  15 mL Mouth Rinse BID  . fentaNYL (SUBLIMAZE) injection  50 mcg Intravenous Once  .  furosemide  20 mg Intravenous Q12H  . heparin  1,000 Units Intracatheter Once  . heparin  5,000 Units Subcutaneous 3 times per day  . hydroxypropyl methylcellulose / hypromellose  1 drop Both Eyes TID  . latanoprost  1 drop Right Eye QHS  . meropenem (MERREM) IV  1 g Intravenous Q12H  . pantoprazole sodium  40 mg Per Tube Daily    ASSESSMENT/PLAN:                                                                                                            81 y/o transferred to [facility] for further management of likely MG crisis with respiratory failure. Patient is intubated on the vent but alert, awake and able to follow commands. Hospital course complicated by brief arrest.   -goal for PLEX 3/5  today, held yesterday due to hemodynamic instability. -weaning trial/extubation as per critical care attending. -will follow up.  Elspeth Cho, DO [facility] [phone]  If 7pm- 7am, please page neurology on call as listed in AMION.   03/22/2015, 8:07 AM

## 2015-03-22 NOTE — Progress Notes (Signed)
Sputum sample obtained and sent down to main lab without complications.  

## 2015-03-22 NOTE — Progress Notes (Signed)
Physical Therapy Treatment Patient Details Name: Cheyenne Boone MRN: [id number] DOB: 1924-02-25 Today's Date: 03/22/2015    History of Present Illness Pt is an 81 year old female with 10 day history of dysphagia. Was admitted to [facility] for what was suspected to be myasthenia gravis flare. She was started on on IVIG and mestinon, however eventually required intubation. Transferred to [facility] at family request and for possible PLEX. On 10/5 PLEX planned, EGD for OGT placement. PLEX stopped due to hypotension. PMH includes severe cervical disc degeneration, HTN, and prior stroke. Additional acute events on 02/18/15 pt went into cardiac arrest (PEA with chest compressions).    PT Comments    Despite weekend events, pt was able to get OOB to chair and responded well. VSS throughout (see below).  She was following ~75% basic one step commands.  We continue to anticipate a long recovery, so SNF recommendation still appropriate.  PT will continue to follow acutely.   Follow Up Recommendations  SNF;Supervision/Assistance - 24 hour     Equipment Recommendations  None recommended by PT    Recommendations for Other Services   NA     Precautions / Restrictions Precautions Precautions: Fall Precaution Comments: on vent    Mobility  Bed Mobility Overal bed mobility: +2 for physical assistance;Needs Assistance Bed Mobility: Supine to Sit     Supine to sit: +2 for safety/equipment;Mod assist     General bed mobility comments: Two person mod assist to help progress legs and support trunk to get EOB.  Extra hands needed for line management.  Transfers Overall transfer level: Needs assistance Equipment used: 2 person hand held assist Transfers: Sit to/from [employer] Sit to Stand: +2 safety/equipment;Mod assist Stand pivot transfers: Mod assist;+2 safety/equipment       General transfer comment: Two person hand held assist to get to recliner chair on pt's left with two  people for line manafement and pt safety.  Went left due to vent on the right.  Pt's BP and vitals remained stable throughout.       Balance Overall balance assessment: Needs assistance Sitting-balance support: Feet supported;Bilateral upper extremity supported Sitting balance-Leahy Scale: Poor Sitting balance - Comments: min assist seated EOB to prevent LOB   Standing balance support: Bilateral upper extremity supported Standing balance-Leahy Scale: Poor Standing balance comment: bil hand held assist.                     Cognition Arousal/Alertness: Awake/alert Behavior During Therapy: WFL for tasks assessed/performed Overall Cognitive Status: Difficult to assess Area of Impairment: Following commands       Following Commands: Follows one step commands inconsistently       General Comments: Pt awake, alert, not restless, following some basic commands.  Niece in room.           Pertinent Vitals/Pain Pain Assessment: Faces Faces Pain Scale: Hurts a little bit Pain Location: turnk Pain Descriptors / Indicators: Grimacing;Guarding Pain Intervention(s): Limited activity within patient's tolerance;Monitored during session;Repositioned    03/22/15 1006  Vital Signs (pre-session)  Pulse Rate 88  Resp (!) 21  BP (!) 111/56 mmHg  BP Location Right Arm  BP Method Automatic  Patient Position (if appropriate) Lying  Oxygen Therapy  SpO2 98 %  O2 Device Ventilator     03/22/15 1036  Vital Signs (seated EOB)  BP (!) 115/50 mmHg (MAP65)  BP Location Right Arm  BP Method Automatic  Patient Position (if appropriate) Sitting  03/22/15 1053  Vital Signs (post-session in recliner)  BP (!) 111/47 mmHg  BP Location Right Arm  BP Method Automatic  Patient Position (if appropriate) Sitting          PT Goals (current goals can now be found in the care plan section) Acute Rehab PT Goals Patient Stated Goal: None stated Progress towards PT goals: Progressing  toward goals    Frequency  Min 2X/week    PT Plan Current plan remains appropriate       End of Session Equipment Utilized During Treatment: Oxygen;Other (comment) (ventilator) Activity Tolerance: Patient limited by fatigue Patient left: in chair;with chair alarm set;with family/visitor present;Other (comment) (asked visitor to let RN know when she leaves)     Time: 1610-9604 PT Time Calculation (min) (ACUTE ONLY): 39 min  Charges:  $Therapeutic Activity: 38-52 mins            Jahaira Earnhart B. Lamontae Ricardo, PT, DPT [phone]   03/22/2015, 11:06 AM

## 2015-03-22 NOTE — Care Management Note (Signed)
Case Management Note  Patient Details  Name: DENAJAH FARIAS MRN: [id number] Date of Birth: [birth year]  Subjective/Objective:      Unable to get PLEX yesterday due to unstability - receiving 3-5 today.  Remains on vent.               Action/Plan:   Expected Discharge Date:                  Expected Discharge Plan:  Home w Home Health Services  In-House Referral:     Discharge planning Services  CM Consult  Post Acute Care Choice:    Choice offered to:     DME Arranged:    DME Agency:     HH Arranged:    HH Agency:     Status of Service:  In process, will continue to follow  Medicare Important Message Given:    Date Medicare IM Given:    Medicare IM give by:    Date Additional Medicare IM Given:    Additional Medicare Important Message give by:     If discussed at Long Length of Stay Meetings, dates discussed:    Additional Comments:  Vangie Bicker, RN 03/22/2015, 9:17 AM

## 2015-03-22 NOTE — Progress Notes (Signed)
TPE- Arrived to pt room bp 108/50. While setting up machine pt bp dropped to 84/39 and remained in 80s-90s systolic. Neurologist paged.

## 2015-03-22 NOTE — Progress Notes (Signed)
NIF -20, VC 480

## 2015-03-23 ENCOUNTER — Inpatient Hospital Stay (HOSPITAL_COMMUNITY): Payer: Commercial Managed Care - HMO

## 2015-03-23 DIAGNOSIS — R011 Cardiac murmur, unspecified: Secondary | ICD-10-CM

## 2015-03-23 DIAGNOSIS — E43 Unspecified severe protein-calorie malnutrition: Secondary | ICD-10-CM

## 2015-03-23 LAB — CBC
HEMATOCRIT: 22 % — AB (ref 36.0–46.0)
HEMOGLOBIN: 7.1 g/dL — AB (ref 12.0–15.0)
MCH: 30.5 pg (ref 26.0–34.0)
MCHC: 32.3 g/dL (ref 30.0–36.0)
MCV: 94.4 fL (ref 78.0–100.0)
Platelets: 165 10*3/uL (ref 150–400)
RBC: 2.33 MIL/uL — AB (ref 3.87–5.11)
RDW: 15.7 % — ABNORMAL HIGH (ref 11.5–15.5)
WBC: 12.2 10*3/uL — ABNORMAL HIGH (ref 4.0–10.5)

## 2015-03-23 LAB — GLUCOSE, CAPILLARY
Glucose-Capillary: 131 mg/dL — ABNORMAL HIGH (ref 65–99)
Glucose-Capillary: 131 mg/dL — ABNORMAL HIGH (ref 65–99)

## 2015-03-23 LAB — BASIC METABOLIC PANEL
Anion gap: 6 (ref 5–15)
BUN: 22 mg/dL — AB (ref 6–20)
CHLORIDE: 105 mmol/L (ref 101–111)
CO2: 28 mmol/L (ref 22–32)
CREATININE: 0.79 mg/dL (ref 0.44–1.00)
Calcium: 8.3 mg/dL — ABNORMAL LOW (ref 8.9–10.3)
GFR calc Af Amer: >60 mL/min (ref >60)
GFR calc non Af Amer: >60 mL/min (ref >60)
GLUCOSE: 146 mg/dL — AB (ref 65–99)
POTASSIUM: 3.2 mmol/L — AB (ref 3.5–5.1)
Sodium: 139 mmol/L (ref 135–145)

## 2015-03-23 LAB — ABO/RH: ABO/RH(D): O POS

## 2015-03-23 LAB — PREPARE RBC (CROSSMATCH)

## 2015-03-23 MED ORDER — POTASSIUM CHLORIDE 20 MEQ/15ML (10%) PO SOLN
30.0000 meq | ORAL | Status: AC
  Administered 2015-03-23 (×2): 30 meq
  Filled 2015-03-23 (×2): qty 30

## 2015-03-23 MED ORDER — FENTANYL CITRATE (PF) 100 MCG/2ML IJ SOLN
25.0000 ug | INTRAMUSCULAR | Status: DC | PRN
  Administered 2015-03-23 – 2015-03-24 (×3): 50 ug via INTRAVENOUS
  Filled 2015-03-23 (×3): qty 2

## 2015-03-23 MED ORDER — ALPRAZOLAM 0.25 MG PO TABS
0.2500 mg | ORAL_TABLET | Freq: Two times a day (BID) | ORAL | Status: DC
  Administered 2015-03-23 – 2015-03-27 (×8): 0.25 mg via ORAL
  Filled 2015-03-23 (×9): qty 1

## 2015-03-23 MED ORDER — FENTANYL CITRATE (PF) 100 MCG/2ML IJ SOLN
25.0000 ug | INTRAMUSCULAR | Status: DC | PRN
  Administered 2015-03-23: 25 ug via INTRAVENOUS
  Administered 2015-03-23: 50 ug via INTRAVENOUS
  Administered 2015-03-23: 25 ug via INTRAVENOUS
  Filled 2015-03-23 (×2): qty 2

## 2015-03-23 MED ORDER — FUROSEMIDE 10 MG/ML IJ SOLN
40.0000 mg | Freq: Once | INTRAMUSCULAR | Status: AC
  Administered 2015-03-23: 40 mg via INTRAVENOUS
  Filled 2015-03-23: qty 4

## 2015-03-23 MED ORDER — LORAZEPAM 2 MG/ML IJ SOLN
1.0000 mg | INTRAMUSCULAR | Status: DC | PRN
  Administered 2015-03-23: 1 mg via INTRAVENOUS

## 2015-03-23 MED ORDER — HALOPERIDOL LACTATE 5 MG/ML IJ SOLN
1.0000 mg | INTRAMUSCULAR | Status: DC | PRN
  Administered 2015-03-24 – 2015-03-29 (×2): 4 mg via INTRAVENOUS
  Filled 2015-03-23 (×2): qty 1

## 2015-03-23 MED ORDER — SODIUM CHLORIDE 0.9 % IV SOLN
Freq: Once | INTRAVENOUS | Status: AC
  Administered 2015-03-23: 14:00:00 via INTRAVENOUS

## 2015-03-23 NOTE — Progress Notes (Signed)
NIF -20 VC 400

## 2015-03-23 NOTE — Progress Notes (Signed)
[facility] ADULT ICU REPLACEMENT PROTOCOL FOR AM LAB REPLACEMENT ONLY  The patient does apply for the [facility] Adult ICU Electrolyte Replacment Protocol based on the criteria listed below:   1. Is GFR >/= 40 ml/min? Yes.    Patient's GFR today is >60 2. Is urine output >/= 0.5 ml/kg/hr for the last 6 hours? Yes.   Patient's UOP is 0.5 ml/kg/hr 3. Is BUN < 60 mg/dL? Yes.    Patient's BUN today is 22 4. Abnormal electrolyte(s): K 3.2 5. Ordered repletion with: per protocol 6. If a panic level lab has been reported, has the CCM MD in charge been notified? No..   Physician:    Markus Daft A 03/23/2015 4:05 AM

## 2015-03-23 NOTE — Progress Notes (Signed)
  Echocardiogram 2D Echocardiogram has been performed.  Janalyn Harder 03/23/2015, 1:39 PM

## 2015-03-23 NOTE — Progress Notes (Signed)
NEURO HOSPITALIST PROGRESS NOTE   SUBJECTIVE:                                                                                                                        She remains intubated on the vent but awake and following commands. Increased agitation today. Completed course of PLEX yesterday without complications.   OBJECTIVE:                                                                                                                           Vital signs in last 24 hours: Temp:  [97 F (36.1 C)-99.8 F (37.7 C)] 97.8 F (36.6 C) (10/11 0754) Pulse Rate:  [53-88] 68 (10/11 0700) Resp:  [12-22] 17 (10/11 0700) BP: (69-137)/(23-113) 114/61 mmHg (10/11 0700) SpO2:  [92 %-100 %] 99 % (10/11 0700) FiO2 (%):  [30 %] 30 % (10/11 0700) Weight:  [61.2 kg (134 lb 14.7 oz)] 61.2 kg (134 lb 14.7 oz) (10/11 0457)  Intake/Output from previous day: 10/10 0701 - 10/11 0700 In: 1575.7 [I.V.:305.7; NG/GT:1140; IV Piggyback:100] Out: 620 [Urine:620] Intake/Output this shift:   Nutritional status:    Past Medical History  Diagnosis Date  . Hypertension   . Hypercholesteremia   . Stroke [facility])   Physical exam:  Constitutional: elderly female, intubated on the vent, in no apparent distress. Eyes: no jaundice or exophthalmos.  Head: normocephalic. Neck: supple, no bruits, no JVD. Cardiac: no murmurs. Lungs: clear. Abdomen: soft, no tender, no mass. Extremities: no edema, clubbing, or cyanosis.  Skin: no rash  Neurologic Exam:  General: NAD Mental Status: Intubated on the vent but alert and awake and following some commands.  Cranial Nerves: II:  Visual fields grossly normal, pupils equal, round, reactive to light  III,IV, VI: mild left ptosis, extra-ocular motions intact bilaterally V,VII: face symmetric Motor: Moves all limbs symmetrically Sensory: light touch intact throughout, bilaterally  Lab Results: Lab Results  Component Value  Date/Time   CHOL 131 03/10/2015 08:35 PM   CHOL 154 12/17/2013 03:26 PM   Lipid Panel No results for input(s): CHOL, TRIG, HDL, CHOLHDL, VLDL, LDLCALC in the last 72 hours.  Studies/Results: Dg Chest Port 1 View  03/23/2015   CLINICAL DATA:  Intubation.  EXAM:  PORTABLE CHEST 1 VIEW  COMPARISON:  03/22/2015.  FINDINGS: Endotracheal tube tip new 2.2 cm above the carina. Right IJ line, feeding tube in stable position. Cardiomegaly with persistent diffuse alveolar infiltrates and pleural effusions consistent with persistent congestive heart failure. No interim change. Evidence of a hiatal hernia. No pneumothorax.  IMPRESSION: 1. Endotracheal tube tip 2.2 cm above the carina . Remaining lines and tubes in stable position. 2. Persistent cardiomegaly with bilateral pulmonary alveolar infiltrates and pleural effusions consistent with congestive heart failure. No interim change. 3. Prominent hiatal hernia.   Electronically Signed   By: Maisie Fus  Register   On: 03/23/2015 07:27   Dg Chest Port 1 View  03/22/2015   CLINICAL DATA:  Hypoxia.  Myasthenia gravis  EXAM: PORTABLE CHEST 1 VIEW  COMPARISON:  March 21, 2015  FINDINGS: Endotracheal tube tip remains at the carina, directed toward the right main bronchus, unchanged from 1 day prior. Feeding tube tip is in the stomach. Central catheter tip is in the superior vena cava. No pneumothorax. There are bilateral pleural effusions with mild interstitial edema. There is patchy airspace consolidation the left base. Heart is mildly enlarged with pulmonary vascular within normal limits. No adenopathy. There is calcification in the mitral annulus.  IMPRESSION: Tube positions as described without pneumothorax. Note that the endotracheal tube tip is at the carina directed toward the right main bronchus, stable. It may be prudent to consider withdrawing the endotracheal tube 2-3 cm in this regard.  There is evidence of a degree of congestive heart failure. Superimposed  pneumonia in the left base cannot be excluded.   Electronically Signed   By: Bretta Bang III M.D.   On: 03/22/2015 07:14    MEDICATIONS                                                                                                                        Scheduled: . antiseptic oral rinse  7 mL Mouth Rinse QID  . calcium gluconate  2 g Intravenous Once  . chlorhexidine gluconate  15 mL Mouth Rinse BID  . fentaNYL (SUBLIMAZE) injection  50 mcg Intravenous Once  . heparin  1,000 Units Intracatheter Once  . heparin  5,000 Units Subcutaneous 3 times per day  . hydroxypropyl methylcellulose / hypromellose  1 drop Both Eyes TID  . latanoprost  1 drop Right Eye QHS  . meropenem (MERREM) IV  1 g Intravenous Q12H  . pantoprazole sodium  40 mg Per Tube Daily    ASSESSMENT/PLAN:  81 y/o transferred to [facility] for further management of likely MG crisis with respiratory failure. Patient is intubated on the vent but alert, awake and able to follow commands. Hospital course complicated by brief arrest.   -completed 3/5 PLEX, plan 4th round tomorrow -weaning trial/extubation as per critical care attending. -would consider re-introducing mestinon when extubated -will continue to follow  Elspeth Cho, DO [facility] [phone]  If 7pm- 7am, please page neurology on call as listed in AMION.   03/23/2015, 8:07 AM

## 2015-03-23 NOTE — Progress Notes (Signed)
PULMONARY / CRITICAL CARE MEDICINE   Name: Cheyenne Boone MRN: [id number] DOB: Oct 18, 1923    ADMISSION DATE:  03/15/2015 CONSULTATION DATE:  03/15/2015  REFERRING MD :  Jamison Neighbor  CHIEF COMPLAINT:  Dysphagia  INITIAL PRESENTATION: 81 year old female with 10 day history of dysphagia. Was admitted to [facility] for what was suspected to be myasthenia gravis flare. She was started on on IVIG and mestinon, however eventually required intubation. Transferred to [facility] at family request and for possible PLEX. PCCM to admit.   STUDIES:  9/27 CT head - Stable noncontrast CT appearance of the brain since May. No acute intracranial abnormality  9/27 Barium swallow - Nondiagnostic barium swallow examination due to the patient's inability to more than tiny amounts of the barium, Possible mass in the AP window. Possible infrahilar pneumonia on the left.  9/27 CT chest - Significant coronary artery disease, mitral annulus and aortic valve disease. Tortuous aorta without aneurysm. Large hiatal hernia. No associated obstruction.  9/27 CT soft tissue neck - Moderate calcified plaque is noted in proximal right internal carotid artery; carotid ultrasound is recommended for further evaluation. Fluid is noted in the right mastoid air cells. Clinical correlation is recommended to rule out mastoiditis.  9/28 MRI brain - No acute intracranial abnormality. Chronic small vessel ischemic disease is stable since 2015. Chronic cervical spine degeneration with C3-C4 and C4-C5 spinal stenosis which does not appear significantly progressed since 2015.  9/28 Carotid doppler - Right carotid bifurcation and proximal ICA plaque, resulting in less than 50% diameter stenosis. The exam does not exclude plaque ulceration or embolization.  SIGNIFICANT EVENTS: 9/27 - Admit for dysphagia  9/29 - Transfer to ICU @ [facility] for respiratory failure & intubated>>Bronchoscopy with bilateral secretions & plugging 10/01 - Extubated 10/03 -  Reintubated 10/03 - Transfer to [facility] from [facility] 10/4- HD cath placed 10/5 PLEX planned, EGD for OGT placement. PLEX stopped due to hypotension 10/7- concern for allergic rxn to UNasyn or plex citrate, improved with treatment 10/8- delirium severe--> precedex-->PEA short arrest--> woke up  SUBJECTIVE:  Intubated, Awake, alert, follows commands.   VITAL SIGNS: Temp:  [97 F (36.1 C)-100.5 F (38.1 C)] 99.2 F (37.3 C) (10/11 0336) Pulse Rate:  [53-88] 68 (10/11 0700) Resp:  [12-22] 17 (10/11 0700) BP: (69-144)/(23-113) 114/61 mmHg (10/11 0700) SpO2:  [92 %-100 %] 99 % (10/11 0700) FiO2 (%):  [30 %] 30 % (10/11 0700) Weight:  [134 lb 14.7 oz (61.2 kg)] 134 lb 14.7 oz (61.2 kg) (10/11 0457) HEMODYNAMICS:   VENTILATOR SETTINGS: Vent Mode:  [-] PRVC FiO2 (%):  [30 %] 30 % Set Rate:  [12 bmp] 12 bmp Vt Set:  [400 mL] 400 mL PEEP:  [5 cmH20] 5 cmH20 Pressure Support:  [14 cmH20] 14 cmH20 Plateau Pressure:  [16 cmH20-18 cmH20] 16 cmH20 INTAKE / OUTPUT:  Intake/Output Summary (Last 24 hours) at 03/23/15 0716 Last data filed at 03/23/15 0700  Gross per 24 hour  Intake 1575.66 ml  Output    620 ml  Net 955.66 ml    PHYSICAL EXAMINATION: General: Elderly female on vent, strength improving Neuro: Alert, follows commands, moves all ext equally HEENT: no swelling Cardiovascular: s1 s2 RRR Lungs: CTA, reduced rt base Abdomen: Soft, non-tender, non-distended Musculoskeletal: edema feet Skin: Grossly intact  LABS:  CBC  Recent Labs Lab 03/21/15 0502 03/22/15 0500 03/22/15 1254 03/23/15 0335  WBC 22.2* 9.7  --  12.2*  HGB 9.0* 8.0* 7.1* 7.1*  HCT 27.0* 24.8* 21.0* 22.0*  PLT 217 163  --  165   Coag's No results for input(s): APTT, INR in the last 168 hours. BMET  Recent Labs Lab 03/21/15 0502 03/22/15 0500 03/22/15 1254 03/23/15 0335  NA 140 138 139 139  K 3.5 3.5 3.3* 3.2*  CL 105 101 99* 105  CO2 27 31  --  28  BUN 22*   CREATININE 0.87 0.86 0.90 0.79  GLUCOSE 158* 123* 143* 146*   Electrolytes  Recent Labs Lab 03/20/15 0340 03/20/15 1707 03/20/15 2134 03/21/15 0502 03/22/15 0500 03/23/15 0335  CALCIUM 8.0*  --  12.5* 9.0 8.4* 8.3*  MG 1.6* 2.8* 2.6* 2.4  --   --   PHOS 2.9 2.8  --  3.1  --   --    Sepsis Markers No results for input(s): LATICACIDVEN, PROCALCITON, O2SATVEN in the last 168 hours. ABG  Recent Labs Lab 03/18/15 0425  PHART 7.398  PCO2ART 33.9*  PO2ART 150*   Liver Enzymes  Recent Labs Lab 03/17/15 0438  AST 23  ALT 20  ALKPHOS 75  BILITOT 1.0  ALBUMIN 2.2*   Cardiac Enzymes  Recent Labs Lab 03/20/15 2134 03/21/15 0501 03/21/15 0905  TROPONINI <0.03 <0.03 <0.03   Glucose  Recent Labs Lab 03/21/15 2015 03/21/15 2353 03/22/15 0754 03/22/15 1952 03/23/15 0002 03/23/15 0330  GLUCAP 118* 111* 102* 157* 131* 131*    Imaging No results found.   ASSESSMENT / PLAN:  PULMONARY OETT 9/29 > 10/1, 10/3 >>> A: Acute respiratory failure in setting myasthenia gravis flare Mucus plugging - S/P bronch & BAL 9/29 pcxr 10/7 worsening atx rt base, improved 10/8  P:  PLEX 3/5 yesterday, plan for 4th session tomorrow NIF / VC monitoring Continue to monitor secretions  CARDIOVASCULAR CVL LIJ 10/3 > A:  H/O HTN H/O HLD Drop in BP with one of plex treatment PEA arrest 10/8 secondary to precedex, r/o AS, occult valvular dz P:  Echo pending Tele Lasix  IV x1  RENAL A:  Acute Renal Failure - resolved Hypokalemia hypomag - resolved P:  Even to pos balance Replete K Follow BMP  GASTROINTESTINAL A:  Large hiatal hernia Dysphagia Difficult NGT placement P:  Protonix IV daily for prophylaxis Tube feeds  HEMATOLOGIC A:  DVT prevention Leukocytosis post arrest - likely demargination Normocytic Anemia - Likely related to PLEX P:  Heparin for VTE prophylaxis SCDs Transfuse 1U pRBC today  INFECTIOUS A:  Leukocytosis  in setting oral steroids Aspiration PNA - H parahaemolytics on BAL Concern for unasyn allergy P:  BCx2 9/29 >>> No growth BAL 9/29 - H parahemolyticus UC - neg Respiratory Culture 10/10 >>>  Abx:  Unasyn, start date 10/04>>>10/7 Zosyn, start date 9/29 >>>10/04 Vancomycin, start date 9/29 >>>10/04 Meropenem 10/7>>>  CXR in AM Consider stopping abx tomorrow   ENDOCRINE A:  Hyperglycemia  P:  Accuchecks q4hr Glu wnl  NEUROLOGIC A:  Myasthenia gravis crisis Agitation P:  RASS goal: 0 Fentanyl infusion Plex 4/5 tomorrow Nif/ vc monitoring WUA Restart home Xanax Start Haldol  FAMILY  - Updates: daughter updated by DF 10/7  - Inter-disciplinary family meet or Palliative Care meeting due by: 10/6, DONE by DF 10/9 =  I have had extensive discussions with family daughter. We discussed patients current circumstances and organ failures. We also discussed patient's prior wishes under circumstances such as this. Family has decided to NOT perform resuscitation if arrest but to continue current medical support for now. Also dni with planned extubation.  Katina Degree. Jimmey Ralph, MD [facility] Family Medicine Resident PGY-2 03/23/2015 7:16 AM

## 2015-03-23 NOTE — Progress Notes (Addendum)
ANTIBIOTIC CONSULT NOTE   Pharmacy Consult for Merrem  Indication: Aspiration PNA  Allergies  Allergen Reactions  . Precedex [Dexmedetomidine Hcl In Nacl] Other (See Comments)    PEA arrest  . Citrate     Swelling tongue    Patient Measurements: Height:  (157.5 cm) Weight: 134 lb 14.7 oz (61.2 kg) IBW/kg (Calculated) : 50.1   Vital Signs: Temp: 97.8 F (36.6 C) (10/11 0754) Temp Source: Oral (10/11 0754) BP: 144/107 mmHg (10/11 1100) Pulse Rate: 102 (10/11 1100) Intake/Output from previous day: 10/10 0701 - 10/11 0700 In: 1575.7 [I.V.:305.7; NG/GT:1140; IV Piggyback:100] Out: 620 [Urine:620] Intake/Output from this shift: Total I/O In: 340 [I.V.:30; NG/GT:210; IV Piggyback:100] Out: 395 [Urine:395]  Labs:  Recent Labs  03/21/15 0502 03/22/15 0500 03/22/15 1254 03/23/15 0335  WBC 22.2* 9.7  --  12.2*  HGB 9.0* 8.0* 7.1* 7.1*  PLT 217 163  --  165  CREATININE 0.87 0.86 0.90 0.79   Estimated Creatinine Clearance: 41 mL/min (by C-G formula based on Cr of 0.79). No results for input(s): VANCOTROUGH, VANCOPEAK, VANCORANDOM, GENTTROUGH, GENTPEAK, GENTRANDOM, TOBRATROUGH, TOBRAPEAK, TOBRARND, AMIKACINPEAK, AMIKACINTROU, AMIKACIN in the last 72 hours.   Microbiology: Recent Results (from the past 720 hour(s))  Culture, blood (routine x 2)     Status: None   Collection Time: 03/11/15  1:23 PM  Result Value Ref Range Status   Specimen Description BLOOD RIGHT ARM  Final   Special Requests BOTTLES DRAWN AEROBIC AND ANAEROBIC 2CC  Final   Culture NO GROWTH 6 DAYS  Final   Report Status 03/17/2015 FINAL  Final  Culture, blood (routine x 2)     Status: None   Collection Time: 03/11/15  1:23 PM  Result Value Ref Range Status   Specimen Description BLOOD LEFT ASSIST CONTROL  Final   Special Requests   Final    BOTTLES DRAWN AEROBIC AND ANAEROBIC 4CC ANAERO 7CC AERO   Culture NO GROWTH 6 DAYS  Final   Report Status 03/17/2015 FINAL  Final  MRSA PCR Screening      Status: None   Collection Time: 03/11/15  2:00 PM  Result Value Ref Range Status   MRSA by PCR  NEGATIVE Final    RESULTS INVALID. NOTIFIED JAMIE SMITH GREGORY AT 1730 FOR RECOLLECT MSS  Culture, bal-quantitative     Status: None   Collection Time: 03/11/15  6:45 PM  Result Value Ref Range Status   Specimen Description BRONCHIAL ALVEOLAR LAVAGE  Final   Special Requests Normal  Final   Gram Stain   Final    FEW WBC SEEN MODERATE GRAM POSITIVE COCCI FEW GRAM NEGATIVE RODS GOOD SPECIMEN - 80-90% WBCS    Culture LIGHT GROWTH HAEMOPHILUS PARAHAEMOLYTICUS  Final   Report Status 03/15/2015 FINAL  Final  MRSA PCR Screening     Status: None   Collection Time: 03/15/15 10:56 PM  Result Value Ref Range Status   MRSA by PCR NEGATIVE NEGATIVE Final    Comment:        The GeneXpert MRSA Assay (FDA approved for NASAL specimens only), is one component of a comprehensive MRSA colonization surveillance program. It is not intended to diagnose MRSA infection nor to guide or monitor treatment for MRSA infections.   Culture, respiratory (NON-Expectorated)     Status: None (Preliminary result)   Collection Time: 03/22/15 12:56 PM  Result Value Ref Range Status   Specimen Description TRACHEAL ASPIRATE  Final   Special Requests Normal  Final   Gram Stain  Final    FEW WBC PRESENT,BOTH PMN AND MONONUCLEAR NO SQUAMOUS EPITHELIAL CELLS SEEN NO ORGANISMS SEEN Performed at [employer] [facility]    Culture PENDING  Incomplete   Report Status PENDING  Incomplete    Medical History: Past Medical History  Diagnosis Date  . Hypertension   . Hypercholesteremia   . Stroke [facility])    Assessment: 81 y/o tx from [facility], has been on Vanco/Zosyn at [facility] then switched to Unasyn for aspiration PNA which was stopped on 10/5. Restarted Unasyn therapy due to worsening chest x ray and rec'd 2 doses when pt began to c/o diffuse itching w/concern for allergic rxn to abx. Changed to Merrem. WBC slightly elevated  12.2, Pt is afebrile. CrCl ~ 35-40 mL/min.  Plan:  - Merrem 1g IV Q12H (expected stop date 03/24/15) - Monitor CBC, Cx, renal fxn  Sherron Monday, PharmD Clinical Pharmacy Resident Pager: [phone] 03/23/2015 11:10 AM

## 2015-03-23 NOTE — Progress Notes (Signed)
Spoke with Dr. Rudene Anda about patient's agitation and pain.  PRN orders for fentanyl instead of restarting fentanyl gtt.

## 2015-03-23 NOTE — Progress Notes (Signed)
Patient is lethargic to complete NIF and VC at this time.

## 2015-03-23 NOTE — Progress Notes (Signed)
Patient was given  of fentanyl and 1 mg of ativan; patient has been continually agitated and trying to pull at ETT.

## 2015-03-24 ENCOUNTER — Inpatient Hospital Stay (HOSPITAL_COMMUNITY): Payer: Commercial Managed Care - HMO

## 2015-03-24 LAB — BASIC METABOLIC PANEL
Anion gap: 9 (ref 5–15)
BUN: 23 mg/dL — AB (ref 6–20)
CALCIUM: 8.2 mg/dL — AB (ref 8.9–10.3)
CO2: 32 mmol/L (ref 22–32)
CREATININE: 0.85 mg/dL (ref 0.44–1.00)
Chloride: 99 mmol/L — ABNORMAL LOW (ref 101–111)
GFR calc non Af Amer: 59 mL/min — ABNORMAL LOW (ref >60)
Glucose, Bld: 139 mg/dL — ABNORMAL HIGH (ref 65–99)
Potassium: 3.4 mmol/L — ABNORMAL LOW (ref 3.5–5.1)
SODIUM: 140 mmol/L (ref 135–145)

## 2015-03-24 LAB — TYPE AND SCREEN
ABO/RH(D): O POS
ANTIBODY SCREEN: NEGATIVE
UNIT DIVISION: 0

## 2015-03-24 LAB — CBC
HCT: 24.5 % — ABNORMAL LOW (ref 36.0–46.0)
Hemoglobin: 8.1 g/dL — ABNORMAL LOW (ref 12.0–15.0)
MCH: 30.7 pg (ref 26.0–34.0)
MCHC: 33.1 g/dL (ref 30.0–36.0)
MCV: 92.8 fL (ref 78.0–100.0)
PLATELETS: 185 10*3/uL (ref 150–400)
RBC: 2.64 MIL/uL — AB (ref 3.87–5.11)
RDW: 16.4 % — AB (ref 11.5–15.5)
WBC: 11.6 10*3/uL — ABNORMAL HIGH (ref 4.0–10.5)

## 2015-03-24 LAB — GLUCOSE, CAPILLARY
GLUCOSE-CAPILLARY: 119 mg/dL — AB (ref 65–99)
Glucose-Capillary: 127 mg/dL — ABNORMAL HIGH (ref 65–99)
Glucose-Capillary: 110 mg/dL — ABNORMAL HIGH (ref 65–99)

## 2015-03-24 LAB — MAGNESIUM: MAGNESIUM: 1.8 mg/dL (ref 1.7–2.4)

## 2015-03-24 LAB — ACETYLCHOLINE RECEPTOR, BINDING: Acety choline binding ab: 7.93 nmol/L — ABNORMAL HIGH (ref 0.00–0.24)

## 2015-03-24 LAB — STRIATED MUSCLE ANTIBODY: Anti-striation Abs: 1:80 {titer} — ABNORMAL HIGH

## 2015-03-24 MED ORDER — HEPARIN SODIUM (PORCINE) 1000 UNIT/ML IJ SOLN: 1000.0000 [IU] | Freq: Once | INTRAMUSCULAR | Status: DC

## 2015-03-24 MED ORDER — DIPHENHYDRAMINE HCL 25 MG PO CAPS: 25.0000 mg | ORAL_CAPSULE | Freq: Four times a day (QID) | ORAL | Status: DC | PRN

## 2015-03-24 MED ORDER — SODIUM CHLORIDE 0.9 % IV SOLN
Freq: Once | INTRAVENOUS | Status: DC
  Filled 2015-03-24: qty 200

## 2015-03-24 MED ORDER — ACETAMINOPHEN 325 MG PO TABS: 650.0000 mg | ORAL_TABLET | ORAL | Status: DC | PRN

## 2015-03-24 MED ORDER — FUROSEMIDE 10 MG/ML IJ SOLN
40.0000 mg | Freq: Once | INTRAMUSCULAR | Status: AC
  Administered 2015-03-24: 40 mg via INTRAVENOUS
  Filled 2015-03-24: qty 4

## 2015-03-24 MED ORDER — SODIUM CHLORIDE 0.9 % IV SOLN
4.0000 g | Freq: Once | INTRAVENOUS | Status: DC
  Filled 2015-03-24: qty 40

## 2015-03-24 MED ORDER — SODIUM CHLORIDE 0.9 % IV SOLN
INTRAVENOUS | Status: AC
  Filled 2015-03-24 (×3): qty 200

## 2015-03-24 MED ORDER — CALCIUM CARBONATE ANTACID 500 MG PO CHEW
2.0000 | CHEWABLE_TABLET | ORAL | Status: DC
  Filled 2015-03-24 (×2): qty 2

## 2015-03-24 MED ORDER — ACD FORMULA A 0.73-2.45-2.2 GM/100ML VI SOLN
500.0000 mL | Status: DC
  Filled 2015-03-24: qty 500

## 2015-03-24 MED ORDER — VITAL AF 1.2 CAL PO LIQD
1000.0000 mL | ORAL | Status: DC
  Administered 2015-03-24 (×2): 1000 mL
  Filled 2015-03-24 (×3): qty 1000

## 2015-03-24 MED ORDER — SODIUM CHLORIDE 0.9 % IV SOLN
INTRAVENOUS | Status: DC
  Filled 2015-03-24 (×3): qty 200

## 2015-03-24 MED ORDER — POTASSIUM CHLORIDE 20 MEQ/15ML (10%) PO SOLN
40.0000 meq | Freq: Once | ORAL | Status: AC
  Administered 2015-03-24: 40 meq via ORAL
  Filled 2015-03-24: qty 30

## 2015-03-24 NOTE — Progress Notes (Signed)
Nutrition Follow-up  DOCUMENTATION CODES:   Severe malnutrition in context of chronic illness  INTERVENTION:   UpdateTF via enteral feeding tube, Vital AF 1.2 to goal rate of 45 ml/h (1080 ml per day) to provide 1296 kcals, 81 gm protein, 876 ml free water daily.  Continue to monitor potassium daily MD to replete as needed, as pt is at risk for refeeding syndrome given minimal intake x 10 days and severe PCM.  RD team to continue to monitor for tube feeding tolerance and needs.   NUTRITION DIAGNOSIS:   Malnutrition related to chronic illness, dysphagia as evidenced by severe depletion of muscle mass, percent weight loss.  Ongoing.   GOAL:   Patient will meet greater than or equal to 90% of their needs  Progressing.  MONITOR:   Vent status, Labs, Weight trends, TF tolerance, I & O's, Diet advancement, Skin  REASON FOR ASSESSMENT:   Consult, Ventilator Enteral/tube feeding initiation and management  ASSESSMENT:   81 year old female with 10 day history of dysphagia. [facility] for what was suspected to be myasthenia gravis flare. She was started on IVIG and mestinon, however eventually required intubation. [facility]   Patient alert and appeared aggitated during visit for tube feeding follow-up. Tube feeding Vital 1.2 is currently achieving previous goal rate of 40 mL/hr.However, patient's estimated needs have increased as MV has changed. Tube feeding order has been updated to new goal rate of 45 mL/hr.   Patient is currently being observed for refeeding risk due to 10 day dysphagia history with minimal oral intake and severe PCM. [facility] for electrolyte repletion. Per chart, low phosphorus and low magnesium have been resolved. Yet, per patient's labs, low potassium remains.   Patient continues to meet criteria for severe malnutrition in chronic illness.    Patient is currently intubated on ventilator support.  MV: 8.7 L/min Temp (24hrs), Avg:99 F (37.2 C), Min:98.1 F (36.7 C), Max:99.4 F (37.4 C)  Medications reviewed. Protonix IV daily, Lasix.   Labs reviewed: high CBG (127-157), low K (3.4)   Diet Order:     Skin:  Reviewed, no issues  Last BM:  unknown  Height:   Ht Readings from Last 1 Encounters:  03/15/15 5\' 2"  (1.575 m)    Weight:   Wt Readings from Last 1 Encounters:  03/24/15 133 lb 6.1 oz (60.5 kg)    Ideal Body Weight:  50 kg  BMI:  Body mass index is 24.39 kg/(m^2).  Estimated Nutritional Needs:   Kcal:  1093  Protein:  75-85 gm  Fluid:  1.5 L  EDUCATION NEEDS:   No education needs identified at this time  Delano MetzMaggie Gartman, OregonBA., BS Dietetic Intern 03/24/2015 9:28 AM   I agree with student dietitian note; appropriate revisions have been made.  Joaquin CourtsKimberly Bakari Nikolai, RD, LDN, CNSC Pager# [phone] After Hours Pager# [phone]

## 2015-03-24 NOTE — Progress Notes (Signed)
PULMONARY / CRITICAL CARE MEDICINE   Name: Cheyenne Boone MRN: [id number] DOB: 08/07/1923    ADMISSION DATE:  03/15/2015 CONSULTATION DATE:  03/15/2015  REFERRING MD :  Jamison NeighborNestor  CHIEF COMPLAINT:  Dysphagia  INITIAL PRESENTATION: 81 year old female with 10 day history of dysphagia. [facility] for what was suspected to be myasthenia gravis flare. She was started on on IVIG and mestinon, however eventually required intubation. Transferred to [facility] at family request and for possible PLEX. PCCM to admit.   STUDIES:  9/27 CT head - Stable noncontrast CT appearance of the brain since May. No acute intracranial abnormality  9/27 Barium swallow - Nondiagnostic barium swallow examination due to the patient's inability to more than tiny amounts of the barium, Possible mass in the AP window. Possible infrahilar pneumonia on the left.  9/27 CT chest - Significant coronary artery disease, mitral annulus and aortic valve disease. Tortuous aorta without aneurysm. Large hiatal hernia. No associated obstruction.  9/27 CT soft tissue neck - Moderate calcified plaque is noted in proximal right internal carotid artery; carotid ultrasound is recommended for further evaluation. Fluid is noted in the right mastoid air cells. Clinical correlation is recommended to rule out mastoiditis.  9/28 MRI brain - No acute intracranial abnormality. Chronic small vessel ischemic disease is stable since 2015. Chronic cervical spine degeneration with C3-C4 and C4-C5 spinal stenosis which does not appear significantly progressed since 2015.  9/28 Carotid doppler - Right carotid bifurcation and proximal ICA plaque, resulting in less than 50% diameter stenosis. The exam does not exclude plaque ulceration or embolization.  10/12 Echo - EF 60-65%, no AS, grade 1 diastolic dysfunction  SIGNIFICANT EVENTS: 9/27 - Admit for dysphagia  9/29 - [facility] for [facility] failure & intubated>>Bronchoscopy with  bilateral secretions & plugging 10/01 - Extubated 10/03 - Reintubated 10/03 - [facility]4- HD cath placed 10/5 PLEX planned, EGD for OGT placement. PLEX stopped due to hypotension 10/7- concern for allergic rxn to UNasyn or plex citrate, improved with treatment 10/8- delirium severe--> precedex-->PEA short arrest--> woke up  SUBJECTIVE:  Intubated, Awake, alert, follows commands.  VITAL SIGNS: Temp:  [97.8 F (36.6 C)-99.4 F (37.4 C)] 99 F (37.2 C) (10/12 0336) Pulse Rate:  [33-116] 61 (10/12 0700) Resp:  [11-36] 13 (10/12 0700) BP: (84-194)/(35-107) 109/50 mmHg (10/12 0700) SpO2:  [52 %-100 %] 100 % (10/12 0700) FiO2 (%):  [30 %-40 %] 30 % (10/12 0405) Weight:  [133 lb 6.1 oz (60.5 kg)] 133 lb 6.1 oz (60.5 kg) (10/12 0500) HEMODYNAMICS:   VENTILATOR SETTINGS: Vent Mode:  [-] PRVC FiO2 (%):  [30 %-40 %] 30 % Set Rate:  [12 bmp] 12 bmp Vt Set:  [400 mL] 400 mL PEEP:  [5 cmH20] 5 cmH20 Pressure Support:  [5 cmH20] 5 cmH20 Plateau Pressure:  [17 cmH20-19 cmH20] 18 cmH20 INTAKE / OUTPUT:  Intake/Output Summary (Last 24 hours) at 03/24/15 0715 Last data filed at 03/24/15 0700  Gross per 24 hour  Intake 1612.91 ml  Output   2725 ml  Net -1112.09 ml    PHYSICAL EXAMINATION: General: Elderly female on vent, strength improving Neuro: Alert, follows commands, moves all ext equally HEENT: no swelling Cardiovascular: s1 s2 RRR Lungs: CTA, reduced rt base Abdomen: Soft, non-tender, non-distended Musculoskeletal: edema feet Skin: Grossly intact  LABS:  CBC  Recent Labs Lab 03/22/15 0500 03/22/15 1254 03/23/15 0335 03/24/15 0500  WBC 9.7  --  12.2* 11.6*  HGB 8.0* 7.1* 7.1* 8.1*  HCT 24.8* 21.0* 22.0* 24.5*  PLT 163  --  165 185   Coag's No results for input(s): APTT, INR in the last 168 hours. BMET  Recent Labs Lab 03/22/15 0500 03/22/15 1254 03/23/15 0335 03/24/15 0500  NA 138 139 139 140  K 3.5 3.3* 3.2* 3.4*   CL 101 99* 105 99*  CO2 31  --  28 32  BUN 18 20 22* 23*  CREATININE 0.86 0.90 0.79 0.85  GLUCOSE 123* 143* 146* 139*   Electrolytes  Recent Labs Lab 03/20/15 0340 03/20/15 1707 03/20/15 2134 03/21/15 0502 03/22/15 0500 03/23/15 0335 03/24/15 0500  CALCIUM 8.0*  --  12.5* 9.0 8.4* 8.3* 8.2*  MG 1.6* 2.8* 2.6* 2.4  --   --   --   PHOS 2.9 2.8  --  3.1  --   --   --    Sepsis Markers No results for input(s): LATICACIDVEN, PROCALCITON, O2SATVEN in the last 168 hours. ABG  Recent Labs Lab 03/18/15 0425  PHART 7.398  PCO2ART 33.9*  PO2ART 150*   Liver Enzymes No results for input(s): AST, ALT, ALKPHOS, BILITOT, ALBUMIN in the last 168 hours. Cardiac Enzymes  Recent Labs Lab 03/20/15 2134 03/21/15 0501 03/21/15 0905  TROPONINI <0.03 <0.03 <0.03   Glucose  Recent Labs Lab 03/21/15 2015 03/21/15 2353 03/22/15 0754 03/22/15 1952 03/23/15 0002 03/23/15 0330  GLUCAP 118* 111* 102* 157* 131* 131*    Imaging Dg Chest Port 1 View  03/24/2015  CLINICAL DATA:  Acute respiratory failure.  Shortness of breath. EXAM: PORTABLE CHEST 1 VIEW COMPARISON:  03/23/2015 FINDINGS: Endotracheal tube 2.2 cm above the carina. Feeding tube enters the stomach. Right IJ line tip: Lower SVC. Continued obscuration of both hemidiaphragms by airspace opacity and possible layering pleural effusions. Atherosclerotic calcification of the aortic arch. Hazy density in the mid lungs may be from layering effusions although edema is not readily excluded given the indistinctness of the pulmonary vasculature. Mildly enlarged cardiopericardial silhouette. IMPRESSION: 1. Stable appearance of the chest with suspected layering bilateral pleural effusions, passive atelectasis, and likely some degree of residual underlying congestive heart failure. Tubes and lines remain satisfactorily positioned. Electronically Signed   By: Gaylyn Rong M.D.   On: 03/24/2015 07:05     ASSESSMENT /  PLAN:  PULMONARY OETT 9/29 > 10/1, 10/3 >>> A: Acute respiratory failure in setting myasthenia gravis flare Mucus plugging - S/P bronch & BAL 9/29 pcxr 10/7 worsening atx rt base, improved 10/8  P:  Plan for 4th PLEX session today NIF / VC monitoring Continue to monitor secretions  CARDIOVASCULAR CVL LIJ 10/3 > A:  H/O HTN H/O HLD Drop in BP with one of plex treatment PEA arrest 10/8 secondary to precedex, r/o AS, occult valvular dz P:  Tele Lasix  IV x1 today  RENAL A:  Acute Renal Failure - resolved Hypokalemia hypomag - resolved P:  Even to pos balance Replete K Follow BMP  GASTROINTESTINAL A:  Large hiatal hernia Dysphagia Difficult NGT placement P:  Protonix IV daily for prophylaxis Tube feeds  HEMATOLOGIC A:  DVT prevention Leukocytosis post arrest - likely demargination Normocytic Anemia - Likely related to PLEX P:  Heparin for VTE prophylaxis SCDs Monitor CBC  INFECTIOUS A:  Leukocytosis in setting oral steroids Aspiration PNA - H parahaemolytics on BAL Concern for unasyn allergy P:  BCx2 9/29 >>> No growth BAL 9/29 - H parahemolyticus UC - neg Respiratory Culture 10/10 >>>  Abx:  Unasyn, start date 10/04>>>10/7 Zosyn, start date 9/29 >>>10/04 Vancomycin, start date 9/29 >>>10/04 Meropenem 10/7>>>10/12  CXR in AM  ENDOCRINE A:  Hyperglycemia  P:  Accuchecks q4hr Glu wnl  NEUROLOGIC A:  Myasthenia gravis crisis Agitation P:  RASS goal: 0 Fentanyl infusion Plex 4/5 today Nif/ vc monitoring WUA Restart home Xanax Start Haldol  FAMILY  - Updates: daughter updated by DF 10/7  - Inter-disciplinary family meet or Palliative Care meeting due by: 10/6, DONE by DF 10/9 =  I have had extensive discussions with family daughter. We discussed patients current circumstances and organ failures. We also discussed patient's prior wishes under circumstances such as this. Family has decided to NOT  perform resuscitation if arrest but to continue current medical support for now. Also dni with planned extubation.   Katina Degree. Jimmey Ralph, MD [facility] Resident PGY-2 03/24/2015 7:15 AM

## 2015-03-24 NOTE — Progress Notes (Signed)
NIF & VC results from this AM at 07:43. NIF: -30, VC: 550.

## 2015-03-24 NOTE — Progress Notes (Signed)
NEURO HOSPITALIST PROGRESS NOTE   SUBJECTIVE:                                                                                                                        She remains intubated on the vent but awake and following commands. Most recent NIF -20 and VC 400  OBJECTIVE:                                                                                                                           Vital signs in last 24 hours: Temp:  [97.8 F (36.6 C)-99.4 F (37.4 C)] 99 F (37.2 C) (10/12 0336) Pulse Rate:  [33-116] 61 (10/12 0700) Resp:  [11-36] 13 (10/12 0700) BP: (84-194)/(35-107) 109/50 mmHg (10/12 0700) SpO2:  [52 %-100 %] 100 % (10/12 0700) FiO2 (%):  [30 %-40 %] 30 % (10/12 0405) Weight:  [60.5 kg (133 lb 6.1 oz)] 60.5 kg (133 lb 6.1 oz) (10/12 0500)  Intake/Output from previous day: 10/11 0701 - 10/12 0700 In: 1612.9 [I.V.:222.9; Blood:280; NG/GT:1010; IV Piggyback:100] Out: 2725 [Urine:2725] Intake/Output this shift:   Nutritional status:    Past Medical History  Diagnosis Date  . Hypertension   . Hypercholesteremia   . Stroke [facility])   Physical exam:  Constitutional: elderly female, intubated on the vent, in no apparent distress. Eyes: no jaundice or exophthalmos.  Head: normocephalic. Neck: supple, no bruits, no JVD. Cardiac: no murmurs. Lungs: clear. Abdomen: soft, no tender, no mass. Extremities: no edema, clubbing, or cyanosis.  Skin: no rash  Neurologic Exam:  General: NAD Mental Status: Intubated on the vent but alert and awake and following some commands.  Cranial Nerves: II:  Visual fields grossly normal, pupils equal, round, reactive to light  III,IV, VI: mild left ptosis, extra-ocular motions intact bilaterally V,VII: face symmetric Motor: Moves all limbs symmetrically  Lab Results: Lab Results  Component Value Date/Time   CHOL 131 03/10/2015 08:35 PM   CHOL 154 12/17/2013 03:26 PM   Lipid  Panel No results for input(s): CHOL, TRIG, HDL, CHOLHDL, VLDL, LDLCALC in the last 72 hours.  Studies/Results: Dg Chest Port 1 View  03/24/2015  CLINICAL DATA:  Acute respiratory failure.  Shortness of breath. EXAM: PORTABLE CHEST 1 VIEW  COMPARISON:  03/23/2015 FINDINGS: Endotracheal tube 2.2 cm above the carina. Feeding tube enters the stomach. Right IJ line tip: Lower SVC. Continued obscuration of both hemidiaphragms by airspace opacity and possible layering pleural effusions. Atherosclerotic calcification of the aortic arch. Hazy density in the mid lungs may be from layering effusions although edema is not readily excluded given the indistinctness of the pulmonary vasculature. Mildly enlarged cardiopericardial silhouette. IMPRESSION: 1. Stable appearance of the chest with suspected layering bilateral pleural effusions, passive atelectasis, and likely some degree of residual underlying congestive heart failure. Tubes and lines remain satisfactorily positioned. Electronically Signed   By: Gaylyn Rong M.D.   On: 03/24/2015 07:05   Dg Chest Port 1 View  03/23/2015  CLINICAL DATA:  Intubation. EXAM: PORTABLE CHEST 1 VIEW COMPARISON:  03/22/2015. FINDINGS: Endotracheal tube tip new 2.2 cm above the carina. Right IJ line, feeding tube in stable position. Cardiomegaly with persistent diffuse alveolar infiltrates and pleural effusions consistent with persistent congestive heart failure. No interim change. Evidence of a hiatal hernia. No pneumothorax. IMPRESSION: 1. Endotracheal tube tip 2.2 cm above the carina . Remaining lines and tubes in stable position. 2. Persistent cardiomegaly with bilateral pulmonary alveolar infiltrates and pleural effusions consistent with congestive heart failure. No interim change. 3. Prominent hiatal hernia. Electronically Signed   By: Maisie Fus  Register   On: 03/23/2015 07:27    MEDICATIONS                                                                                                                         Scheduled: . ALPRAZolam  0.25 mg Oral BID  . antiseptic oral rinse  7 mL Mouth Rinse QID  . calcium gluconate  2 g Intravenous Once  . chlorhexidine gluconate  15 mL Mouth Rinse BID  . fentaNYL (SUBLIMAZE) injection  50 mcg Intravenous Once  . heparin  1,000 Units Intracatheter Once  . heparin  5,000 Units Subcutaneous 3 times per day  . hydroxypropyl methylcellulose / hypromellose  1 drop Both Eyes TID  . latanoprost  1 drop Right Eye QHS  . meropenem (MERREM) IV  1 g Intravenous Q12H  . pantoprazole sodium  40 mg Per Tube Daily  . potassium chloride  40 mEq Oral Once    ASSESSMENT/PLAN:                                                                                                            81 y/o transferred to [facility] for further management of likely MG crisis with respiratory failure.  Patient is intubated on the vent but alert, awake and able to follow commands. Hospital course complicated by brief arrest.   -pending 4/5 PLEX today -weaning trial/extubation as per critical care attending. -would consider re-introducing mestinon when extubated -will continue to follow  Elspeth ChoPeter Rembert Browe, DO [facility] [phone]  If 7pm- 7am, please page neurology on call as listed in AMION.   03/24/2015, 7:36 AM

## 2015-03-24 NOTE — Evaluation (Addendum)
Physical Therapy Evaluation Patient Details Name: Cheyenne Boone MRN: [id number] DOB: 01/02/1924 Today's Date: 03/24/2015   History of Present Illness  Pt is an 81 year old female with 10 day history of dysphagia. [facility] for what was suspected to be myasthenia gravis flare. She was started on on IVIG and mestinon, however eventually required intubation. Transferred to [facility] at family request and for possible PLEX. On 10/5 PLEX planned, EGD for OGT placement. PLEX stopped due to hypotension. PMH includes severe cervical disc degeneration, HTN, and prior stroke. Additional acute events on 02/18/15 pt went into cardiac arrest (PEA with chest compressions).  Clinical Impression   pt progressing well.  Emphasis on sitting and standing balance, pre-gait activity and transfer to the chair.  Pt following commands well and able to stay focused on the task.     Follow Up Recommendations SNF;Supervision/Assistance - 24 hour    Equipment Recommendations  None recommended by PT    Recommendations for Other Services       Precautions / Restrictions Precautions Precautions: Fall Precaution Comments: on vent      Mobility  Bed Mobility   Bed Mobility: Supine to Sit     Supine to sit: Min assist;+2 for safety/equipment        Transfers Overall transfer level: Needs assistance   Transfers: Sit to/from Stand Sit to Stand: +2 safety/equipment;Min assist;Mod assist Stand pivot transfers: Mod assist;+2 safety/equipment       General transfer comment: stability assist only  Ambulation/Gait             General Gait Details: not addressed  Stairs            Wheelchair Mobility    Modified Rankin (Stroke Patients Only)       Balance Overall balance assessment: Needs assistance Sitting-balance support: No upper extremity supported;Single extremity supported Sitting balance-Leahy Scale: Fair Sitting balance - Comments: sat EOB unassisted while lines were  further untangled   Standing balance support: No upper extremity supported;Single extremity supported Standing balance-Leahy Scale: Poor Standing balance comment: worked on balance, w/shift and marching in place for approx 2 min before transfer to the chair.                             Pertinent Vitals/Pain Pain Assessment: Faces Faces Pain Scale: No hurt    Home Living                        Prior Function                 Hand Dominance        Extremity/Trunk Assessment                         Communication      Cognition Arousal/Alertness: Awake/alert Behavior During Therapy: WFL for tasks assessed/performed Overall Cognitive Status: Within Functional Limits for tasks assessed Area of Impairment: Following commands       Following Commands: Follows one step commands consistently            General Comments General comments (skin integrity, edema, etc.): VSS    Exercises        Assessment/Plan    PT Assessment    PT Diagnosis     PT Problem List    PT Treatment Interventions     PT Goals (Current goals can be found  in the Care Plan section) Acute Rehab PT Goals Patient Stated Goal: None stated PT Goal Formulation: Patient unable to participate in goal setting Time For Goal Achievement: 04/01/15 Potential to Achieve Goals: Fair    Frequency Min 2X/week   Barriers to discharge        Co-evaluation               End of Session Equipment Utilized During Treatment: Oxygen Activity Tolerance: Patient limited by fatigue Patient left: in chair;with chair alarm set;Other (comment) (mitts applied) Nurse Communication: Mobility status         Time: 9604-5409 PT Time Calculation (min) (ACUTE ONLY): 29 min   Charges:     PT Treatments $Therapeutic Activity: 23-37 mins   PT G Codes:        Trevor Wilkie, Eliseo Gum 03/24/2015, 11:20 AM  03/24/2015  Livermore Bing, PT [phone]   (pager)

## 2015-03-25 ENCOUNTER — Inpatient Hospital Stay (HOSPITAL_COMMUNITY): Payer: Commercial Managed Care - HMO

## 2015-03-25 LAB — CBC
HEMATOCRIT: 22.9 % — AB (ref 36.0–46.0)
Hemoglobin: 7.4 g/dL — ABNORMAL LOW (ref 12.0–15.0)
MCH: 30.6 pg (ref 26.0–34.0)
MCHC: 32.3 g/dL (ref 30.0–36.0)
MCV: 94.6 fL (ref 78.0–100.0)
Platelets: 194 10*3/uL (ref 150–400)
RBC: 2.42 MIL/uL — ABNORMAL LOW (ref 3.87–5.11)
RDW: 16.6 % — AB (ref 11.5–15.5)
WBC: 10.7 10*3/uL — AB (ref 4.0–10.5)

## 2015-03-25 LAB — CULTURE, RESPIRATORY: SPECIAL REQUESTS: NORMAL

## 2015-03-25 LAB — GLUCOSE, CAPILLARY
GLUCOSE-CAPILLARY: 101 mg/dL — AB (ref 65–99)
GLUCOSE-CAPILLARY: 103 mg/dL — AB (ref 65–99)
GLUCOSE-CAPILLARY: 114 mg/dL — AB (ref 65–99)
GLUCOSE-CAPILLARY: 117 mg/dL — AB (ref 65–99)
Glucose-Capillary: 93 mg/dL (ref 65–99)
Glucose-Capillary: 102 mg/dL — ABNORMAL HIGH (ref 65–99)

## 2015-03-25 LAB — BLOOD GAS, ARTERIAL
Acid-Base Excess: 2 mmol/L (ref 0.0–2.0)
Acid-Base Excess: 4 mmol/L — ABNORMAL HIGH (ref 0.0–2.0)
Bicarbonate: 25.6 mEq/L — ABNORMAL HIGH (ref 20.0–24.0)
Bicarbonate: 27.6 mEq/L — ABNORMAL HIGH (ref 20.0–24.0)
DRAWN BY: 365271
DRAWN BY: 44898
FIO2: 0.3
FIO2: 0.3
LHR: 12 {breaths}/min
MECHVT: 400 mL
O2 SAT: 97.3 %
O2 Saturation: 97.6 %
PATIENT TEMPERATURE: 97.9
PATIENT TEMPERATURE: 97.3
PCO2 ART: 36.7 mmHg (ref 35.0–45.0)
PCO2 ART: 37.5 mmHg (ref 35.0–45.0)
PEEP/CPAP: 5 cmH2O
PEEP: 5 cmH2O
PO2 ART: 93.7 mmHg (ref 80.0–100.0)
PO2 ART: 85 mmHg (ref 80.0–100.0)
Pressure support: 5 cmH2O
TCO2: 26.8 mmol/L (ref 0–100)
TCO2: 28.8 mmol/L (ref 0–100)
pH, Arterial: 7.456 — ABNORMAL HIGH (ref 7.350–7.450)
pH, Arterial: 7.477 — ABNORMAL HIGH (ref 7.350–7.450)

## 2015-03-25 LAB — BASIC METABOLIC PANEL
ANION GAP: 5 (ref 5–15)
BUN: 22 mg/dL — AB (ref 6–20)
CALCIUM: 8 mg/dL — AB (ref 8.9–10.3)
CO2: 29 mmol/L (ref 22–32)
Chloride: 102 mmol/L (ref 101–111)
Creatinine, Ser: 0.73 mg/dL (ref 0.44–1.00)
GFR calc Af Amer: >60 mL/min (ref >60)
GLUCOSE: 125 mg/dL — AB (ref 65–99)
POTASSIUM: 3.3 mmol/L — AB (ref 3.5–5.1)
SODIUM: 136 mmol/L (ref 135–145)

## 2015-03-25 LAB — POCT I-STAT, CHEM 8
BUN: 27 mg/dL — ABNORMAL HIGH (ref 6–20)
Calcium, Ion: 1.19 mmol/L (ref 1.13–1.30)
Chloride: 96 mmol/L — ABNORMAL LOW (ref 101–111)
Creatinine, Ser: 0.8 mg/dL (ref 0.44–1.00)
Glucose, Bld: 116 mg/dL — ABNORMAL HIGH (ref 65–99)
HEMATOCRIT: 24 % — AB (ref 36.0–46.0)
HEMOGLOBIN: 8.2 g/dL — AB (ref 12.0–15.0)
POTASSIUM: 3.4 mmol/L — AB (ref 3.5–5.1)
SODIUM: 139 mmol/L (ref 135–145)
TCO2: 30 mmol/L (ref 0–100)

## 2015-03-25 LAB — ACETYLCHOLINE RECEPTOR AB, ALL
ACETYLCHOL BLOCK AB: 59 % — AB (ref 0–25)
Acetylcholine Modulat Ab: 49 % — ABNORMAL HIGH (ref 0–20)

## 2015-03-25 LAB — CULTURE, RESPIRATORY W GRAM STAIN

## 2015-03-25 MED ORDER — SODIUM CHLORIDE 0.9 % IV SOLN
INTRAVENOUS | Status: DC
  Administered 2015-03-27 – 2015-03-30 (×3): via INTRAVENOUS

## 2015-03-25 MED ORDER — ACD FORMULA A 0.73-2.45-2.2 GM/100ML VI SOLN
500.0000 mL | Status: DC
Start: 2015-03-25 — End: 2015-03-26
  Administered 2015-03-25: 500 mL via INTRAVENOUS
  Filled 2015-03-25: qty 500

## 2015-03-25 MED ORDER — FUROSEMIDE 10 MG/ML IJ SOLN
40.0000 mg | Freq: Once | INTRAMUSCULAR | Status: AC
  Administered 2015-03-25: 40 mg via INTRAVENOUS
  Filled 2015-03-25: qty 4

## 2015-03-25 MED ORDER — POTASSIUM CHLORIDE 10 MEQ/50ML IV SOLN
10.0000 meq | INTRAVENOUS | Status: AC
  Administered 2015-03-25 (×3): 10 meq via INTRAVENOUS
  Filled 2015-03-25 (×3): qty 50

## 2015-03-25 MED ORDER — SODIUM CHLORIDE 0.9 % IV SOLN
4.0000 g | Freq: Once | INTRAVENOUS | Status: AC
  Administered 2015-03-25: 4 g via INTRAVENOUS
  Filled 2015-03-25: qty 40

## 2015-03-25 MED ORDER — CALCIUM CARBONATE ANTACID 500 MG PO CHEW
2.0000 | CHEWABLE_TABLET | ORAL | Status: AC
  Filled 2015-03-25 (×2): qty 2

## 2015-03-25 NOTE — Progress Notes (Signed)
PULMONARY / CRITICAL CARE MEDICINE   Name: EVIANA SIBILIA MRN: [id number] DOB: 07/14/1923    ADMISSION DATE:  03/15/2015 CONSULTATION DATE:  03/15/2015  REFERRING MD :  Jamison Neighbor  CHIEF COMPLAINT:  Dysphagia  INITIAL PRESENTATION: 81 year old female with 10 day history of dysphagia. Was admitted to [facility] for what was suspected to be myasthenia gravis flare. She was started on on IVIG and mestinon, however eventually required intubation. Transferred to [facility] at family request and for possible PLEX. PCCM to admit.   STUDIES:  9/27 CT head - Stable noncontrast CT appearance of the brain since May. No acute intracranial abnormality  9/27 Barium swallow - Nondiagnostic barium swallow examination due to the patient's inability to more than tiny amounts of the barium, Possible mass in the AP window. Possible infrahilar pneumonia on the left.  9/27 CT chest - Significant coronary artery disease, mitral annulus and aortic valve disease. Tortuous aorta without aneurysm. Large hiatal hernia. No associated obstruction.  9/27 CT soft tissue neck - Moderate calcified plaque is noted in proximal right internal carotid artery; carotid ultrasound is recommended for further evaluation. Fluid is noted in the right mastoid air cells. Clinical correlation is recommended to rule out mastoiditis.  9/28 MRI brain - No acute intracranial abnormality. Chronic small vessel ischemic disease is stable since 2015. Chronic cervical spine degeneration with C3-C4 and C4-C5 spinal stenosis which does not appear significantly progressed since 2015.  9/28 Carotid doppler - Right carotid bifurcation and proximal ICA plaque, resulting in less than 50% diameter stenosis. The exam does not exclude plaque ulceration or embolization.  10/12 Echo - EF 60-65%, no AS, grade 1 diastolic dysfunction  SIGNIFICANT EVENTS: 9/27 - Admit for dysphagia  9/29 - Transfer to ICU @ [facility] for respiratory failure & intubated>>Bronchoscopy with  bilateral secretions & plugging 10/01 - Extubated 10/03 - Reintubated 10/03 - Transfer to [facility] from [facility] 10/4- HD cath placed 10/5 PLEX planned, EGD for OGT placement. PLEX stopped due to hypotension 10/7- concern for allergic rxn to UNasyn or plex citrate, improved with treatment 10/8- delirium severe--> precedex-->PEA short arrest--> woke up  SUBJECTIVE:  Intubated, Awake, alert, follows commands.  VITAL SIGNS: Temp:  [97.9 F (36.6 C)-98.9 F (37.2 C)] 98 F (36.7 C) (10/13 0403) Pulse Rate:  [55-105] 70 (10/13 0600) Resp:  [12-35] 13 (10/13 0600) BP: (84-151)/(39-109) 105/47 mmHg (10/13 0600) SpO2:  [98 %-100 %] 100 % (10/13 0600) FiO2 (%):  [30 %] 30 % (10/13 0312) Weight:  [134 lb 7.7 oz (61 kg)] 134 lb 7.7 oz (61 kg) (10/13 0500) HEMODYNAMICS:   VENTILATOR SETTINGS: Vent Mode:  [-] PRVC FiO2 (%):  [30 %] 30 % Set Rate:  [12 bmp] 12 bmp Vt Set:  [400 mL] 400 mL PEEP:  [5 cmH20] 5 cmH20 Pressure Support:  [5 cmH20] 5 cmH20 Plateau Pressure:  [16 cmH20-19 cmH20] 17 cmH20 INTAKE / OUTPUT:  Intake/Output Summary (Last 24 hours) at 03/25/15 0723 Last data filed at 03/25/15 0600  Gross per 24 hour  Intake   1410 ml  Output   2250 ml  Net   -840 ml    PHYSICAL EXAMINATION: General: Elderly female on vent, strength improving Neuro: Alert, follows commands, moves all ext equally HEENT: no swelling Cardiovascular: s1 s2 RRR Lungs: CTA, reduced rt base Abdomen: Soft, non-tender, non-distended Musculoskeletal: edema feet Skin: Grossly intact  LABS:  CBC  Recent Labs Lab 03/23/15 0335 03/24/15 0500 03/25/15 0237 03/25/15 0515  WBC 12.2* 11.6*  --  10.7*  HGB 7.1* 8.1* 8.2* 7.4*  HCT 22.0* 24.5* 24.0* 22.9*  PLT 165 185  --  194   Coag's No results for input(s): APTT, INR in the last 168 hours. BMET  Recent Labs Lab 03/23/15 0335 03/24/15 0500 03/25/15 0237 03/25/15 0515  NA 139 140 139 136  K 3.2* 3.4* 3.4* 3.3*  CL 105 99* 96*  102  CO2 28 32  --  29  BUN 22* 23* 27* 22*  CREATININE 0.79 0.85 0.80 0.73  GLUCOSE 146* 139* 116* 125*   Electrolytes  Recent Labs Lab 03/20/15 0340 03/20/15 1707 03/20/15 2134 03/21/15 0502  03/23/15 0335 03/24/15 0500 03/25/15 0515  CALCIUM 8.0*  --  12.5* 9.0  < > 8.3* 8.2* 8.0*  MG 1.6* 2.8* 2.6* 2.4  --   --  1.8  --   PHOS 2.9 2.8  --  3.1  --   --   --   --   < > = values in this interval not displayed. Sepsis Markers No results for input(s): LATICACIDVEN, PROCALCITON, O2SATVEN in the last 168 hours. ABG  Recent Labs Lab 03/25/15 0416  PHART 7.456*  PCO2ART 36.7  PO2ART 93.7   Liver Enzymes No results for input(s): AST, ALT, ALKPHOS, BILITOT, ALBUMIN in the last 168 hours. Cardiac Enzymes  Recent Labs Lab 03/20/15 2134 03/21/15 0501 03/21/15 0905  TROPONINI <0.03 <0.03 <0.03   Glucose  Recent Labs Lab 03/23/15 0330 03/24/15 0802 03/24/15 1200 03/24/15 1943 03/25/15 0006 03/25/15 0345  GLUCAP 131* 127* 110* 119* 114* 93    Imaging No results found.   ASSESSMENT / PLAN:  PULMONARY OETT 9/29 > 10/1, 10/3 >>> A: Acute respiratory failure in setting myasthenia gravis flare Mucus plugging - S/P bronch & BAL 9/29 pcxr 10/7 worsening atx rt base, improved 10/8  P:  Extubate today, per discussion with family it was decided that the patient would not be re-intubated. Plan for 5th PLEX session tomorrow NIF / VC monitoring Continue to monitor secretions  CARDIOVASCULAR CVL LIJ 10/3 > A:  H/O HTN H/O HLD Drop in BP with one of plex treatment PEA arrest 10/8 secondary to precedex, r/o AS, occult valvular dz P:  Tele Lasix 40mg  IV x1 today  RENAL A:  Acute Renal Failure - resolved Hypokalemia hypomag - resolved P:  Replete K Follow BMP  GASTROINTESTINAL A:  Large hiatal hernia Dysphagia Difficult NGT placement P:  Protonix IV daily for prophylaxis SLP eval pending  HEMATOLOGIC A:  DVT  prevention Leukocytosis post arrest - likely demargination Normocytic Anemia - Likely related to PLEX P:  Heparin for VTE prophylaxis SCDs Monitor CBC  INFECTIOUS A:  Aspiration PNA - H parahaemolytics on BAL Concern for unasyn allergy P:  BCx2 9/29 >>> No growth BAL 9/29 - H parahemolyticus UC - neg Respiratory Culture 10/10 >>>  Abx:  Unasyn, start date 10/04>>>10/7 Zosyn, start date 9/29 >>>10/04 Vancomycin, start date 9/29 >>>10/04 Meropenem 10/7>>>10/12  CXR in AM  ENDOCRINE A:  Hyperglycemia  P:  Accuchecks q4hr Glu wnl  NEUROLOGIC A:  Myasthenia gravis crisis Agitation P:  Plex 5/5 tomorrow Consider restarting Mestinon once extubated Will need SLP eval Continue home Xanax Haldol prn agitation  FAMILY  - Updates: daughter updated by DF 10/7  - Inter-disciplinary family meet or Palliative Care meeting due by: 10/6, DONE by DF 10/9 =  I have had extensive discussions with family daughter. We discussed patients current circumstances and organ failures. We also discussed patient's prior wishes  under circumstances such as this. Family has decided to NOT perform resuscitation if arrest but to continue current medical support for now. Also dni with planned extubation.  Summary: Strength continuing to improve s/p 4 PLEX sessions. Will extubate today. Per discussion with family, this will be a one-way extubation. Plan for final PLEX session tomorrow.   Katina Degree. Jimmey Ralph, MD [facility] Family Medicine Resident PGY-2 03/25/2015 7:23 AM

## 2015-03-25 NOTE — Care Management Important Message (Signed)
Important Message  Patient Details  Name: Cheyenne Boone MRN: [id number] Date of Birth: 02/13/1924   Medicare Important Message Given:  Yes-second notification given    Kyla BalzarineShealy, Davanee Klinkner Abena 03/25/2015, 10:35 AM

## 2015-03-25 NOTE — Progress Notes (Signed)
eLink Physician-Brief Progress Note Patient Name: Cheyenne GoldKatherine M Boone DOB: 12/25/1923 MRN: [id number]   Date of Service  03/25/2015  HPI/Events of Note  hypokalemia  eICU Interventions  Ordered replacement.      Intervention Category Major Interventions: Electrolyte abnormality - evaluation and management  [name] 03/25/2015, 6:02 AM

## 2015-03-25 NOTE — Progress Notes (Signed)
NIF -16/ VC 451, with good effort.

## 2015-03-25 NOTE — Progress Notes (Addendum)
NEURO HOSPITALIST PROGRESS NOTE   SUBJECTIVE:                                                                                                                        She remains intubated on the vent but awake and following commands. Most recent NIF -30 and VC 550. Completed 4/5 PLEX last night.  AchR antibody 7.93   OBJECTIVE:                                                                                                                           Vital signs in last 24 hours: Temp:  [97.9 F (36.6 C)-98.9 F (37.2 C)] 98 F (36.7 C) (10/13 0403) Pulse Rate:  [55-105] 70 (10/13 0600) Resp:  [12-35] 13 (10/13 0600) BP: (84-151)/(39-109) 105/47 mmHg (10/13 0600) SpO2:  [98 %-100 %] 100 % (10/13 0600) FiO2 (%):  [30 %] 30 % (10/13 0312) Weight:  [61 kg (134 lb 7.7 oz)] 61 kg (134 lb 7.7 oz) (10/13 0500)  Intake/Output from previous day: 10/12 0701 - 10/13 0700 In: 1410 [I.V.:210; NG/GT:1100; IV Piggyback:100] Out: 2250 [Urine:2250] Intake/Output this shift:   Nutritional status:    Past Medical History  Diagnosis Date  . Hypertension   . Hypercholesteremia   . Stroke [facility](HCC)    Neurologic Exam:  General: NAD Mental Status: Intubated on the vent but alert and awake and following some commands.  Cranial Nerves: II:  Visual fields grossly normal, pupils equal, round, reactive to light  III,IV, VI: mild left ptosis, extra-ocular motions intact bilaterally V,VII: face symmetric Motor: Moves all limbs symmetrically  Lab Results: Lab Results  Component Value Date/Time   CHOL 131 03/10/2015 08:35 PM   CHOL 154 12/17/2013 03:26 PM   Lipid Panel No results for input(s): CHOL, TRIG, HDL, CHOLHDL, VLDL, LDLCALC in the last 72 hours.  Studies/Results: Dg Chest Port 1 View  03/24/2015  CLINICAL DATA:  Acute respiratory failure.  Shortness of breath. EXAM: PORTABLE CHEST 1 VIEW COMPARISON:  03/23/2015 FINDINGS: Endotracheal tube 2.2 cm  above the carina. Feeding tube enters the stomach. Right IJ line tip: Lower SVC. Continued obscuration of both hemidiaphragms by airspace opacity and possible layering pleural effusions. Atherosclerotic calcification of the aortic arch.  Hazy density in the mid lungs may be from layering effusions although edema is not readily excluded given the indistinctness of the pulmonary vasculature. Mildly enlarged cardiopericardial silhouette. IMPRESSION: 1. Stable appearance of the chest with suspected layering bilateral pleural effusions, passive atelectasis, and likely some degree of residual underlying congestive heart failure. Tubes and lines remain satisfactorily positioned. Electronically Signed   By: Gaylyn Rong M.D.   On: 03/24/2015 07:05    MEDICATIONS                                                                                                                        Scheduled: . ALPRAZolam  0.25 mg Oral BID  . antiseptic oral rinse  7 mL Mouth Rinse QID  . calcium carbonate  2 tablet Oral Q3H  . chlorhexidine gluconate  15 mL Mouth Rinse BID  . fentaNYL (SUBLIMAZE) injection  50 mcg Intravenous Once  . heparin  5,000 Units Subcutaneous 3 times per day  . hydroxypropyl methylcellulose / hypromellose  1 drop Both Eyes TID  . latanoprost  1 drop Right Eye QHS  . pantoprazole sodium  40 mg Per Tube Daily  . potassium chloride  10 mEq Intravenous Q1 Hr x 3    ASSESSMENT/PLAN:                                                                                                            81 y/o transferred to [facility] for further management of likely MG crisis with respiratory failure. Patient is intubated on the vent but alert, awake and able to follow commands. Hospital course complicated by brief arrest.   -completed 4/5 PLEX, 5th round to be completed Friday -weaning trial/extubation as per critical care attending. -would consider re-introducing mestinon when extubated -will continue to  follow  Elspeth Cho, DO [facility] [phone]  If 7pm- 7am, please page neurology on call as listed in AMION.   03/25/2015, 7:22 AM

## 2015-03-25 NOTE — Procedures (Signed)
Extubation Procedure Note  Patient Details:   Name: Cheyenne Boone DOB: 09/01/1923 MRN: [id number]   Airway Documentation:  Airway (Active)    Evaluation  O2 sats: stable throughout Complications: No apparent complications Patient did tolerate procedure well. Bilateral Breath Sounds: Clear, Diminished Suctioning: Oral, Airway Yes   Pt. Was extubated to a 3L [place] with RN at the bedside without any complications, dyspnea or stridor noted. Pt. Is doing well at this time & all vitals are within normal limits.  Remi Lopata, Cheyenne Boone 03/25/2015, 10:58 AM

## 2015-03-26 ENCOUNTER — Inpatient Hospital Stay (HOSPITAL_COMMUNITY): Payer: Commercial Managed Care - HMO

## 2015-03-26 LAB — CBC
HEMATOCRIT: 27.4 % — AB (ref 36.0–46.0)
HEMOGLOBIN: 8.9 g/dL — AB (ref 12.0–15.0)
MCH: 31 pg (ref 26.0–34.0)
MCHC: 32.5 g/dL (ref 30.0–36.0)
MCV: 95.5 fL (ref 78.0–100.0)
Platelets: 278 10*3/uL (ref 150–400)
RBC: 2.87 MIL/uL — AB (ref 3.87–5.11)
RDW: 16.6 % — ABNORMAL HIGH (ref 11.5–15.5)
WBC: 13.6 10*3/uL — ABNORMAL HIGH (ref 4.0–10.5)

## 2015-03-26 LAB — BASIC METABOLIC PANEL
ANION GAP: 9 (ref 5–15)
BUN: 21 mg/dL — ABNORMAL HIGH (ref 6–20)
CALCIUM: 8.6 mg/dL — AB (ref 8.9–10.3)
CHLORIDE: 97 mmol/L — AB (ref 101–111)
CO2: 28 mmol/L (ref 22–32)
Creatinine, Ser: 0.76 mg/dL (ref 0.44–1.00)
GFR calc non Af Amer: >60 mL/min (ref >60)
Glucose, Bld: 113 mg/dL — ABNORMAL HIGH (ref 65–99)
POTASSIUM: 3.8 mmol/L (ref 3.5–5.1)
Sodium: 134 mmol/L — ABNORMAL LOW (ref 135–145)

## 2015-03-26 LAB — GLUCOSE, CAPILLARY
GLUCOSE-CAPILLARY: 78 mg/dL (ref 65–99)
Glucose-Capillary: 89 mg/dL (ref 65–99)
Glucose-Capillary: 106 mg/dL — ABNORMAL HIGH (ref 65–99)

## 2015-03-26 MED ORDER — FUROSEMIDE 10 MG/ML IJ SOLN
40.0000 mg | Freq: Once | INTRAMUSCULAR | Status: AC
  Administered 2015-03-26: 40 mg via INTRAVENOUS
  Filled 2015-03-26: qty 4

## 2015-03-26 MED ORDER — ACETAMINOPHEN 325 MG PO TABS: 650.0000 mg | ORAL_TABLET | ORAL | Status: DC | PRN

## 2015-03-26 MED ORDER — HEPARIN SODIUM (PORCINE) 1000 UNIT/ML IJ SOLN: 1000.0000 [IU] | Freq: Once | INTRAMUSCULAR | Status: DC

## 2015-03-26 MED ORDER — ONDANSETRON HCL 4 MG/2ML IJ SOLN
4.0000 mg | Freq: Four times a day (QID) | INTRAMUSCULAR | Status: DC | PRN
  Administered 2015-03-26 – 2015-03-27 (×2): 4 mg via INTRAVENOUS
  Filled 2015-03-26: qty 2

## 2015-03-26 MED ORDER — SODIUM CHLORIDE 0.9 % IV SOLN
4.0000 g | Freq: Once | INTRAVENOUS | Status: DC
  Filled 2015-03-26: qty 40

## 2015-03-26 MED ORDER — CALCIUM CARBONATE ANTACID 500 MG PO CHEW
2.0000 | CHEWABLE_TABLET | ORAL | Status: AC
  Filled 2015-03-26 (×2): qty 2

## 2015-03-26 MED ORDER — ONDANSETRON HCL 4 MG/2ML IJ SOLN
INTRAMUSCULAR | Status: AC
  Administered 2015-03-26: 22:00:00
  Filled 2015-03-26: qty 2

## 2015-03-26 MED ORDER — ACD FORMULA A 0.73-2.45-2.2 GM/100ML VI SOLN
Status: AC
  Filled 2015-03-26: qty 500

## 2015-03-26 MED ORDER — DIPHENHYDRAMINE HCL 25 MG PO CAPS: 25.0000 mg | ORAL_CAPSULE | Freq: Four times a day (QID) | ORAL | Status: DC | PRN

## 2015-03-26 MED ORDER — PYRIDOSTIGMINE BROMIDE 60 MG PO TABS
30.0000 mg | ORAL_TABLET | Freq: Two times a day (BID) | ORAL | Status: DC
  Administered 2015-03-26 – 2015-03-27 (×2): 30 mg via ORAL
  Filled 2015-03-26 (×8): qty 0.5

## 2015-03-26 MED ORDER — RESOURCE THICKENUP CLEAR PO POWD
ORAL | Status: DC | PRN
  Administered 2015-03-26: 18:00:00 via ORAL
  Filled 2015-03-26: qty 125

## 2015-03-26 MED ORDER — NYSTATIN 100000 UNIT/ML MT SUSP
5.0000 mL | Freq: Four times a day (QID) | OROMUCOSAL | Status: DC
  Administered 2015-03-26 – 2015-03-29 (×5): 500000 [IU] via ORAL
  Filled 2015-03-26 (×14): qty 5

## 2015-03-26 MED ORDER — SODIUM CHLORIDE 0.9 % IV SOLN
INTRAVENOUS | Status: AC
  Filled 2015-03-26 (×3): qty 200

## 2015-03-26 MED ORDER — ACD FORMULA A 0.73-2.45-2.2 GM/100ML VI SOLN
500.0000 mL | Status: DC
  Filled 2015-03-26: qty 500

## 2015-03-26 NOTE — Progress Notes (Signed)
eLink Physician-Brief Progress Note Patient Name: Cheyenne GoldKatherine M Kendzierski DOB: 03/23/1924 MRN: [id number]   Date of Service  03/26/2015  HPI/Events of Note  Nausea  eICU Interventions  Zofran PRN     Intervention Category Minor Interventions: Other:  Nadyne Gariepy 03/26/2015, 6:54 PM

## 2015-03-26 NOTE — Progress Notes (Signed)
Physical Therapy Treatment Patient Details Name: Cheyenne Boone MRN: [id number] DOB: 06/03/1924 Today's Date: 03/26/2015    History of Present Illness Pt is an 81 year old female with 10 day history of dysphagia. Was admitted [facility] for what was suspected to be myasthenia gravis flare. She was started on on IVIG and mestinon, however eventually required intubation. Transferred to [facility] at family request and for possible PLEX. On 10/5 PLEX planned, EGD for OGT placement. PLEX stopped due to hypotension. PMH includes severe cervical disc degeneration, HTN, and prior stroke. Additional acute events on 02/18/15 pt went into cardiac arrest (PEA with chest compressions).    PT Comments    Progressing steadily.  Emphasis on sitting and standing balance, marching in place, strengthening exercise.   Follow Up Recommendations  SNF;Supervision/Assistance - 24 hour     Equipment Recommendations  None recommended by PT    Recommendations for Other Services       Precautions / Restrictions Precautions Precautions: Fall Restrictions Weight Bearing Restrictions: No    Mobility  Bed Mobility Overal bed mobility: Needs Assistance Bed Mobility: Supine to Sit;Sit to Supine     Supine to sit: Mod assist Sit to supine: Mod assist      Transfers Overall transfer level: Needs assistance Equipment used: 1 person hand held assist Transfers: Sit to/from Stand Sit to Stand: Min assist;Mod assist         General transfer comment: stood x4, at EOB using tray table  Ambulation/Gait                 Stairs            Wheelchair Mobility    Modified Rankin (Stroke Patients Only)       Balance Overall balance assessment: Needs assistance Sitting-balance support: No upper extremity supported Sitting balance-Leahy Scale: Good Sitting balance - Comments: reaching outside BOS   Standing balance support: During functional activity;Bilateral upper extremity  supported Standing balance-Leahy Scale: Poor Standing balance comment: 4 standing trials, upright stance, w/shifting, marching in place, side stepping.                    Cognition Arousal/Alertness: Awake/alert Behavior During Therapy: WFL for tasks assessed/performed Overall Cognitive Status: Within Functional Limits for tasks assessed Area of Impairment: Following commands;Memory;Orientation;Awareness;Problem solving;Safety/judgement Orientation Level: Time;Place   Memory: Decreased short-term memory Following Commands: Follows one step commands consistently Safety/Judgement: Decreased awareness of safety;Decreased awareness of deficits Awareness: Intellectual Problem Solving: Slow processing      Exercises General Exercises - Lower Extremity Heel Slides: AROM;Strengthening;Both;10 reps;Supine (graded resistance) Other Exercises Other Exercises: bicep/tricep presses with graded resistance x 10 reps.    General Comments General comments (skin integrity, edema, etc.): mid to upper 90's% on 2L [place],  HR  90's/100's bpm      Pertinent Vitals/Pain Pain Assessment: Faces Faces Pain Scale: Hurts even more Pain Location: stomach Pain Descriptors / Indicators: Sore Pain Intervention(s): Monitored during session    Home Living                      Prior Function            PT Goals (current goals can now be found in the care plan section) Acute Rehab PT Goals Patient Stated Goal: None stated PT Goal Formulation: Patient unable to participate in goal setting Time For Goal Achievement: 04/01/15 Potential to Achieve Goals: Fair Progress towards PT goals: Progressing toward goals    Frequency  Min 2X/week    PT Plan Current plan remains appropriate    Co-evaluation             End of Session Equipment Utilized During Treatment: Oxygen Activity Tolerance: Patient tolerated treatment well;Patient limited by fatigue Patient left: in bed;with call  bell/phone within reach;with nursing/sitter in room     Time: 1645-1710 PT Time Calculation (min) (ACUTE ONLY): 25 min  Charges:  $Therapeutic Exercise: 8-22 mins $Therapeutic Activity: 8-22 mins                    G Codes:      Elizabethanne Lusher, Eliseo Gum 03/26/2015, 5:20 PM  03/26/2015  [place] Bing, PT [phone]  (pager)

## 2015-03-26 NOTE — Progress Notes (Signed)
Nutrition Follow-up  DOCUMENTATION CODES:   Severe malnutrition in context of chronic illness  INTERVENTION:   - RD to continue to monitor patient.  - If patient's diet is not advanced, recommended to initiate tube feeding Vital AF 1.2 @ 55 mL/hr (1320 mL/day). This provides 1584 kcal (100%), 99 gm protein (100%), and 1070 mL free water.   NUTRITION DIAGNOSIS:   Malnutrition related to chronic illness, dysphagia as evidenced by severe depletion of muscle mass, percent weight loss.  Ongoing.  GOAL:   Patient will meet greater than or equal to 90% of their needs  Not met, patient currently NPO.   MONITOR:   Vent status, Labs, Weight trends, TF tolerance, I & O's, Diet advancement, Skin  REASON FOR ASSESSMENT:   Consult, Ventilator Enteral/tube feeding initiation and management  ASSESSMENT:   81 year old female with 10 day history of dysphagia. Was admitted to [facility] for what was suspected to be myasthenia gravis flare. She was started on IVIG and mestinon, however eventually required intubation. Transferred to [facility] at [facility] request and for possible PLEX.   Patient visited for follow-up assessment post extubation. Patient alert, sitting up-right in chair, and able to talk. She describes that she is "weak" and per chart she is "ready to eat".  Patient is currently NPO and has a speech evaluation today for diet advancement.    Per rounds meeting, patient is being prepared for transfer to step-down unit.   Patient's needs were re-estimated and new recommendations provided. Patient is severely malnourished. Phosphorus, magnesium, and potassium are now WNL.  Diet Order:  Diet NPO time specified  Skin:  Reviewed, no issues  Last BM:  unknown  Height:   Ht Readings from Last 1 Encounters:  03/15/15 5' 2" (1.575 m)    Weight:   Wt Readings from Last 1 Encounters:  03/26/15 127 lb 6.8 oz (57.8 kg)    Ideal Body Weight:  50 kg  BMI:  Body mass index is 23.3  kg/(m^2).  Estimated Nutritional Needs:   Kcal:  1445-1734 kcal  Protein:  75-85 gm  Fluid:  1.5 L  EDUCATION NEEDS:   No education needs identified at this time  Kayleen Memos, Dietetic Intern 03/26/2015 12:08 PM   I agree with student dietitian note; appropriate revisions have been made.  Molli Barrows, RD, LDN, [place] Pager# [phone] After Hours Pager# [phone]

## 2015-03-26 NOTE — Progress Notes (Signed)
MBSS complete. Full report located under chart review in imaging section.  Cheyenne Boone, M.A. CCC-SLP [phone]  

## 2015-03-26 NOTE — Progress Notes (Signed)
eLink Physician-Brief Progress Note Patient Name: Cheyenne GoldKatherine M Heggs DOB: 05/25/1924 MRN: [id number]   Date of Service  03/26/2015  HPI/Events of Note  Oral thrush  eICU Interventions  Nystatin swish and spit     Intervention Category Minor Interventions: Other:  Kyler Germer 03/26/2015, 5:39 PM

## 2015-03-26 NOTE — Procedures (Signed)
NIF = -42 cmH20 , VC = 1.4L

## 2015-03-26 NOTE — Progress Notes (Signed)
PULMONARY / CRITICAL CARE MEDICINE   Name: Cheyenne Boone MRN: [id number] DOB: Jan 20, 1924    ADMISSION DATE:  03/15/2015 CONSULTATION DATE:  03/15/2015  REFERRING MD :  Jamison Neighbor  CHIEF COMPLAINT:  Dysphagia  INITIAL PRESENTATION: 81 year old female with 10 day history of dysphagia. Was admitted to [facility] for what was suspected to be myasthenia gravis flare. She was started on on IVIG and mestinon, however eventually required intubation. Transferred to [facility] at family request and for possible PLEX. PCCM to admit.   STUDIES:  9/27 CT head - Stable noncontrast CT appearance of the brain since May. No acute intracranial abnormality  9/27 Barium swallow - Nondiagnostic barium swallow examination due to the patient's inability to more than tiny amounts of the barium, Possible mass in the AP window. Possible infrahilar pneumonia on the left.  9/27 CT chest - Significant coronary artery disease, mitral annulus and aortic valve disease. Tortuous aorta without aneurysm. Large hiatal hernia. No associated obstruction.  9/27 CT soft tissue neck - Moderate calcified plaque is noted in proximal right internal carotid artery; carotid ultrasound is recommended for further evaluation. Fluid is noted in the right mastoid air cells. Clinical correlation is recommended to rule out mastoiditis.  9/28 MRI brain - No acute intracranial abnormality. Chronic small vessel ischemic disease is stable since 2015. Chronic cervical spine degeneration with C3-C4 and C4-C5 spinal stenosis which does not appear significantly progressed since 2015.  9/28 Carotid doppler - Right carotid bifurcation and proximal ICA plaque, resulting in less than 50% diameter stenosis. The exam does not exclude plaque ulceration or embolization.  10/12 Echo - EF 60-65%, no AS, grade 1 diastolic dysfunction  SIGNIFICANT EVENTS: 9/27 - Admit for dysphagia  9/29 - Transfer to ICU @ [facility] for respiratory failure & intubated>>Bronchoscopy with  bilateral secretions & plugging 10/01 - Extubated 10/03 - Reintubated 10/03 - Transfer to [facility] from [facility] 10/4- HD cath placed 10/5 PLEX planned, EGD for OGT placement. PLEX stopped due to hypotension 10/7- concern for allergic rxn to UNasyn or plex citrate, improved with treatment 10/8- delirium severe--> precedex-->PEA short arrest--> woke up 10/13 - Extubated  SUBJECTIVE:  Awake and alert. Asking for food.   VITAL SIGNS: Temp:  [97.2 F (36.2 C)-98.7 F (37.1 C)] 97.2 F (36.2 C) (10/14 0741) Pulse Rate:  [65-144] 90 (10/14 0600) Resp:  [12-41] 41 (10/14 0600) BP: (107-184)/(54-111) 174/89 mmHg (10/14 0600) SpO2:  [82 %-100 %] 99 % (10/14 0600) FiO2 (%):  [30 %] 30 % (10/13 0845) Weight:  [127 lb 6.8 oz (57.8 kg)] 127 lb 6.8 oz (57.8 kg) (10/14 0500) HEMODYNAMICS:   VENTILATOR SETTINGS: Vent Mode:  [-] PRVC FiO2 (%):  [30 %] 30 % Set Rate:  [12 bmp] 12 bmp Vt Set:  [400 mL] 400 mL PEEP:  [5 cmH20] 5 cmH20 Pressure Support:  [5 cmH20] 5 cmH20 Plateau Pressure:  [17 cmH20] 17 cmH20 INTAKE / OUTPUT:  Intake/Output Summary (Last 24 hours) at 03/26/15 0757 Last data filed at 03/26/15 0600  Gross per 24 hour  Intake    490 ml  Output   2000 ml  Net  -1510 ml    PHYSICAL EXAMINATION: General: Elderly female lying in hospital bed, strength improving Neuro: Alert, follows commands, moves all ext equally HEENT: no swelling Cardiovascular: s1 s2 RRR Lungs: CTA, reduced rt base Abdomen: Soft, non-tender, non-distended Musculoskeletal: edema feet Skin: Grossly intact  LABS:  CBC  Recent Labs Lab 03/24/15 0500 03/25/15 0237 03/25/15 0515 03/26/15 0500  WBC 11.6*  --  10.7* 13.6*  HGB 8.1* 8.2* 7.4* 8.9*  HCT 24.5* 24.0* 22.9* 27.4*  PLT 185  --  194 278   Coag's No results for input(s): APTT, INR in the last 168 hours. BMET  Recent Labs Lab 03/24/15 0500 03/25/15 0237 03/25/15 0515 03/26/15 0500  NA 140 139 136 134*  K 3.4* 3.4*  3.3* 3.8  CL 99* 96* 102 97*  CO2 32  --  29 28  BUN 23* 27* 22* 21*  CREATININE 0.85 0.80 0.73 0.76  GLUCOSE 139* 116* 125* 113*   Electrolytes  Recent Labs Lab 03/20/15 0340 03/20/15 1707 03/20/15 2134 03/21/15 0502  03/24/15 0500 03/25/15 0515 03/26/15 0500  CALCIUM 8.0*  --  12.5* 9.0  < > 8.2* 8.0* 8.6*  MG 1.6* 2.8* 2.6* 2.4  --  1.8  --   --   PHOS 2.9 2.8  --  3.1  --   --   --   --   < > = values in this interval not displayed. Sepsis Markers No results for input(s): LATICACIDVEN, PROCALCITON, O2SATVEN in the last 168 hours. ABG  Recent Labs Lab 03/25/15 0416 03/25/15 1020  PHART 7.456* 7.477*  PCO2ART 36.7 37.5  PO2ART 93.7 85.0   Liver Enzymes No results for input(s): AST, ALT, ALKPHOS, BILITOT, ALBUMIN in the last 168 hours. Cardiac Enzymes  Recent Labs Lab 03/20/15 2134 03/21/15 0501 03/21/15 0905  TROPONINI <0.03 <0.03 <0.03   Glucose  Recent Labs Lab 03/25/15 0006 03/25/15 0345 03/25/15 0747 03/25/15 1141 03/25/15 1516 03/25/15 1934  GLUCAP 114* 93 117* 101* 103* 102*    Imaging Dg Chest Port 1 View  03/26/2015  CLINICAL DATA:  Acute respiratory failure with hypoxemia. Recent extubation EXAM: PORTABLE CHEST 1 VIEW COMPARISON:  03/25/2015 FINDINGS: Endotracheal tube and feeding tube is been removed. Right jugular central is catheter remains in place. Cardiomegaly is stable. Bilateral airspace disease with moderate bilateral pleural effusions show no significant change. No pneumothorax visualized. IMPRESSION: No significant change in bilateral airspace disease with moderate bilateral pleural effusions. Stable cardiomegaly. Electronically Signed   By: Myles Rosenthal M.D.   On: 03/26/2015 07:17     ASSESSMENT / PLAN:  PULMONARY OETT 9/29 > 10/1, 10/3 >>> A: Acute respiratory failure in setting myasthenia gravis flare Mucus plugging - S/P bronch & BAL 9/29 pcxr 10/7 worsening atx rt base, improved 10/8 P:  Extubated yesterday - per  family discussion will not re-intubate Plan for 5th PLEX session today Incentive spirometry NIF / VC monitoring Continue to monitor secretions  CARDIOVASCULAR CVL LIJ 10/3 > A:  H/O HTN H/O HLD Drop in BP with one of plex treatment PEA arrest 10/8 secondary to precedex, r/o AS, occult valvular dz P:  Tele Now DNR  RENAL A:  Acute Renal Failure - resolved Hypokalemia hypomag - resolved P:  Follow BMP Lasix  IV x1 today Goal net 0  GASTROINTESTINAL A:  Large hiatal hernia Dysphagia Difficult NGT placement P:  Protonix IV daily for prophylaxis SLP eval pending  HEMATOLOGIC A:  DVT prevention Leukocytosis post arrest - likely demargination Normocytic Anemia - Likely related to PLEX P:  Heparin for VTE prophylaxis SCDs Monitor CBC  INFECTIOUS A:  Aspiration PNA - H parahaemolytics on BAL Concern for unasyn allergy P:  BCx2 9/29 >>> No growth BAL 9/29 - H parahemolyticus UC - neg Respiratory Culture 10/10 >>>  Abx:  Unasyn, start date 10/04>>>10/7 Zosyn, start date 9/29 >>>10/04 Vancomycin, start date 9/29 >>>  10/04 Meropenem 10/7>>>10/12  CXR in AM  ENDOCRINE A:  Hyperglycemia  P:  Accuchecks q4hr Glu wnl  NEUROLOGIC A:  Myasthenia gravis crisis Agitation P:  Plex 5/5 today Restart mestinon per neurology Will need SLP eval Continue home Xanax Haldol prn agitation  FAMILY  - Updates: daughter updated by DF 10/7  - Inter-disciplinary family meet or Palliative Care meeting due by: 10/6, DONE by DF 10/9 =  I have had extensive discussions with family daughter. We discussed patients current circumstances and organ failures. We also discussed patient's prior wishes under circumstances such as this. Family has decided to NOT perform resuscitation if arrest but to continue current medical support for now. Also dni with planned extubation.  Summary: Strength continuing to improve s/p 4 PLEX sessions. Extubated  yesterday, tolerating well. Patient now DNR/DNI. Final PLEX session today. Stable for transfer to stepdown.  Katina Degreealeb M. Jimmey RalphParker, MD [facility] Resident PGY-2 03/26/2015 7:57 AM

## 2015-03-26 NOTE — Progress Notes (Signed)
NEURO HOSPITALIST PROGRESS NOTE   SUBJECTIVE:                                                                                                                    Extubated on 10/13. Tolerating off vent well.    OBJECTIVE:                                                                                                                           Vital signs in last 24 hours: Temp:  [97.3 F (36.3 C)-98.7 F (37.1 C)] 97.9 F (36.6 C) (10/14 0350) Pulse Rate:  [65-144] 105 (10/14 0200) Resp:  [12-40] 39 (10/14 0200) BP: (107-173)/(54-111) 159/86 mmHg (10/14 0200) SpO2:  [96 %-100 %] 99 % (10/14 0200) FiO2 (%):  [30 %] 30 % (10/13 0845) Weight:  [57.8 kg (127 lb 6.8 oz)] 57.8 kg (127 lb 6.8 oz) (10/14 0500)  Intake/Output from previous day: 10/13 0701 - 10/14 0700 In: 570 [I.V.:240; NG/GT:180; IV Piggyback:150] Out: 2000 [Urine:2000] Intake/Output this shift:   Nutritional status: Diet NPO time specified  Past Medical History  Diagnosis Date  . Hypertension   . Hypercholesteremia   . Stroke [facility])   Physical exam:  Constitutional: elderly female, intubated on the vent, in no apparent distress. Eyes: no jaundice or exophthalmos.  Head: normocephalic. Neck: supple, no bruits, no JVD. Cardiac: no murmurs. Lungs: clear. Abdomen: soft, no tender, no mass. Extremities: no edema, clubbing, or cyanosis.  Skin: no rash  Neurologic Exam:  General: NAD Mental Status: alert and awake and following some commands.  Cranial Nerves: II:  Visual fields grossly normal, pupils equal, round, reactive to light  III,IV, VI: mild left ptosis, extra-ocular motions intact bilaterally V,VII: face symmetric Motor: Moves all limbs symmetrically  Lab Results: Lab Results  Component Value Date/Time   CHOL 131 03/10/2015 08:35 PM   CHOL 154 12/17/2013 03:26 PM   Lipid Panel No results for input(s): CHOL, TRIG, HDL, CHOLHDL, VLDL, LDLCALC in the last 72  hours.  Studies/Results: Dg Chest Port 1 View  03/26/2015  CLINICAL DATA:  Acute respiratory failure with hypoxemia. Recent extubation EXAM: PORTABLE CHEST 1 VIEW COMPARISON:  03/25/2015 FINDINGS: Endotracheal tube and feeding tube is been removed. Right jugular central is catheter remains  in place. Cardiomegaly is stable. Bilateral airspace disease with moderate bilateral pleural effusions show no significant change. No pneumothorax visualized. IMPRESSION: No significant change in bilateral airspace disease with moderate bilateral pleural effusions. Stable cardiomegaly. Electronically Signed   By: Myles RosenthalJohn  Stahl M.D.   On: 03/26/2015 07:17   Dg Chest Port 1 View  03/25/2015  CLINICAL DATA:  Acute respiratory failure EXAM: PORTABLE CHEST 1 VIEW COMPARISON:  03/24/2015 FINDINGS: Endotracheal tube in good position. Right jugular catheter tip SVC unchanged. Feeding tube in the proximal stomach. Bibasilar consolidation unchanged. This may represent atelectasis or pneumonia. Bilateral effusions unchanged. Vascular congestion with possible fluid overload. IMPRESSION: Support lines remain in good position and are unchanged Bilateral airspace disease with bibasilar consolidation and bilateral effusions unchanged. Possible pneumonia versus congestive heart failure. Electronically Signed   By: Marlan Palauharles  Clark M.D.   On: 03/25/2015 07:27    MEDICATIONS                                                                                                                        Scheduled: . ALPRAZolam  0.25 mg Oral BID  . antiseptic oral rinse  7 mL Mouth Rinse QID  . chlorhexidine gluconate  15 mL Mouth Rinse BID  . heparin  5,000 Units Subcutaneous 3 times per day  . hydroxypropyl methylcellulose / hypromellose  1 drop Both Eyes TID  . latanoprost  1 drop Right Eye QHS  . pantoprazole sodium  40 mg Per Tube Daily  . pyridostigmine  30 mg Oral Q12H    ASSESSMENT/PLAN:                                                                                                             81 [facility] management of likely MG crisis with respiratory failure. Patient is intubated on the vent but alert, awake and able to follow commands. Hospital course complicated by brief arrest.   -goal 5/5 PLEX today -will slowly re-introduce mestinon, starting 30mg  BID. With history of bradycardia will start low and slowly titrate up as tolerated -will continue to follow   Elspeth Choeter Arwin Bisceglia, DO [facility] [phone]  If 7pm- 7am, please page neurology on call as listed in AMION.   03/26/2015, 7:41 AM

## 2015-03-26 NOTE — Evaluation (Signed)
Clinical/Bedside Swallow Evaluation Patient Details  Name: Cheyenne Boone MRN: [id number] Date of Birth: 09/28/1923  Today's Date: 03/26/2015 Time: SLP Start Time (ACUTE ONLY): 1132 SLP Stop Time (ACUTE ONLY): 1148 SLP Time Calculation (min) (ACUTE ONLY): 16 min  Past Medical History:  Past Medical History  Diagnosis Date  . Hypertension   . Hypercholesteremia   . Stroke [facility](HCC)    Past Surgical History:  Past Surgical History  Procedure Laterality Date  . Tonsillectomy    . Femur im nail Left 11/01/2014    Procedure: INTRAMEDULLARY (IM) NAIL FEMORAL;  Surgeon: Kennedy BuckerMichael Menz, MD;  Location: [facility] ORS;  Service: Orthopedics;  Laterality: Left;  . Esophagogastroduodenoscopy N/A 03/17/2015    Procedure: ESOPHAGOGASTRODUODENOSCOPY (EGD);  Surgeon: Carman ChingJames Edwards, MD;  [facility];  Service: Endoscopy;  Laterality: N/A;  with placement of feeding tube at bedside  . Esophagogastroduodenoscopy N/A 03/17/2015    Procedure: ESOPHAGOGASTRODUODENOSCOPY (EGD);  Surgeon: Carman ChingJames Edwards, MD;  [facility];  Service: Endoscopy;  Laterality: N/A;  guidewire/  bedside    HPI:  81 year old female with 10 day history of dysphagia. [facility] for what was suspected to be myasthenia gravis flare. She was started on on IVIG and mestinon, however eventually required intubation (9/29-10/01, reintubated 03-14-09/13). Pt had barium swallow 9/27 which did not show aspiration of small sips of thin liquids, although pt did not consume larger quantities (making study limited).    Assessment / Plan / Recommendation Clinical Impression  Pt has changes in vocal quality and delayed coughing with trials of thin liquids, and tries to stifle her coughing as she thinks it will make her cough more. She does not show overt signs of aspiration with purees, however given admission for MG flare and prolonged intubation, recommend proceeding with MBS prior to initiation of any PO diet.      Aspiration Risk  Moderate    Diet Recommendation NPO   Medication Administration: Via alternative means    Other  Recommendations Oral Care Recommendations: Oral care QID Other Recommendations: Have oral suction available    Follow Up Recommendations   tba pending further testing    Frequency and Duration  tba pending further testing      Pertinent Vitals/Pain Mild increase in RR to 32    SLP Swallow Goals     Swallow Study Prior Functional Status       General Other Pertinent Information: 81 year old female with 10 day history of dysphagia. [facility] for what was suspected to be myasthenia gravis flare. She was started on on IVIG and mestinon, however eventually required intubation (9/29-10/01, reintubated 03-14-09/13). Pt had barium swallow 9/27 which did not show aspiration of small sips of thin liquids, although pt did not consume larger quantities (making study limited).  Type of Study: Bedside swallow evaluation Previous Swallow Assessment: none in chart Diet Prior to this Study: NPO Temperature Spikes Noted: No Respiratory Status: Supplemental O2 delivered via (comment) ([place]) History of Recent Intubation: Yes Length of Intubations (days): 13 days (across two intubations) Date extubated: 03/25/15 Behavior/Cognition: Alert;Cooperative;Pleasant mood;Requires cueing Oral Cavity - Dentition: Edentulous;Other (Comment) (pt has dentures, but did not place them for this eval) Self-Feeding Abilities: Able to feed self;Needs assist Patient Positioning: Upright in chair/Tumbleform Baseline Vocal Quality: Hoarse;Low vocal intensity;Other (comment) (high in pitch) Volitional Cough: Weak;Congested Volitional Swallow: Able to elicit    Oral/Motor/Sensory Function Overall Oral Motor/Sensory Function: Other (comment) (generalized weakness)   Ice Chips Ice chips: Not  tested   Thin Liquid Thin Liquid: Impaired Presentation: Cup;Self Fed;Spoon Pharyngeal  Phase  Impairments: Suspected delayed Swallow;Multiple swallows;Wet Vocal Quality;Cough - Delayed    Nectar Thick Nectar Thick Liquid: Not tested   Honey Thick Honey Thick Liquid: Not tested   Puree Puree: Impaired Presentation: Self Fed;Spoon Pharyngeal Phase Impairments: Suspected delayed Swallow   Solid   Solid: Not tested      Maxcine Ham, M.A. CCC-SLP [phone]  Maxcine Ham 03/26/2015,12:07 PM

## 2015-03-26 NOTE — Progress Notes (Signed)
Patient confused and stated feeling SOB.  Patient's breathing is some what labored with auditory coarse breath sounds.  [name], MD notified and at bedside.  Lasix given to try and alleviate SOB.  I will continue to monitor.

## 2015-03-27 ENCOUNTER — Inpatient Hospital Stay (HOSPITAL_COMMUNITY): Payer: Commercial Managed Care - HMO

## 2015-03-27 DIAGNOSIS — E876 Hypokalemia: Secondary | ICD-10-CM

## 2015-03-27 LAB — GLUCOSE, CAPILLARY
GLUCOSE-CAPILLARY: 108 mg/dL — AB (ref 65–99)
GLUCOSE-CAPILLARY: 108 mg/dL — AB (ref 65–99)
GLUCOSE-CAPILLARY: 98 mg/dL (ref 65–99)
GLUCOSE-CAPILLARY: 105 mg/dL — AB (ref 65–99)
Glucose-Capillary: 102 mg/dL — ABNORMAL HIGH (ref 65–99)
Glucose-Capillary: 86 mg/dL (ref 65–99)

## 2015-03-27 LAB — BASIC METABOLIC PANEL
Anion gap: 10 (ref 5–15)
BUN: 21 mg/dL — AB (ref 6–20)
CALCIUM: 9 mg/dL (ref 8.9–10.3)
CO2: 29 mmol/L (ref 22–32)
CREATININE: 0.84 mg/dL (ref 0.44–1.00)
Chloride: 95 mmol/L — ABNORMAL LOW (ref 101–111)
GFR calc Af Amer: >60 mL/min (ref >60)
GFR calc non Af Amer: 60 mL/min — ABNORMAL LOW (ref >60)
GLUCOSE: 122 mg/dL — AB (ref 65–99)
Potassium: 3.5 mmol/L (ref 3.5–5.1)
Sodium: 134 mmol/L — ABNORMAL LOW (ref 135–145)

## 2015-03-27 LAB — CBC
HCT: 31.2 % — ABNORMAL LOW (ref 36.0–46.0)
Hemoglobin: 10.1 g/dL — ABNORMAL LOW (ref 12.0–15.0)
MCH: 30.5 pg (ref 26.0–34.0)
MCHC: 32.4 g/dL (ref 30.0–36.0)
MCV: 94.3 fL (ref 78.0–100.0)
PLATELETS: 362 10*3/uL (ref 150–400)
RBC: 3.31 MIL/uL — AB (ref 3.87–5.11)
RDW: 16.6 % — AB (ref 11.5–15.5)
WBC: 16.4 10*3/uL — ABNORMAL HIGH (ref 4.0–10.5)

## 2015-03-27 MED ORDER — ENOXAPARIN SODIUM 40 MG/0.4ML ~~LOC~~ SOLN
40.0000 mg | SUBCUTANEOUS | Status: DC
  Administered 2015-03-27 – 2015-04-06 (×11): 40 mg via SUBCUTANEOUS
  Filled 2015-03-27 (×12): qty 0.4

## 2015-03-27 MED ORDER — ALBUTEROL SULFATE (2.5 MG/3ML) 0.083% IN NEBU
2.5000 mg | INHALATION_SOLUTION | RESPIRATORY_TRACT | Status: DC | PRN
  Administered 2015-03-27: 2.5 mg via RESPIRATORY_TRACT
  Filled 2015-03-27: qty 3

## 2015-03-27 MED ORDER — POTASSIUM CHLORIDE 10 MEQ/100ML IV SOLN: 10.0000 meq | INTRAVENOUS | Status: DC

## 2015-03-27 MED ORDER — POTASSIUM CHLORIDE 10 MEQ/50ML IV SOLN
10.0000 meq | INTRAVENOUS | Status: AC
  Administered 2015-03-27 (×3): 10 meq via INTRAVENOUS
  Filled 2015-03-27 (×3): qty 50

## 2015-03-27 NOTE — Progress Notes (Signed)
[facility] TEAM 1 - Stepdown/ICU TEAM PROGRESS NOTE  Cheyenne Boone ZOX:[id number] DOB: 1923-09-08 DOA: 03/15/2015 PCP: Barbette Reichmann, MD  Admit HPI / Brief Narrative: 81 year old female with 10 day history of dysphagia. Was admitted to [facility] for what was suspected to be myasthenia gravis flare. She was started on on IVIG and mestinon, however eventually required intubation. Transferred to [facility] at family request for possible PLEX.  Significant Events: 9/27 - Admit for dysphagia  9/29 - Transfer to ICU @ [facility] for respiratory failure & intubated>>Bronchoscopy with bilateral secretions & plugging 10/01 - Extubated 10/03 - Reintubated 10/03 - Transfer to [facility] from [facility] 10/4 - HD cath placed 10/5 PLEX planned, EGD for OGT placement. PLEX stopped due to hypotension 10/7 - concern for allergic rxn to UNasyn or plex citrate, improved with treatment 10/8 - delirium severe--> precedex-->PEA short arrest--> woke up 10/13 - Extubated  HPI/Subjective: The pt is alert but confused and not able to provide a reliable hx.  She is breathing 30+ times per minute, but she herself denies feeling sob at all.  She also denies cp, n/v, or abdom pain, but her hx is of ?reliability.  There is no family present at the time of my exam.    Assessment/Plan:  Acute respiratory failure in setting myasthenia gravis flare NIF has improved somewhat, but on exam pt is taking rapid shallow breaths presently - she is not hypoxic at this time, but does not appear comfortable - will give trial of BIPAP - recognize pt is NCB/DNI  Myasthenia gravis crisis Neurology following - completed 4 rounds of PLEX - mestinon slowly being reintroduced - not felt to be safe for return to PLEX in absence of secured airway given hx of tongue swelling w/ citrate  Allergy to citrate w/ tongue swelling 5th PLEX tx cancelled as pt no longer intubated   Agitation Appears to have resolved  Mucus plugging S/P bronch & BAL  9/29  Aspiration PNA - H parahaemolytics on BAL Has completed a course of abx tx   Acute Renal Failure  Resolved  Normocytic Anemia Likely related to PLEX - Hgb currently climbing   Large hiatal hernia  Dysphagia Cleared for D1 nectar diet per SLP   Hyperglycemia CBG well controlled   Hypokalemia Supplement as needed to keep at ~4.0  Hypomag Recheck in AM   Severe malnutrition in context of chronic illness  Code Status: NO CODE - DNR Family Communication: no family present at time of exam Disposition Plan: SDU   Consultants: PCCM  Neuro   Antibiotics: Unasyn 10/04>10/7 Zosyn 9/29>10/04 Vancomycin 9/29 >10/04 Meropenem 10/7>10/12  DVT prophylaxis: lovenox  Objective: Blood pressure 152/67, pulse 85, temperature 98.1 F (36.7 C), temperature source Axillary, resp. rate 28, height  (1.575 m), weight 57.7 kg (127 lb 3.3 oz), SpO2 100 %.  Intake/Output Summary (Last 24 hours) at 03/27/15 1132 Last data filed at 03/27/15 1000  Gross per 24 hour  Intake    130 ml  Output   1875 ml  Net  -1745 ml   Exam: General: tachypneic - alert but not oriented  Lungs: Clear to auscultation bilaterally without wheezes or crackles - poor air movement th/o - shallow rapid breathing  Cardiovascular: Regular rate and rhythm without murmur gallop or rub  Abdomen: Nontender, nondistended, soft, bowel sounds positive, no rebound, no ascites, no appreciable mass Extremities: No significant cyanosis, clubbing, or edema bilateral lower extremities  Data Reviewed: Basic Metabolic Panel:  Recent Labs Lab 03/20/15  1707  03/20/15 2134 03/21/15 0502  03/23/15 0335 03/24/15 0500 03/25/15 0237 03/25/15 0515 03/26/15 0500 03/27/15 0530  NA  --   < > 137 140  < > 139 140 139 136 134* 134*  K  --   < > 3.4* 3.5  < > 3.2* 3.4* 3.4* 3.3* 3.8 3.5  CL  --   < > 104 105  < > 105 99* 96* 102 97* 95*  CO2  --   < > 28 27  < > 28 32  --  GLUCOSE  --   < > 330* 158*  < >  146* 139* 116* 125* 113* 122*  BUN  --   < > 16 17  < > 22* 23* 27* 22* 21* 21*  CREATININE  --   < > 0.99 0.87  < > 0.79 0.85 0.80 0.73 0.76 0.84  CALCIUM  --   < > 12.5* 9.0  < > 8.3* 8.2*  --  8.0* 8.6* 9.0  MG 2.8*  --  2.6* 2.4  --   --  1.8  --   --   --   --   PHOS 2.8  --   --  3.1  --   --   --   --   --   --   --   < > = values in this interval not displayed.  CBC:  Recent Labs Lab 03/22/15 0500  03/23/15 0335 03/24/15 0500 03/25/15 0237 03/25/15 0515 03/26/15 0500 03/27/15 0530  WBC 9.7  --  12.2* 11.6*  --  10.7* 13.6* 16.4*  NEUTROABS 6.8  --   --   --   --   --   --   --   HGB 8.0*  < > 7.1* 8.1* 8.2* 7.4* 8.9* 10.1*  HCT 24.8*  < > 22.0* 24.5* 24.0* 22.9* 27.4* 31.2*  MCV 94.3  --  94.4 92.8  --  94.6 95.5 94.3  PLT 163  --  165 185  --  194 278 362  < > = values in this interval not displayed.  Liver Function Tests: No results for input(s): AST, ALT, ALKPHOS, BILITOT, PROT, ALBUMIN in the last 168 hours. No results for input(s): LIPASE, AMYLASE in the last 168 hours. No results for input(s): AMMONIA in the last 168 hours.  Cardiac Enzymes:  Recent Labs Lab 03/20/15 2134 03/21/15 0501 03/21/15 0905  TROPONINI <0.03 <0.03 <0.03    CBG:  Recent Labs Lab 03/26/15 1645 03/26/15 1916 03/27/15 0013 03/27/15 0358 03/27/15 0836  GLUCAP 89 106* 102* 86 108*    Recent Results (from the past 240 hour(s))  Culture, respiratory (NON-Expectorated)     Status: None   Collection Time: 03/22/15 12:56 PM  Result Value Ref Range Status   Specimen Description TRACHEAL ASPIRATE  Final   Special Requests Normal  Final   Gram Stain   Final    FEW WBC PRESENT,BOTH PMN AND MONONUCLEAR NO SQUAMOUS EPITHELIAL CELLS SEEN NO ORGANISMS SEEN Performed at [facility]    Culture   Final    FEW YEAST CONSISTENT WITH CANDIDA SPECIES Performed at [facility]    Report Status 03/25/2015 FINAL  Final     Studies:   Recent x-ray studies have been  reviewed in detail by the Attending Physician  Scheduled Meds:  Scheduled Meds: . ALPRAZolam  0.25 mg Oral BID  . antiseptic oral rinse  7 mL Mouth Rinse QID  .  chlorhexidine gluconate  15 mL Mouth Rinse BID  . heparin  5,000 Units Subcutaneous 3 times per day  . hydroxypropyl methylcellulose / hypromellose  1 drop Both Eyes TID  . latanoprost  1 drop Right Eye QHS  . nystatin  5 mL Oral QID  . pyridostigmine  30 mg Oral Q12H    Time spent on care of this patient: 35 mins   Shann Merrick T , MD   [facility] Office  [phone] Pager - Text Page per Loretha [email or web address] as per below:  On-Call/Text Page:      [email or web address]      password [email or web address]  If 7PM-7AM, please contact night-coverage [email or web address] Password [email or web address] 03/27/2015, 11:32 AM   LOS: 12 days

## 2015-03-27 NOTE — Progress Notes (Addendum)
SLP Cancellation Note  Patient Details Name: Cheyenne GoldKatherine M Tribby MRN: [id number] DOB: 08/07/1923   Cancelled treatment:        Planned to follow up after MBS yesterday and initiation of po's. Pt currently on Bipap. RN reported pt tolerated puree and nectar well but decreased appetite. RN stated MD suspects she may be having a Myasthenia Gravis flare. ST will continue efforts.     Royce MacadamiaLitaker, Lazaro Isenhower Willis 03/27/2015, 3:06 PM  Breck CoonsLisa Willis Lonell FaceLitaker M.Ed [employer]P Pager [phone]

## 2015-03-27 NOTE — Progress Notes (Signed)
NEURO HOSPITALIST PROGRESS NOTE   SUBJECTIVE:                                                                                                                    Extubated on 10/13. 5th PLEX was held due to concern for citrate allergy in setting of no longer being intubated and currently DRN/I.   OBJECTIVE:                                                                                                                           Vital signs in last 24 hours: Temp:  [97 F (36.1 C)-98.1 F (36.7 C)] 98.1 F (36.7 C) (10/15 0700) Pulse Rate:  [83-114] 85 (10/15 0815) Resp:  [27-37] 28 (10/15 0815) BP: (114-186)/(48-118) 152/67 mmHg (10/15 0815) SpO2:  [84 %-100 %] 100 % (10/15 0815) Weight:  [57.6 kg (126 lb 15.8 oz)-57.7 kg (127 lb 3.3 oz)] 57.7 kg (127 lb 3.3 oz) (10/15 0400)  Intake/Output from previous day: 10/14 0701 - 10/15 0700 In: 90 [I.V.:90] Out: 2025 [Urine:2025] Intake/Output this shift: Total I/O In: -  Out: 200 [Urine:200] Nutritional status: DIET - DYS 1 Room service appropriate?: Yes; Fluid consistency:: Nectar Thick  Past Medical History  Diagnosis Date  . Hypertension   . Hypercholesteremia   . Stroke [facility])    Neurologic Exam:  General: NAD Mental Status: Eyes closed, lethargic, not following commands Cranial Nerves: II:  Visual fields grossly normal, pupils equal, round, reactive to light  III,IV, VI: extra-ocular motions intact bilaterally V,VII: face symmetric Motor: Not following commands but appears to move all extremities symmetrically and against noxious stimuli  Lab Results: Lab Results  Component Value Date/Time   CHOL 131 03/10/2015 08:35 PM   CHOL 154 12/17/2013 03:26 PM   Lipid Panel No results for input(s): CHOL, TRIG, HDL, CHOLHDL, VLDL, LDLCALC in the last 72 hours.  Studies/Results: Dg Chest Port 1 View  03/26/2015  CLINICAL DATA:  Acute respiratory failure with hypoxemia. Recent extubation  EXAM: PORTABLE CHEST 1 VIEW COMPARISON:  03/25/2015 FINDINGS: Endotracheal tube and feeding tube is been removed. Right jugular central is catheter remains in place. Cardiomegaly is stable. Bilateral airspace disease with moderate bilateral pleural effusions show no significant change. No pneumothorax visualized. IMPRESSION: No  significant change in bilateral airspace disease with moderate bilateral pleural effusions. Stable cardiomegaly. Electronically Signed   By: Myles RosenthalJohn  Stahl M.D.   On: 03/26/2015 07:17   Dg Swallowing Func-speech Pathology  03/26/2015  Objective Swallowing Evaluation:   Patient Details Name: Heath GoldKatherine M Fugitt MRN: [id number] Date of Birth: 03/25/1924 Today's Date: 03/26/2015 Time: SLP Start Time (ACUTE ONLY): 1422-SLP Stop Time (ACUTE ONLY): 1443 SLP Time Calculation (min) (ACUTE ONLY): 21 min Past Medical History: Past Medical History Diagnosis Date . Hypertension  . Hypercholesteremia  . Stroke [facility](HCC)  Past Surgical History: Past Surgical History Procedure Laterality Date . Tonsillectomy   . Femur im nail Left 11/01/2014   Procedure: INTRAMEDULLARY (IM) NAIL FEMORAL;  Surgeon: Kennedy BuckerMichael Menz, MD;  Location: [facility] ORS;  Service: Orthopedics;  Laterality: Left; . Esophagogastroduodenoscopy N/A 03/17/2015   Procedure: ESOPHAGOGASTRODUODENOSCOPY (EGD);  Surgeon: Carman ChingJames Edwards, MD;  [facility];  Service: Endoscopy;  Laterality: N/A;  with placement of feeding tube at bedside . Esophagogastroduodenoscopy N/A 03/17/2015   Procedure: ESOPHAGOGASTRODUODENOSCOPY (EGD);  Surgeon: Carman ChingJames Edwards, MD;  [facility];  Service: Endoscopy;  Laterality: N/A;  guidewire/ bedside  HPI: Other Pertinent Information: 81 year old female with 10 day history of dysphagia. [facility] for what was suspected to be myasthenia gravis flare. She was started on on IVIG and mestinon, however eventually required intubation (9/29-10/01, reintubated 03-14-09/13). Pt had barium swallow 9/27 which did not  show aspiration of small sips of thin liquids, although pt did not consume larger quantities (making study limited).  No Data Recorded Assessment / Plan / Recommendation CHL IP CLINICAL IMPRESSIONS 03/26/2015 Therapy Diagnosis Mild feeding disorder;Mild pharyngeal phase dysphagia;Moderate pharyngeal phase dysphagia;Moderate feeding disorder  Clinical Impression Pt has a mild-moderate oropharyngeal dysphagia due to sensorimotor issues. Lingual manipulation is weak with disorganized manipulation that results in sublingual pooling and premature spillage. She has a delay in swallow initiation with resultant silent penetration before the swallow when consuming small amounts of thin liquids. Cued cough was not effective at clearing penetrates, which remained in the vestibule throughout the remainder of the study. Pt had good airway protection with nectar thick liquids and solids, although prolonged mastication and premature spillage to the pyriform sinuses does put pt at a higher risk with soft solids. Pt also has increased residuals that remain in the valleculae with more solid consistencies. Given the above, and with consideration of pt fatigue, would recommend Dys 1 diet and nectar thick liquids. SLP to follow for tolerance and readiness to advance.   CHL IP TREATMENT RECOMMENDATION 03/26/2015 Treatment Recommendations Therapy as outlined in treatment plan below   CHL IP DIET RECOMMENDATION 03/26/2015 SLP Diet Recommendations Dysphagia 1 (Puree);Nectar Liquid Administration via (None) Medication Administration Crushed with puree Compensations Minimize environmental distractions;Slow rate;Small sips/bites Postural Changes and/or Swallow Maneuvers (None)   CHL IP OTHER RECOMMENDATIONS 03/26/2015 Recommended Consults (None) Oral Care Recommendations Oral care BID Other Recommendations Order thickener from pharmacy;Prohibited food (jello, ice cream, thin soups);Remove water pitcher   No flowsheet data found.  CHL IP  FREQUENCY AND DURATION 03/26/2015 Speech Therapy Frequency (ACUTE ONLY) min 2x/week Treatment Duration 2 weeks   Pertinent Vitals/Pain: n/a  SLP Swallow Goals No flowsheet data found. No flowsheet data found.   CHL IP REASON FOR REFERRAL 03/26/2015 Reason for Referral Objectively evaluate swallowing function   CHL IP ORAL PHASE 03/26/2015 Oral Phase Impaired   CHL IP PHARYNGEAL PHASE 03/26/2015 Pharyngeal Phase Impaired   CHL IP CERVICAL ESOPHAGEAL PHASE 03/26/2015 Cervical Esophageal Phase [facility]  Maxcine Ham, M.A. CCC-SLP [phone] Maxcine Ham 03/26/2015, 3:33 PM    MEDICATIONS                                                                                                                        Scheduled: . ALPRAZolam  0.25 mg Oral BID  . antiseptic oral rinse  7 mL Mouth Rinse QID  . chlorhexidine gluconate  15 mL Mouth Rinse BID  . heparin  5,000 Units Subcutaneous 3 times per day  . hydroxypropyl methylcellulose / hypromellose  1 drop Both Eyes TID  . latanoprost  1 drop Right Eye QHS  . nystatin  5 mL Oral QID  . pyridostigmine  30 mg Oral Q12H    ASSESSMENT/PLAN:                                                                                                            81 y/o transferred to [facility] for further management of likely MG crisis with respiratory failure. Patient is intubated on the vent but alert, awake and able to follow commands. Hospital course complicated by brief arrest. 5th dose of PLEX held due to risk of allergic reaction in setting of being intubated.   Currently more lethargic this morning. Will need to monitor closely.   -will hold 5th round of PLEX -will slowly re-introduce mestinon, starting  BID. With history of bradycardia will start low and slowly titrate up as tolerated -will continue to follow   Elspeth Cho, DO [facility] [phone]  If 7pm- 7am, please page neurology on call as listed in AMION.   03/27/2015, 8:39 AM

## 2015-03-27 NOTE — Progress Notes (Signed)
Spoke with pharmacy and Dr. Hosie PoissonSumner about pt having an allergy to citrate(itching and tonge swelling). Pt no longer intubated. Concerned with this since her airway is no longer protected. Dr. Hosie PoissonSumner states to hold TPE for now until further notice.

## 2015-03-28 LAB — COMPREHENSIVE METABOLIC PANEL
ALT: 13 U/L — AB (ref 14–54)
AST: 16 U/L (ref 15–41)
Albumin: 2.9 g/dL — ABNORMAL LOW (ref 3.5–5.0)
Alkaline Phosphatase: 68 U/L (ref 38–126)
Anion gap: 8 (ref 5–15)
BUN: 22 mg/dL — ABNORMAL HIGH (ref 6–20)
CHLORIDE: 99 mmol/L — AB (ref 101–111)
CO2: 29 mmol/L (ref 22–32)
CREATININE: 0.92 mg/dL (ref 0.44–1.00)
Calcium: 8.5 mg/dL — ABNORMAL LOW (ref 8.9–10.3)
GFR, EST NON AFRICAN AMERICAN: 54 mL/min — AB (ref >60)
Glucose, Bld: 108 mg/dL — ABNORMAL HIGH (ref 65–99)
POTASSIUM: 3.8 mmol/L (ref 3.5–5.1)
SODIUM: 136 mmol/L (ref 135–145)
Total Bilirubin: 2.1 mg/dL — ABNORMAL HIGH (ref 0.3–1.2)
Total Protein: 4.9 g/dL — ABNORMAL LOW (ref 6.5–8.1)

## 2015-03-28 LAB — GLUCOSE, CAPILLARY
GLUCOSE-CAPILLARY: 100 mg/dL — AB (ref 65–99)
GLUCOSE-CAPILLARY: 101 mg/dL — AB (ref 65–99)
GLUCOSE-CAPILLARY: 103 mg/dL — AB (ref 65–99)
GLUCOSE-CAPILLARY: 107 mg/dL — AB (ref 65–99)
Glucose-Capillary: 98 mg/dL (ref 65–99)
Glucose-Capillary: 109 mg/dL — ABNORMAL HIGH (ref 65–99)

## 2015-03-28 LAB — CBC
HEMATOCRIT: 25.1 % — AB (ref 36.0–46.0)
HEMOGLOBIN: 8.1 g/dL — AB (ref 12.0–15.0)
MCH: 30.7 pg (ref 26.0–34.0)
MCHC: 32.3 g/dL (ref 30.0–36.0)
MCV: 95.1 fL (ref 78.0–100.0)
PLATELETS: 313 10*3/uL (ref 150–400)
RBC: 2.64 MIL/uL — ABNORMAL LOW (ref 3.87–5.11)
RDW: 16.7 % — ABNORMAL HIGH (ref 11.5–15.5)
WBC: 10.8 10*3/uL — AB (ref 4.0–10.5)

## 2015-03-28 LAB — MAGNESIUM: MAGNESIUM: 2 mg/dL (ref 1.7–2.4)

## 2015-03-28 MED ORDER — CETYLPYRIDINIUM CHLORIDE 0.05 % MT LIQD
7.0000 mL | Freq: Two times a day (BID) | OROMUCOSAL | Status: DC
  Administered 2015-03-29 – 2015-04-07 (×19): 7 mL via OROMUCOSAL

## 2015-03-28 MED ORDER — CHLORHEXIDINE GLUCONATE 0.12 % MT SOLN
15.0000 mL | Freq: Two times a day (BID) | OROMUCOSAL | Status: DC
  Administered 2015-03-28 – 2015-04-07 (×18): 15 mL via OROMUCOSAL
  Filled 2015-03-28 (×21): qty 15

## 2015-03-28 NOTE — Progress Notes (Signed)
Subjective: Patient had no complaints other than feeling tired. Fifth treatment of plasma exchange was deferred due to concerns for citrate allergy and patient has improved and has been extubated.  Objective: Current vital signs: BP 149/72 mmHg  Pulse 92  Temp(Src) 98.5 F (36.9 C) (Axillary)  Resp 25  Ht 5\' 2"  (1.575 m)  Wt 58.6 kg (129 lb 3 oz)  BMI 23.62 kg/m2  SpO2 100%  Neurologic Exam: Patient was alert and in no acute distress. Mental status was normal. She was somewhat tachypneic, but otherwise in no distress. Extraocular movements were full and conjugate with no subjective diplopia in any field of gaze. No facial weakness was noted. Neck flexor strength was 4-/5, and extensors strength was 5/5. She had mild 4/5 weakness of right and left deltoid muscles, otherwise strength of upper extremities was normal. Strength of lower extremities was normal except for mild left hip flexor weakness and minimal right hip flexor weakness.  Medications: I have reviewed the patient's current medications.  Assessment/Plan: 81 year old lady with myasthenia gravis with crisis, improved with plasma exchange and now extubated and on BiPAP. Plasma exchanges been suspended after 4 treatments due to concerns for citrate allergy.  Recommendations: 1. Continue suspension of plasma exchange treatments 2. Increase Mestinon to 30 mg every 6 hours 3. Continue physical therapy and SLP intervention  We will continue to follow this patient with you.  C.R. Roseanne RenoStewart, MD [facility] [phone]  03/28/2015  9:35 AM

## 2015-03-28 NOTE — Progress Notes (Addendum)
Patient experience a short run of SVT asymptomatic.  Physician updated received no new orders.  Will continue to monitor.

## 2015-03-28 NOTE — Progress Notes (Signed)
[facility] TEAM 1 - Stepdown/ICU TEAM PROGRESS NOTE  HARRYETTE SHUART OZH:[id number] DOB: [birth year] DOA: 03/15/2015 PCP: Barbette Reichmann, MD  Admit HPI / Brief Narrative: 81 year old female with 10 day history of dysphagia. Was admitted to [facility] for what was suspected to be myasthenia gravis flare. She was started on on IVIG and mestinon, however eventually required intubation. Transferred to [facility] at family request for possible PLEX.  Significant Events: 9/27 - Admit for dysphagia  9/29 - Transfer to ICU @ [facility] for respiratory failure & intubated>>Bronchoscopy with bilateral secretions & plugging 10/01 - Extubated 10/03 - Reintubated 10/03 - Transfer [facility] from [facility] 10/4 - HD cath placed 10/5 PLEX planned, EGD for OGT placement. PLEX stopped due to hypotension 10/7 - concern for allergic rxn to UNasyn or plex citrate, improved with treatment 10/8 - delirium severe--> precedex-->PEA short arrest--> woke up 10/13 - Extubated  HPI/Subjective: The patient appears to be tolerating BiPAP quite well.  No attempt was made to liberate her from BiPAP earlier today but she very quickly developed tachypnea and had to return to BiPAP.  She otherwise has no complaints at the present time and appears to be resting comfortably.  Assessment/Plan:  Acute respiratory failure in setting myasthenia gravis flare NIF had improved somewhat, but on exam pt is taking rapid shallow breaths when not on BiPAP - continue BiPAP today and attempt to liberate her from it again tomorrow and when necessary - recognize pt is NCB/DNI - plan to stop BiPAP to allow meals  Myasthenia gravis crisis Neurology following - completed 4 rounds of PLEX - mestinon dose increased today - not felt to be safe for return to PLEX in absence of secured airway given hx of tongue swelling w/ citrate  Allergy to citrate w/ tongue swelling 5th PLEX tx cancelled as pt no longer intubated   Agitation Appears to have  resolved  Mucus plugging S/P bronch & BAL 9/29  Aspiration PNA - H parahaemolytics on BAL Has completed a course of abx tx   Acute Renal Failure  Resolved  Normocytic Anemia No evidence of acute blood loss - hemoglobin waxing and waning - follow trend  Large hiatal hernia  Dysphagia Cleared for D1 nectar diet per SLP   Hyperglycemia CBG well controlled   Hypokalemia Supplement as needed to keep at ~4.0  Hypomag Magnesium at goal  Severe malnutrition in context of chronic illness  Code Status: NO CODE - DNR Family Communication: no family present at time of exam Disposition Plan: SDU   Consultants: PCCM  Neuro   Antibiotics: Unasyn 10/04>10/7 Zosyn 9/29>10/04 Vancomycin 9/29 >10/04 Meropenem 10/7>10/12  DVT prophylaxis: lovenox  Objective: Blood pressure 126/58, pulse 84, temperature 98.2 F (36.8 C), temperature source Axillary, resp. rate 25, height  (1.575 m), weight 58.6 kg (129 lb 3 oz), SpO2 100 %.  Intake/Output Summary (Last 24 hours) at 03/28/15 1514 Last data filed at 03/28/15 1100  Gross per 24 hour  Intake   1220 ml  Output    325 ml  Net    895 ml   Exam: General: Appears comfortable on BiPAP Lungs: Clear to auscultation bilaterally without wheezes or crackles - good air movement throughout with use of BiPAP Cardiovascular: Regular rate and rhythm without murmur  Abdomen: Nontender, nondistended, soft, bowel sounds positive, no rebound, no ascites, no appreciable mass Extremities: No significant cyanosis, clubbing, edema bilateral lower extremities  Data Reviewed: Basic Metabolic Panel:  Recent Labs Lab 03/24/15 0500 03/25/15 0237 03/25/15  0515 03/26/15 0500 03/27/15 0530 03/28/15 0620  NA 140 139 136 134* 134* 136  K 3.4* 3.4* 3.3* 3.8 3.5 3.8  CL 99* 96* 102 97* 95* 99*  CO2 32  --  29 28 29 29   GLUCOSE 139* 116* 125* 113* 122* 108*  BUN 23* 27* 22* 21* 21* 22*  CREATININE 0.85 0.80 0.73 0.76 0.84 0.92  CALCIUM 8.2*   --  8.0* 8.6* 9.0 8.5*  MG 1.8  --   --   --   --  2.0    CBC:  Recent Labs Lab 03/22/15 0500  03/24/15 0500 03/25/15 0237 03/25/15 0515 03/26/15 0500 03/27/15 0530 03/28/15 0620  WBC 9.7  < > 11.6*  --  10.7* 13.6* 16.4* 10.8*  NEUTROABS 6.8  --   --   --   --   --   --   --   HGB 8.0*  < > 8.1* 8.2* 7.4* 8.9* 10.1* 8.1*  HCT 24.8*  < > 24.5* 24.0* 22.9* 27.4* 31.2* 25.1*  MCV 94.3  < > 92.8  --  94.6 95.5 94.3 95.1  PLT 163  < > 185  --  194 278 362 313  < > = values in this interval not displayed.  Liver Function Tests:  Recent Labs Lab 03/28/15 0620  AST 16  ALT 13*  ALKPHOS 68  BILITOT 2.1*  PROT 4.9*  ALBUMIN 2.9*    CBG:  Recent Labs Lab 03/27/15 1523 03/27/15 1938 03/27/15 2330 03/28/15 0329 03/28/15 0733  GLUCAP 98 105* 101* 107* 98    Recent Results (from the past 240 hour(s))  Culture, respiratory (NON-Expectorated)     Status: None   Collection Time: 03/22/15 12:56 PM  Result Value Ref Range Status   Specimen Description TRACHEAL ASPIRATE  Final   Special Requests Normal  Final   Gram Stain   Final    FEW WBC PRESENT,BOTH PMN AND MONONUCLEAR NO SQUAMOUS EPITHELIAL CELLS SEEN NO ORGANISMS SEEN Performed at [facility]    Culture   Final    FEW YEAST CONSISTENT WITH CANDIDA SPECIES Performed at [facility]    Report Status 03/25/2015 FINAL  Final     Studies:   Recent x-ray studies have been reviewed in detail by the Attending Physician  Scheduled Meds:  Scheduled Meds: . ALPRAZolam  0.25 mg Oral BID  . antiseptic oral rinse  7 mL Mouth Rinse QID  . chlorhexidine gluconate  15 mL Mouth Rinse BID  . enoxaparin (LOVENOX) injection  40 mg Subcutaneous Q24H  . hydroxypropyl methylcellulose / hypromellose  1 drop Both Eyes TID  . latanoprost  1 drop Right Eye QHS  . nystatin  5 mL Oral QID  . pyridostigmine  30 mg Oral Q12H    Time spent on care of this patient: 25 mins   [facility] Healthcare SystemMCCLUNG,JEFFREY T , MD   [facility] Office  [phone] Pager - Text Page per Loretha StaplerAmion as per below:  On-Call/Text Page:      [email or web address]      password [email or web address]  If 7PM-7AM, please contact night-coverage [email or web address] Password [email or web address] 03/28/2015, 3:14 PM   LOS: 13 days

## 2015-03-29 LAB — GLUCOSE, CAPILLARY
GLUCOSE-CAPILLARY: 111 mg/dL — AB (ref 65–99)
GLUCOSE-CAPILLARY: 99 mg/dL (ref 65–99)
GLUCOSE-CAPILLARY: 104 mg/dL — AB (ref 65–99)
GLUCOSE-CAPILLARY: 91 mg/dL (ref 65–99)

## 2015-03-29 LAB — CBC
HCT: 24.6 % — ABNORMAL LOW (ref 36.0–46.0)
Hemoglobin: 8 g/dL — ABNORMAL LOW (ref 12.0–15.0)
MCH: 31 pg (ref 26.0–34.0)
MCHC: 32.5 g/dL (ref 30.0–36.0)
MCV: 95.3 fL (ref 78.0–100.0)
PLATELETS: 316 10*3/uL (ref 150–400)
RBC: 2.58 MIL/uL — AB (ref 3.87–5.11)
RDW: 16.4 % — AB (ref 11.5–15.5)
WBC: 9.7 10*3/uL (ref 4.0–10.5)

## 2015-03-29 MED ORDER — PYRIDOSTIGMINE BROMIDE 60 MG/5ML PO SYRP
45.0000 mg | ORAL_SOLUTION | Freq: Four times a day (QID) | ORAL | Status: DC
  Administered 2015-03-29 – 2015-04-01 (×11): 45.6 mg via ORAL
  Filled 2015-03-29 (×16): qty 3.8

## 2015-03-29 MED ORDER — LORAZEPAM 2 MG/ML IJ SOLN
0.5000 mg | INTRAMUSCULAR | Status: DC | PRN
  Administered 2015-03-29 – 2015-03-30 (×3): 1 mg via INTRAVENOUS
  Filled 2015-03-29 (×2): qty 1

## 2015-03-29 MED ORDER — LORAZEPAM 2 MG/ML IJ SOLN
INTRAMUSCULAR | Status: AC
  Filled 2015-03-29: qty 1

## 2015-03-29 NOTE — Progress Notes (Signed)
Pt is now on BiPAP. once assessed noted increase WOB, increase RR, and O2 saturations at 91% with coarse crackles and diminished in bases. Pt tolerates mask well. RN aware of WOB event and pt on BiPAP. RT will continue to monitor pt through out the night.

## 2015-03-29 NOTE — Progress Notes (Signed)
Subjective: Patient remains on BiPAP. She had no new complaints. Mestinon was increased to 30 mg every 6 hours yesterday with no side effects. Patient doesn't feel any improvement in breathing. Not clear if strength of extremities improved.  Objective: Current vital signs: BP 158/73 mmHg  Pulse 86  Temp(Src) 98.2 F (36.8 C) (Axillary)  Resp 26  Ht 4\' 8"  (1.422 m)  Wt 55.656 kg (122 lb 11.2 oz)  BMI 27.52 kg/m2  SpO2 96%  Neurologic Exam: Patient was alert and in no acute distress. Mental status was normal. Extraocular movements were full and conjugate. No facial weakness was noted. Strength of upper and lower extremities was normal throughout, including deltoids and hip flexors bilaterally.  Medications: I have reviewed the patient's current medications.  Assessment/Plan: 81 year old lady with myasthenia gravis who presented with progressive weakness and crisis requiring intubation and plasma exchange after failing treatment with IVIG. She has been subsequently extubated and is on BiPAP. Strength is improved in the upper and lower extremities with increase and Mestinon from 30 mg every 12 hours to every 6 hours.  I will increase Mestinon to 45 mg every 6 hours. We will continue to follow closely.  C.R. Roseanne RenoStewart, MD [facility] [phone]  03/29/2015  8:37 AM

## 2015-03-29 NOTE — Progress Notes (Signed)
[facility] TEAM 1 - Stepdown/ICU TEAM PROGRESS NOTE  Cheyenne Boone:[id number] DOB: 17-Jun-1923 DOA: 03/15/2015 PCP: Barbette Reichmann, MD  Admit HPI / Brief Narrative: 81 year old female with 10 day history of dysphagia. Was admitted to [facility] for what was suspected to be myasthenia gravis flare. She was started on on IVIG and mestinon, however eventually required intubation. Transferred to [facility] at family request for possible PLEX.  Significant Events: 9/27 - Admit for dysphagia  9/29 - Transfer to ICU @ [facility] for respiratory failure & intubated>>Bronchoscopy with bilateral secretions & plugging 10/01 - Extubated 10/03 - Reintubated 10/03 - Transfer to [facility] from [facility] 10/4 - HD cath placed 10/5 PLEX planned, EGD for OGT placement. PLEX stopped due to hypotension 10/7 - concern for allergic rxn to UNasyn or plex citrate, improved with treatment 10/8 - delirium severe--> precedex-->PEA short arrest--> woke up 10/13 - Extubated  HPI/Subjective: The patient tolerated an approximate 4 hour period off BiPAP today which is an improvement.  Unfortunately this trial was ended when she became tachypneic and tachycardic requiring a return to BiPAP.  She denies cp, nv, or abdom pain.   Assessment/Plan:  Acute respiratory failure in setting myasthenia gravis flare BiPAP is being used on an essentially continual basis with attempts each day to liberate patient from BiPAP for as long as possible - it is our hope that Mestinon will improve her strength and gradually allow her liberation from BiPAP - recognize pt is NCB/DNI, and if she does not continue to show evidence of gradual improvement we will then need to consider comfort measures and discontinuation of BiPAP - plan to stop BiPAP to allow meals  Myasthenia gravis crisis Neurology following - completed 4 rounds of PLEX - mestinon dose increased again today - not felt to be safe for return to PLEX in absence of secured airway given hx  of tongue swelling w/ citrate  Allergy to citrate w/ tongue swelling 5th PLEX tx cancelled as pt no longer intubated   Agitation Appears to have resolved  Mucus plugging S/P bronch & BAL 9/29  Aspiration PNA - H parahaemolytics on BAL Has completed a course of abx tx   Acute Renal Failure  Resolved  Normocytic Anemia No evidence of acute blood loss - hemoglobin waxing and waning - follow trend  Large hiatal hernia  Dysphagia Cleared for D1 nectar diet per SLP   Hyperglycemia CBG well controlled   Hypokalemia Supplement as needed to keep at ~4.0  Hypomag Magnesium at goal  Severe malnutrition in context of chronic illness  Code Status: NO CODE - DNR Family Communication: no family present at time of exam Disposition Plan: SDU   Consultants: PCCM  Neuro   Antibiotics: Unasyn 10/04>10/7 Zosyn 9/29>10/04 Vancomycin 9/29 >10/04 Meropenem 10/7>10/12  DVT prophylaxis: lovenox  Objective: Blood pressure 147/85, pulse 123, temperature 97.4 F (36.3 C), temperature source Axillary, resp. rate 35, height  (1.422 m), weight 55.656 kg (122 lb 11.2 oz), SpO2 97 %.  Intake/Output Summary (Last 24 hours) at 03/29/15 1402 Last data filed at 03/29/15 1300  Gross per 24 hour  Intake   1300 ml  Output    875 ml  Net    425 ml   Exam: General: Appears comfortable on BiPAP Lungs: Clear to auscultation bilaterally without wheezes or crackles - good air movement throughout with use of BiPAP Cardiovascular: Regular rate and rhythm without murmur  Abdomen: Nontender, nondistended, soft, bowel sounds positive, no rebound, no ascites, no appreciable  mass Extremities: No significant cyanosis, clubbing, edema bilateral lower extremities  Data Reviewed: Basic Metabolic Panel:  Recent Labs Lab 03/24/15 0500 03/25/15 0237 03/25/15 0515 03/26/15 0500 03/27/15 0530 03/28/15 0620  NA 140 139 136 134* 134* 136  K 3.4* 3.4* 3.3* 3.8 3.5 3.8  CL 99* 96* 102 97* 95*  99*  CO2 32  --  29 28 29 29   GLUCOSE 139* 116* 125* 113* 122* 108*  BUN 23* 27* 22* 21* 21* 22*  CREATININE 0.85 0.80 0.73 0.76 0.84 0.92  CALCIUM 8.2*  --  8.0* 8.6* 9.0 8.5*  MG 1.8  --   --   --   --  2.0    CBC:  Recent Labs Lab 03/25/15 0515 03/26/15 0500 03/27/15 0530 03/27/15 2338 03/28/15 0620  WBC 10.7* 13.6* 16.4* 9.7 10.8*  HGB 7.4* 8.9* 10.1* 8.0* 8.1*  HCT 22.9* 27.4* 31.2* 24.6* 25.1*  MCV 94.6 95.5 94.3 95.3 95.1  PLT 194 278 362 316 313    Liver Function Tests:  Recent Labs Lab 03/28/15 0620  AST 16  ALT 13*  ALKPHOS 68  BILITOT 2.1*  PROT 4.9*  ALBUMIN 2.9*    CBG:  Recent Labs Lab 03/28/15 1956 03/28/15 2304 03/29/15 0357 03/29/15 0800 03/29/15 1216  GLUCAP 103* 109* 99 104* 91    Recent Results (from the past 240 hour(s))  Culture, respiratory (NON-Expectorated)     Status: None   Collection Time: 03/22/15 12:56 PM  Result Value Ref Range Status   Specimen Description TRACHEAL ASPIRATE  Final   Special Requests Normal  Final   Gram Stain   Final    FEW WBC PRESENT,BOTH PMN AND MONONUCLEAR NO SQUAMOUS EPITHELIAL CELLS SEEN NO ORGANISMS SEEN Performed at [facility]    Culture   Final    FEW YEAST CONSISTENT WITH CANDIDA SPECIES Performed at [facility]    Report Status 03/25/2015 FINAL  Final     Studies:   Recent x-ray studies have been reviewed in detail by the Attending Physician  Scheduled Meds:  Scheduled Meds: . ALPRAZolam  0.25 mg Oral BID  . antiseptic oral rinse  7 mL Mouth Rinse q12n4p  . chlorhexidine  15 mL Mouth Rinse BID  . enoxaparin (LOVENOX) injection  40 mg Subcutaneous Q24H  . hydroxypropyl methylcellulose / hypromellose  1 drop Both Eyes TID  . latanoprost  1 drop Right Eye QHS  . LORazepam      . nystatin  5 mL Oral QID  . pyridostigmine  45.6 mg Oral 4 times per day    Time spent on care of this patient: 35 mins   Natash Berman T , MD   [facility] Office   [phone] Pager - Text Page per Loretha StaplerAmion as per below:  On-Call/Text Page:      [email or web address]      password [email or web address]  If 7PM-7AM, please contact night-coverage [email or web address] Password [email or web address] 03/29/2015, 2:02 PM   LOS: 14 days

## 2015-03-29 NOTE — Progress Notes (Signed)
PT Cancellation Note  Patient Details Name: Cheyenne Boone MRN: [id number] DOB: 05/13/1924   Cancelled Treatment:    Reason Eval/Treat Not Completed: Patient not medically ready.  Pt returned to BiPap as she was unable to tolerate venturi mask.  PT will return to work w/ pt again when medically appropriate.  Michail JewelsAshley Parr PT, DPT [phone] Pager: [phone] 03/29/2015, 2:24 PM

## 2015-03-29 NOTE — Progress Notes (Signed)
RT took patient off bi-pap and placed her on a 50% venturi mask. Patient has an O2 sat of 98 currently. RT will continue to monitor patient.

## 2015-03-29 NOTE — Progress Notes (Signed)
PT Cancellation Note  Patient Details Name: Cheyenne GoldKatherine M Boone MRN: [id number] DOB: 06/23/1923   Cancelled Treatment:    Reason Eval/Treat Not Completed: Patient not medically ready.  Pt for trial off BiPap today.  PT will attempt to see pt at a more appropriate time, time permitting.  Michail JewelsAshley Parr PT, DPT [phone] Pager: [phone] 03/29/2015, 9:35 AM

## 2015-03-29 NOTE — Care Management Important Message (Signed)
Important Message  Patient Details  Name: Heath GoldKatherine M Pica MRN: [id number] Date of Birth: 08/17/1923   Medicare Important Message Given:  Yes-second notification given    Kyla BalzarineShealy, Kodee Drury Abena 03/29/2015, 11:56 AM

## 2015-03-29 NOTE — Progress Notes (Signed)
RT was called by the RN to check on patient because she appeared to be "uncomfortable". When RT arrived patient was taking some shallow breaths and grabbing at her chest. RN notified MD of the change and RT placed patient back on bi-pap. RT will continue to monitor.

## 2015-03-30 MED ORDER — IPRATROPIUM-ALBUTEROL 0.5-2.5 (3) MG/3ML IN SOLN: 3.0000 mL | Freq: Four times a day (QID) | RESPIRATORY_TRACT | Status: DC

## 2015-03-30 MED ORDER — IPRATROPIUM-ALBUTEROL 0.5-2.5 (3) MG/3ML IN SOLN
3.0000 mL | Freq: Four times a day (QID) | RESPIRATORY_TRACT | Status: DC
  Administered 2015-03-30: 3 mL via RESPIRATORY_TRACT
  Filled 2015-03-30: qty 3

## 2015-03-30 MED ORDER — ENSURE ENLIVE PO LIQD
237.0000 mL | Freq: Two times a day (BID) | ORAL | Status: DC
  Administered 2015-03-30 – 2015-04-07 (×6): 237 mL via ORAL

## 2015-03-30 MED ORDER — POLYETHYLENE GLYCOL 3350 17 G PO PACK
17.0000 g | PACK | Freq: Every day | ORAL | Status: DC
Start: 2015-03-30 — End: 2015-04-07
  Administered 2015-03-30 – 2015-04-07 (×3): 17 g via ORAL
  Filled 2015-03-30 (×9): qty 1

## 2015-03-30 MED ORDER — IPRATROPIUM-ALBUTEROL 0.5-2.5 (3) MG/3ML IN SOLN
3.0000 mL | Freq: Four times a day (QID) | RESPIRATORY_TRACT | Status: DC
  Administered 2015-03-30 – 2015-04-04 (×18): 3 mL via RESPIRATORY_TRACT
  Filled 2015-03-30 (×18): qty 3

## 2015-03-30 NOTE — Progress Notes (Signed)
Speech Language Pathology Treatment: Dysphagia  Patient Details Name: Cheyenne Boone MRN: [id number] DOB: 12/19/1923 Today's Date: 03/30/2015 Time: [id number] SLP Time Calculation (min) (ACUTE ONLY): 15 min  Assessment / Plan / Recommendation Clinical Impression  Pt seen with am meal, placed on Mazon to facilitate feeding, sustained O2 saturations in mid to high 90s for 10 minutes. SLP provided total assist feeding to minimize fatigue. Min verbal cues needed to reduce ships to single size given cough following consecutive sips. No further s/s of aspiration observed. Educated daughter to technique and results of MBS. Continue current diet and precautions. SLP will follow for tolerance and upgrade as respiratory function improves.    HPI Other Pertinent Information: 81 year old female with 10 day history of dysphagia. [facility] for what was suspected to be myasthenia gravis flare. She was started on on IVIG and mestinon, however eventually required intubation (9/29-10/01, reintubated 03-14-09/13). Pt had barium swallow 9/27 which did not show aspiration of small sips of thin liquids, although pt did not consume larger quantities (making study limited).    Pertinent Vitals    SLP Plan  Continue with current plan of care    Recommendations Diet recommendations: Dysphagia 1 (puree);Nectar-thick liquid Liquids provided via: Cup Medication Administration: Crushed with puree Supervision: Full supervision/cueing for compensatory strategies;Trained caregiver to feed patient;Staff to assist with self feeding Compensations: Minimize environmental distractions;Slow rate;Small sips/bites Postural Changes and/or Swallow Maneuvers: Seated upright 90 degrees              Plan: Continue with current plan of care    GO    [facility] CenterBonnie Kianna Billet, MA CCC-SLP [phone]  [name], Pearl Berlinger Caroline 03/30/2015, 11:02 AM

## 2015-03-30 NOTE — Progress Notes (Signed)
UR COMPLETED  

## 2015-03-30 NOTE — Progress Notes (Signed)
Pulmonary mechanics.   NIF -28 VC .68ml Pt is stable at this time on a venti mask o2 saturations presents at 97%. No distress noted.

## 2015-03-30 NOTE — Progress Notes (Signed)
PT Cancellation Note  Patient Details Name: Cheyenne Boone MRN: [id number] DOB: 06/26/1923   Cancelled Treatment:    Reason Eval/Treat Not Completed: Medical issues which prohibited therapy.  Per RN and MD d/c therapy for now as pt is on and off BiPap.  Please place new order if PT becomes appropriate.  PT is signing off.  Thank you.  Michail JewelsAshley Parr PT, DPT [phone] Pager: [phone] 03/30/2015, 12:32 PM

## 2015-03-30 NOTE — Care Management Note (Addendum)
Case Management Note  Patient Details  Name: Cheyenne Boone MRN: [id number] Date of Birth: 04/26/1924  Subjective/Objective:       Pt with 10 day history of dysphagia. Was admitted [facility] for what was suspected to be myasthenia gravis flare. She was started on on IVIG and mestinon, however eventually required intubation. Transferred to [facility] at family request and for possible PLEX.     Action/Plan: SNF. CM to f/u with disposition needs.  Expected Discharge Date:                  Expected Discharge Plan:  In-House Referral:   CSW  Discharge planning Services  CM Consult  Post Acute Care Choice:    Choice offered to:     DME Arranged:    DME Agency:     HH Arranged:    HH Agency:     Status of Service:  In process, will continue to follow   Additional Comments:  Daughter Cheyenne Drone([name]) lives next door to mom. Daughter states plan is for mom to go to SNF @ d/c short term. States would like for mom to to be [facility]. CM made CSW aware.   Cheyenne Boone (Daughter) [phone], Cheyenne Boone (Daughter)  [phone]    Gae GallopCole, Desta Bujak [place], [place]RN,BSN,CM [phone] 03/30/2015, 10:47 AM

## 2015-03-30 NOTE — Clinical Social Work Note (Signed)
Clinical Social Work Assessment  Patient Details  Name: Cheyenne Boone MRN: [id number] Date of Birth: May 23, 1924  Date of referral:  03/30/15               Reason for consult:  Facility Placement                Permission sought to share information with:  Family Supports Permission granted to share information::  Yes, Verbal Permission Granted  Name::     Lee,[name]   Relationship::  daughter   Contact Information:     Housing/Transportation Living arrangements for the past 2 months:  Single Family Home Source of Information:  Adult Children Patient Interpreter Needed:  None Criminal Activity/Legal Involvement Pertinent to Current Situation/Hospitalization:  No - Comment as needed Significant Relationships:  Adult Children Lives with:  Self Do you feel safe going back to the place where you live?  No Need for family participation in patient care:  Yes (Comment)  Care giving concerns: None    Social Worker assessment / plan: CSW met with the pt and the pt's daughter [name] at the bedside. CSW introduced self and purpose of the visit. CSW discussed SNF rehab. CSW explained insurance and its relation to SNF placement.  CSW provided [name] with a SNF list. [name] reported that she will talk to her sister, but prefers the pt to go to [facility] CSW answered all social questions in which Tatum inquired about. CSW will continue to follow this pt and assist with discharge as needed.   Employment status:  Retired Forensic scientist:  [employer] ) PT Recommendations:  [place] / Referral to community resources:  [place]  Patient/Family's Response to care:  [name] reported that the care in which the pt has received has been great.   Patient/Family's Understanding of and Emotional Response to Diagnosis, Current Treatment, and Prognosis:  [name] provided the CSW with a detail account of the pt's medical  history. [name] reported being confident that the pt should receive rehab prior to going home.   Emotional Assessment Appearance:  Appears stated age Attitude/Demeanor/Rapport:  Unable to Assess Affect (typically observed):  Calm Orientation:  Oriented to Self, Oriented to Place Alcohol / Substance use:  Not Applicable Psych involvement (Current and /or in the community):  No (Comment)  Discharge Needs  Concerns to be addressed:  Denies Needs/Concerns at this time Readmission within the last 30 days:  No Current discharge risk:  None Barriers to Discharge:  No Barriers Identified   Kamyrah Feeser, LCSW 03/30/2015, 12:21 PM

## 2015-03-30 NOTE — Progress Notes (Signed)
[facility] TEAM 1 - Stepdown/ICU TEAM Progress Note  Cheyenne Boone NWG:[id number] DOB: 1923/07/19 DOA: 03/15/2015 PCP: Barbette Reichmann, MD  Admit HPI / Brief Narrative: 81 year old WF PMHx 10 day history of dysphagia. Was admitted to [facility] for what was suspected to be myasthenia gravis flare. She was started on on IVIG and mestinon, however eventually required intubation. Transferred to [facility] at family request for possible Plasma Exchange (PLEX).  HPI/Subjective: 10/18 A/O 4, negative SOB, negative CP, negative dysphagia, negative extremity pain. States was not on home O2 prior to admission. States did not have diagnosis of myasthenia gravis until this admission   Assessment/Plan: Acute respiratory failure in setting myasthenia gravis flare BiPAP is being used on an essentially continual basis with attempts each day to liberate patient from BiPAP for as long as possible - it is our hope that Mestinon will improve her strength and gradually allow her liberation from BiPAP - recognize pt is NCB/DNI, and if she does not continue to show evidence of gradual improvement we will then need to consider comfort measures and discontinuation of BiPAP - plan to stop BiPAP to allow meals -Obtain vital capacity (VC) q shift -Obtain negative inspiratory force (NIF) q shift  Myasthenia gravis crisis Neurology following - completed 4 rounds of PLEX - mestinon dose increased again today - not felt to be safe for return to PLEX in absence of secured airway given hx of tongue swelling w/ citrate  Allergy to citrate w/ tongue swelling 5th PLEX tx cancelled as pt no longer intubated   Agitation Appears to have resolved  Mucus plugging S/P bronch & BAL 9/29  Aspiration PNA - H parahaemolytics on BAL -Has completed a course of abx tx  -Flutter valve -Patient with some wheezing unfortunately cannot start steroids secondary interaction with the Pyridostigmine -Physiotherapy vest BID -DuoNeb  QID  Acute Renal Failure  Resolved  Normocytic Anemia No evidence of acute blood loss - hemoglobin waxing and waning - follow trend  Large hiatal hernia  Dysphagia -Cleared for D1 nectar diet per SLP   Hyperglycemia CBG well controlled   Hypokalemia Supplement as needed to keep at ~4.0  Hypomag Magnesium at goal  Severe malnutrition in context of chronic illness   Code Status: FULL Family Communication: family present at time of exam Disposition Plan: Per neurology   Consultants: PCCM  Neuro   Procedure/Significant Events: 9/27 - Admit for dysphagia  9/29 - Transfer to ICU @ [facility] for respiratory failure & intubated>>Bronchoscopy with bilateral secretions & plugging 10/01 - Extubated 10/03 - Reintubated 10/03 - Transfer to [facility] from [facility] 10/4 - HD cath placed 10/5 PLEX planned, EGD for OGT placement. PLEX stopped due to hypotension 10/7 - concern for allergic rxn to [facility] or plex citrate, improved with treatment 10/8 - delirium severe--> precedex-->PEA short arrest--> woke up 10/13 - Extubated    Culture   Antibiotics: Unasyn 10/04>10/7 Zosyn 9/29>10/04 Vancomycin 9/29 >10/04 Meropenem 10/7>10/12  DVT prophylaxis: lovenox   Devices    LINES / TUBES:      Continuous Infusions: . sodium chloride 50 mL/hr at 03/30/15 0843    Objective: VITAL SIGNS: Temp: 97.8 F (36.6 C) (10/18 1924) Temp Source: Axillary (10/18 1924) BP: 161/71 mmHg (10/18 1900) Pulse Rate: 89 (10/18 1900) SPO2; FIO2:   Intake/Output Summary (Last 24 hours) at 03/30/15 2018 Last data filed at 03/30/15 1900  Gross per 24 hour  Intake   1215 ml  Output    255 ml  Net  960 ml     Exam: General: A/O 4, NAD, positive acute respiratory distress (requiring O2 support/BiPAP) Eyes: Negative headache, eye pain, double vision,negative scleral hemorrhage ENT: Negative Runny nose, negative ear pain, negative gingival bleeding, Neck:  Negative scars,  masses, torticollis, lymphadenopathy, JVD Lungs: diffuse rhonchi, mild expiratory wheezing, negative crackles Cardiovascular: Regular rate and rhythm without murmur gallop or rub normal S1 and S2 Abdomen:negative abdominal pain, nondistended, positive soft, bowel sounds, no rebound, no ascites, no appreciable mass Extremities: No significant cyanosis, clubbing, or edema bilateral lower extremities Psychiatric:  Negative depression, negative anxiety, negative fatigue, negative mania  Neurologic:  Cranial nerves II through XII intact, tongue/uvula midline, all extremities muscle strength 5/5, sensation intact throughout, negative dysarthria, negative expressive aphasia, negative receptive aphasia.   Data Reviewed: Basic Metabolic Panel:  Recent Labs Lab 03/24/15 0500 03/25/15 0237 03/25/15 0515 03/26/15 0500 03/27/15 0530 03/28/15 0620  NA 140 139 136 134* 134* 136  K 3.4* 3.4* 3.3* 3.8 3.5 3.8  CL 99* 96* 102 97* 95* 99*  CO2 32  --  29 28 29 29   GLUCOSE 139* 116* 125* 113* 122* 108*  BUN 23* 27* 22* 21* 21* 22*  CREATININE 0.85 0.80 0.73 0.76 0.84 0.92  CALCIUM 8.2*  --  8.0* 8.6* 9.0 8.5*  MG 1.8  --   --   --   --  2.0   Liver Function Tests:  Recent Labs Lab 03/28/15 0620  AST 16  ALT 13*  ALKPHOS 68  BILITOT 2.1*  PROT 4.9*  ALBUMIN 2.9*   No results for input(s): LIPASE, AMYLASE in the last 168 hours. No results for input(s): AMMONIA in the last 168 hours. CBC:  Recent Labs Lab 03/25/15 0515 03/26/15 0500 03/27/15 0530 03/27/15 2338 03/28/15 0620  WBC 10.7* 13.6* 16.4* 9.7 10.8*  HGB 7.4* 8.9* 10.1* 8.0* 8.1*  HCT 22.9* 27.4* 31.2* 24.6* 25.1*  MCV 94.6 95.5 94.3 95.3 95.1  PLT 194 278 362 316 313   Cardiac Enzymes: No results for input(s): CKTOTAL, CKMB, CKMBINDEX, TROPONINI in the last 168 hours. BNP (last 3 results)  Recent Labs  03/15/15 2246  BNP 314.2*    ProBNP (last 3 results) No results for input(s): PROBNP in the last 8760  hours.  CBG:  Recent Labs Lab 03/28/15 1956 03/28/15 2304 03/29/15 0357 03/29/15 0800 03/29/15 1216  GLUCAP 103* 109* 99 104* 91    Recent Results (from the past 240 hour(s))  Culture, respiratory (NON-Expectorated)     Status: None   Collection Time: 03/22/15 12:56 PM  Result Value Ref Range Status   Specimen Description TRACHEAL ASPIRATE  Final   Special Requests Normal  Final   Gram Stain   Final    FEW WBC PRESENT,BOTH PMN AND MONONUCLEAR NO SQUAMOUS EPITHELIAL CELLS SEEN NO ORGANISMS SEEN Performed at [facility]    Culture   Final    FEW YEAST CONSISTENT WITH CANDIDA SPECIES Performed at [facility]    Report Status 03/25/2015 FINAL  Final     Studies:  Recent x-ray studies have been reviewed in detail by the Attending Physician  Scheduled Meds:  Scheduled Meds: . antiseptic oral rinse  7 mL Mouth Rinse q12n4p  . chlorhexidine  15 mL Mouth Rinse BID  . enoxaparin (LOVENOX) injection  40 mg Subcutaneous Q24H  . feeding supplement (ENSURE ENLIVE)  237 mL Oral BID BM  . hydroxypropyl methylcellulose / hypromellose  1 drop Both Eyes TID  . ipratropium-albuterol  3 mL  Nebulization QID  . latanoprost  1 drop Right Eye QHS  . polyethylene glycol  17 g Oral Daily  . pyridostigmine  45.6 mg Oral 4 times per day    Time spent on care of this patient: 40 mins   Hennesy Sobalvarro, Roselind Messier , MD  [facility] Office  [phone] Pager - [phone]  On-Call/Text Page:      [email or web address]      password [email or web address]  If 7PM-7AM, please contact night-coverage [email or web address] Password [email or web address] 03/30/2015, 8:17 PM   LOS: 15 days   Care during the described time interval was provided by me .  I have reviewed this patient's available data, including medical history, events of note, physical examination, and all test results as part of my evaluation. I have personally reviewed and interpreted all radiology studies.   Carolyne Littles, MD [phone] Pager

## 2015-03-30 NOTE — Progress Notes (Signed)
Pt tolerated NIV mask well throughout the night. Pt is now on a 50% venti mask. RN is aware. Pt is stable at this time, no distress noted.

## 2015-03-30 NOTE — Clinical Social Work Placement (Signed)
   CLINICAL SOCIAL WORK PLACEMENT  NOTE  Date:  03/30/2015  Patient Details  Name: Cheyenne GoldKatherine M Culverhouse MRN: [id number] Date of Birth: 05/01/1924  Clinical Social Work is seeking post-discharge placement for this patient at the Skilled  Nursing Facility level of care (*CSW will initial, date and re-position this form in  chart as items are completed):  Yes   Patient/family provided with [facility] Clinical Social Work Department's list of facilities offering this level of care within the geographic area requested by the patient (or if unable, by the patient's family).  Yes   Patient/family informed of their freedom to choose among providers that offer the needed level of care, that participate in Medicare, Medicaid or managed care program needed by the patient, have an available bed and are willing to accept the patient.  Yes   Patient/family informed of [facility]'s ownership interest in [facility] and [facility], as well as of the fact that they are under no obligation to receive care at these facilities.  PASRR submitted to EDS on       PASRR number received on       Existing PASRR number confirmed on 03/30/15     FL2 transmitted to all facilities in geographic area requested by pt/family on 03/30/15     FL2 transmitted to all facilities within larger geographic area on       Patient informed that his/her managed care company has contracts with or will negotiate with certain facilities, including the following:            Patient/family informed of bed offers received.  Patient chooses bed at       Physician recommends and patient chooses bed at      Patient to be transferred to   on  .  Patient to be transferred to facility by       Patient family notified on   of transfer.  Name of family member notified:        PHYSICIAN       Additional Comment:    _______________________________________________ Gwynne EdingerBibbs, Darcelle Herrada, LCSW 03/30/2015, 12:25 PM

## 2015-03-30 NOTE — Progress Notes (Signed)
Subjective: Patient continues to improve. She has been off BiPAP all night and is in no acute distress. Mestinon was increased to 45 mg every 6 hours yesterday and she has not shown any side effects.  Objective: Current vital signs: BP 180/90 mmHg  Pulse 91  Temp(Src) 97.2 F (36.2 C) (Oral)  Resp 23  Ht 4\' 8"  (1.422 m)  Wt 57.063 kg (125 lb 12.8 oz)  BMI 28.22 kg/m2  SpO2 100%  Neurologic Exam: Patient was alert and in no acute distress. She was slightly tachypneic. Mental status was normal. Extraocular movements were full and conjugate with no diplopia in any field of gaze. No facial weakness noted. Strength of extremities was normal throughout including deltoids and hip flexors bilaterally (which were weak 2 days ago). Deep tendon reflexes were 1+ and symmetrical.  Medications: I have reviewed the patient's current medications.  Assessment/Plan: 81 year old lady with myasthenia gravis with crisis requiring intubation and treatment with plasma exchange, continuing to improve with increases in Mestinon over the past 2 days, including improved respiratory functioning. Strength of extremities is normal throughout at this point.  I'm recommending no changes in current management. If she continues to do well I will switch her to a long-acting formulation of Mestinon.  We will continue to follow this patient closely with you.  C.R. Roseanne RenoStewart, MD [facility] [phone]  03/30/2015  8:37 AM

## 2015-03-31 LAB — CBC WITH DIFFERENTIAL/PLATELET
BASOS ABS: 0 10*3/uL (ref 0.0–0.1)
Basophils Relative: 0 %
EOS PCT: 3 %
Eosinophils Absolute: 0.2 10*3/uL (ref 0.0–0.7)
HEMATOCRIT: 26.2 % — AB (ref 36.0–46.0)
HEMOGLOBIN: 8.4 g/dL — AB (ref 12.0–15.0)
LYMPHS ABS: 0.6 10*3/uL — AB (ref 0.7–4.0)
LYMPHS PCT: 8 %
MCH: 31.1 pg (ref 26.0–34.0)
MCHC: 32.1 g/dL (ref 30.0–36.0)
MCV: 97 fL (ref 78.0–100.0)
Monocytes Absolute: 0.8 10*3/uL (ref 0.1–1.0)
Monocytes Relative: 10 %
NEUTROS ABS: 5.9 10*3/uL (ref 1.7–7.7)
NEUTROS PCT: 79 %
Platelets: 402 10*3/uL — ABNORMAL HIGH (ref 150–400)
RBC: 2.7 MIL/uL — AB (ref 3.87–5.11)
RDW: 16.5 % — ABNORMAL HIGH (ref 11.5–15.5)
WBC: 7.4 10*3/uL (ref 4.0–10.5)

## 2015-03-31 LAB — COMPREHENSIVE METABOLIC PANEL
ALBUMIN: 2.8 g/dL — AB (ref 3.5–5.0)
ALT: 14 U/L (ref 14–54)
ANION GAP: 9 (ref 5–15)
AST: 16 U/L (ref 15–41)
Alkaline Phosphatase: 109 U/L (ref 38–126)
BUN: 22 mg/dL — AB (ref 6–20)
CHLORIDE: 107 mmol/L (ref 101–111)
CO2: 27 mmol/L (ref 22–32)
Calcium: 9 mg/dL (ref 8.9–10.3)
Creatinine, Ser: 0.77 mg/dL (ref 0.44–1.00)
GFR calc Af Amer: >60 mL/min (ref >60)
Glucose, Bld: 120 mg/dL — ABNORMAL HIGH (ref 65–99)
POTASSIUM: 3.2 mmol/L — AB (ref 3.5–5.1)
Sodium: 143 mmol/L (ref 135–145)
Total Bilirubin: 0.9 mg/dL (ref 0.3–1.2)
Total Protein: 5.5 g/dL — ABNORMAL LOW (ref 6.5–8.1)

## 2015-03-31 LAB — MAGNESIUM: Magnesium: 2 mg/dL (ref 1.7–2.4)

## 2015-03-31 MED ORDER — LORAZEPAM 2 MG/ML IJ SOLN
0.5000 mg | Freq: Four times a day (QID) | INTRAMUSCULAR | Status: DC | PRN
  Administered 2015-04-01: 0.5 mg via INTRAVENOUS
  Filled 2015-03-31 (×2): qty 1

## 2015-03-31 MED ORDER — POTASSIUM CHLORIDE CRYS ER 20 MEQ PO TBCR
40.0000 meq | EXTENDED_RELEASE_TABLET | Freq: Two times a day (BID) | ORAL | Status: DC
  Administered 2015-03-31 (×2): 40 meq via ORAL
  Filled 2015-03-31 (×3): qty 2

## 2015-03-31 NOTE — Progress Notes (Signed)
[place] TEAM 1 - Stepdown/ICU TEAM PROGRESS NOTE  TERRIANN DIFONZO TFT:[id number] DOB: 11-23-1923 DOA: 03/15/2015 PCP: Barbette Reichmann, MD  Admit HPI / Brief Narrative: 81 year old female with 10 day history of dysphagia. Was admitted to [facility] for what was suspected to be myasthenia gravis flare. She was started on on IVIG and mestinon, however eventually required intubation. Transferred to [facility] at family request for possible PLEX.  Significant Events: 9/27 - Admit for dysphagia  9/29 - Transfer to ICU @ [facility] for respiratory failure & intubated>>Bronchoscopy with bilateral secretions & plugging 10/01 - Extubated 10/03 - Reintubated 10/03 - Transfer to [facility] from [facility] 10/4 - HD cath placed 10/5 PLEX planned, EGD for OGT placement. PLEX stopped due to hypotension 10/7 - concern for allergic rxn to UNasyn or plex citrate, improved with treatment 10/8 - delirium severe--> precedex-->PEA short arrest--> woke up 10/13 - Extubated  HPI/Subjective: The patient remained on Ventimask throughout the majority the night though she did require a short return to BiPAP this morning.  It appears she was able to return to the Ventimask around noon today.  She is making slow but steady progress.  At the time of my exam she has just recently returned to BIPAP.    Assessment/Plan:  Acute respiratory failure in setting myasthenia gravis flare BiPAP is being used on an essentially continual basis with attempts each day to liberate patient from BiPAP for as long as possible - it is our hope that Mestinon will improve her strength and gradually allow her liberation from BiPAP - recognize pt is NCB/DNI, and if she does not continue to show evidence of gradual improvement we will then need to consider comfort measures and discontinuation of BiPAP - plan to stop BiPAP to allow meals - making some progress   Myasthenia gravis crisis Neurology following - completed 4 rounds of PLEX - mestinon continues  - not felt to be safe for return to PLEX in absence of secured airway given hx of tongue swelling w/ citrate  Allergy to citrate w/ tongue swelling 5th PLEX tx cancelled as pt no longer intubated   Agitation Appears to have resolved  Mucus plugging S/P bronch & BAL 9/29  Aspiration PNA - H parahaemolytics on BAL Has completed a course of abx tx   Acute Renal Failure  Resolved  Normocytic Anemia No evidence of acute blood loss - hemoglobin waxing and waning - follow trend  Large hiatal hernia  Dysphagia Cleared for D1 nectar diet per SLP - stop BIPAP at each meal to allow intake   Hyperglycemia CBG remains well controlled   Hypokalemia Supplement as needed to keep at ~4.0  Hypomag Magnesium at goal  Severe malnutrition in context of chronic illness  Code Status: NO CODE - DNR Family Communication: no family present at time of exam Disposition Plan: SDU   Consultants: PCCM  Neuro   Antibiotics: Unasyn 10/04>10/7 Zosyn 9/29>10/04 Vancomycin 9/29 >10/04 Meropenem 10/7>10/12  DVT prophylaxis: lovenox  Objective: Blood pressure 154/87, pulse 94, temperature 98 F (36.7 C), temperature source Oral, resp. rate 29, height  (1.422 m), weight 58.084 kg (128 lb 0.8 oz), SpO2 100 %.  Intake/Output Summary (Last 24 hours) at 03/31/15 1349 Last data filed at 03/31/15 1230  Gross per 24 hour  Intake 680.83 ml  Output      0 ml  Net 680.83 ml   Exam: General: resting on BIPAP  Lungs: Clear to auscultation bilaterally without wheezes or crackles - good air  movement throughout Cardiovascular: Regular rate and rhythm  Abdomen: Nontender, nondistended, soft, bowel sounds positive, no rebound, no ascites, no appreciable mass Extremities: No significant cyanosis, clubbing, or edema bilateral lower extremities  Data Reviewed: Basic Metabolic Panel:  Recent Labs Lab 03/25/15 0515 03/26/15 0500 03/27/15 0530 03/28/15 0620 03/31/15 0500  NA 136 134* 134* 136  143  K 3.3* 3.8 3.5 3.8 3.2*  CL 102 97* 95* 99* 107  CO2 29 28 29 29 27   GLUCOSE 125* 113* 122* 108* 120*  BUN 22* 21* 21* 22* 22*  CREATININE 0.73 0.76 0.84 0.92 0.77  CALCIUM 8.0* 8.6* 9.0 8.5* 9.0  MG  --   --   --  2.0 2.0    CBC:  Recent Labs Lab 03/26/15 0500 03/27/15 0530 03/27/15 2338 03/28/15 0620 03/31/15 0500  WBC 13.6* 16.4* 9.7 10.8* 7.4  NEUTROABS  --   --   --   --  5.9  HGB 8.9* 10.1* 8.0* 8.1* 8.4*  HCT 27.4* 31.2* 24.6* 25.1* 26.2*  MCV 95.5 94.3 95.3 95.1 97.0  PLT 278 362 316 313 402*    Liver Function Tests:  Recent Labs Lab 03/28/15 0620 03/31/15 0500  AST 16 16  ALT 13* 14  ALKPHOS 68 109  BILITOT 2.1* 0.9  PROT 4.9* 5.5*  ALBUMIN 2.9* 2.8*    CBG:  Recent Labs Lab 03/28/15 1956 03/28/15 2304 03/29/15 0357 03/29/15 0800 03/29/15 1216  GLUCAP 103* 109* 99 104* 91    Recent Results (from the past 240 hour(s))  Culture, respiratory (NON-Expectorated)     Status: None   Collection Time: 03/22/15 12:56 PM  Result Value Ref Range Status   Specimen Description TRACHEAL ASPIRATE  Final   Special Requests Normal  Final   Gram Stain   Final    FEW WBC PRESENT,BOTH PMN AND MONONUCLEAR NO SQUAMOUS EPITHELIAL CELLS SEEN NO ORGANISMS SEEN Performed at [facility]    Culture   Final    FEW YEAST CONSISTENT WITH CANDIDA SPECIES Performed at [facility]    Report Status 03/25/2015 FINAL  Final     Studies:   Recent x-ray studies have been reviewed in detail by the Attending Physician  Scheduled Meds:  Scheduled Meds: . antiseptic oral rinse  7 mL Mouth Rinse q12n4p  . chlorhexidine  15 mL Mouth Rinse BID  . enoxaparin (LOVENOX) injection  40 mg Subcutaneous Q24H  . feeding supplement (ENSURE ENLIVE)  237 mL Oral BID BM  . hydroxypropyl methylcellulose / hypromellose  1 drop Both Eyes TID  . ipratropium-albuterol  3 mL Nebulization QID  . latanoprost  1 drop Right Eye QHS  . polyethylene glycol  17 g Oral  Daily  . pyridostigmine  45.6 mg Oral 4 times per day    Time spent on care of this patient: 25 mins   [facility]CCLUNG,Yarelin Reichardt T , MD   [facility] Office  [phone] Pager - Text Page per Loretha StaplerAmion as per below:  On-Call/Text Page:      [email or web address]      password [email or web address]  If 7PM-7AM, please contact night-coverage [email or web address] Password [email or web address] 03/31/2015, 1:49 PM   LOS: 16 days

## 2015-03-31 NOTE — Progress Notes (Signed)
Subjective: Patient has no complaints.  Nurse this morning felt she was pulling hard to breath and placed her back on BiPAP  Objective: Current vital signs: BP 160/73 mmHg  Pulse 95  Temp(Src) 97.8 F (36.6 C) (Axillary)  Resp 25  Ht 4\' 8"  (1.422 m)  Wt 58.084 kg (128 lb 0.8 oz)  BMI 28.72 kg/m2  SpO2 99% Vital signs in last 24 hours: Temp:  [97.3 F (36.3 C)-97.8 F (36.6 C)] 97.8 F (36.6 C) (10/19 0824) Pulse Rate:  [86-109] 95 (10/19 0819) Resp:  [20-34] 25 (10/19 0819) BP: (97-171)/(44-91) 160/73 mmHg (10/19 0819) SpO2:  [98 %-100 %] 99 % (10/19 0819) FiO2 (%):  [45 %-55 %] 50 % (10/19 0808) Weight:  [58.084 kg (128 lb 0.8 oz)] 58.084 kg (128 lb 0.8 oz) (10/19 0500)  Intake/Output from previous day: 10/18 0701 - 10/19 0700 In: 875.8 [P.O.:60; I.V.:810.8] Out: 75 [Urine:75] Intake/Output this shift:   Nutritional status: DIET - DYS 1 Room service appropriate?: Yes; Fluid consistency:: Nectar Thick  Neurologic Exam: General: NAD Mental Status: Patient in no distress. When taken off BiPAP she does show difficulty breathing.  Sating well. Mental status normal.  Cranial Nerves: II:  Visual fields grossly normal, pupils equal, round, reactive to light and accommodation III,IV, VI: ptosis not present, extra-ocular motions intact bilaterally V,VII: smile symmetric, facial light touch sensation normal bilaterally VIII: hearing normal bilaterally IX,X: uvula rises symmetrically XI: bilateral shoulder shrug XII: midline tongue extension without atrophy or fasciculations  Motor: Moving all extremities antigravity Sensory: Pinprick and light touch intact throughout, bilaterally Deep Tendon Reflexes:  1+ throughout   Lab Results: Basic Metabolic Panel:  Recent Labs Lab 03/25/15 0515 03/26/15 0500 03/27/15 0530 03/28/15 0620 03/31/15 0500  NA 136 134* 134* 136 143  K 3.3* 3.8 3.5 3.8 3.2*  CL 102 97* 95* 99* 107  CO2 29 28 29 29 27   GLUCOSE 125* 113* 122*  108* 120*  BUN 22* 21* 21* 22* 22*  CREATININE 0.73 0.76 0.84 0.92 0.77  CALCIUM 8.0* 8.6* 9.0 8.5* 9.0  MG  --   --   --  2.0 2.0    Liver Function Tests:  Recent Labs Lab 03/28/15 0620 03/31/15 0500  AST 16 16  ALT 13* 14  ALKPHOS 68 109  BILITOT 2.1* 0.9  PROT 4.9* 5.5*  ALBUMIN 2.9* 2.8*   No results for input(s): LIPASE, AMYLASE in the last 168 hours. No results for input(s): AMMONIA in the last 168 hours.  CBC:  Recent Labs Lab 03/26/15 0500 03/27/15 0530 03/27/15 2338 03/28/15 0620 03/31/15 0500  WBC 13.6* 16.4* 9.7 10.8* 7.4  NEUTROABS  --   --   --   --  5.9  HGB 8.9* 10.1* 8.0* 8.1* 8.4*  HCT 27.4* 31.2* 24.6* 25.1* 26.2*  MCV 95.5 94.3 95.3 95.1 97.0  PLT 278 362 316 313 402*    Cardiac Enzymes: No results for input(s): CKTOTAL, CKMB, CKMBINDEX, TROPONINI in the last 168 hours.  Lipid Panel: No results for input(s): CHOL, TRIG, HDL, CHOLHDL, VLDL, LDLCALC in the last 168 hours.  CBG:  Recent Labs Lab 03/28/15 1956 03/28/15 2304 03/29/15 0357 03/29/15 0800 03/29/15 1216  GLUCAP 103* 109* 99 104* 91    Microbiology: Results for orders placed or performed during the hospital encounter of 03/15/15  MRSA PCR Screening     Status: None   Collection Time: 03/15/15 10:56 PM  Result Value Ref Range Status   MRSA by PCR NEGATIVE NEGATIVE Final  Comment:        The GeneXpert MRSA Assay (FDA approved for NASAL specimens only), is one component of a comprehensive MRSA colonization surveillance program. It is not intended to diagnose MRSA infection nor to guide or monitor treatment for MRSA infections.   Culture, respiratory (NON-Expectorated)     Status: None   Collection Time: 03/22/15 12:56 PM  Result Value Ref Range Status   Specimen Description TRACHEAL ASPIRATE  Final   Special Requests Normal  Final   Gram Stain   Final    FEW WBC PRESENT,BOTH PMN AND MONONUCLEAR NO SQUAMOUS EPITHELIAL CELLS SEEN NO ORGANISMS SEEN Performed at  [facility]    Culture   Final    FEW YEAST CONSISTENT WITH CANDIDA SPECIES Performed at [facility]    Report Status 03/25/2015 FINAL  Final    Coagulation Studies: No results for input(s): LABPROT, INR in the last 72 hours.  Imaging: No results found.  Medications:  Scheduled: . antiseptic oral rinse  7 mL Mouth Rinse q12n4p  . chlorhexidine  15 mL Mouth Rinse BID  . enoxaparin (LOVENOX) injection  40 mg Subcutaneous Q24H  . feeding supplement (ENSURE ENLIVE)  237 mL Oral BID BM  . hydroxypropyl methylcellulose / hypromellose  1 drop Both Eyes TID  . ipratropium-albuterol  3 mL Nebulization QID  . latanoprost  1 drop Right Eye QHS  . polyethylene glycol  17 g Oral Daily  . pyridostigmine  45.6 mg Oral 4 times per day    Assessment/Plan:  81 year old lady with myasthenia gravis with crisis requiring intubation and treatment with plasma exchange, continuing to improve with increases in Mestinon over the past 2 days, including improved respiratory functioning. Strength of extremities is normal throughout at this point.--Had received 2 doses of Ativan yesterday which may be playing a role in her slight respiratory decompensation.    Recommend no changes in current management. Minimize potentially sedating drugs.  We will continue to follow this patient closely with you.  Felicie Morn PA-C [facility] [phone]  03/31/2015, 9:32 AM   I personally participated in this patient's evaluation and management, including formulating the above clinical impression and management recommendations.  Venetia Maxon M.D. [facility] [phone]

## 2015-03-31 NOTE — Progress Notes (Signed)
Pt's daughter Olegario MessierKathy called for an update on pt's condition; family states that she would like to be notified via phone from MD regarding pt's condition.

## 2015-03-31 NOTE — Progress Notes (Signed)
NIF=-30 with Vital Capacity=.[phone]

## 2015-03-31 NOTE — Progress Notes (Signed)
Patient working hard to breath upon arrival. NIF -26 and VC 0.5 L. Patient placed on BiPAP due to increased WOB. RN aware. Will continue to monitor.

## 2015-03-31 NOTE — Clinical Social Work Note (Signed)
CSW spoke with pt's daughter Steward DroneBrenda regarding bed offers. Steward DroneBrenda [facility]. CSW will remain available to provided support to the family and pt.    Caria Transue, MSW, LCSWA [phone]

## 2015-04-01 DIAGNOSIS — E873 Alkalosis: Secondary | ICD-10-CM | POA: Diagnosis present

## 2015-04-01 LAB — BLOOD GAS, ARTERIAL
Acid-base deficit: 0.5 mmol/L (ref 0.0–2.0)
BICARBONATE: 22.6 meq/L (ref 20.0–24.0)
DELIVERY SYSTEMS: POSITIVE
DRAWN BY: 313941
Expiratory PAP: 6
FIO2: 0.4
Inspiratory PAP: 14
O2 Saturation: 95.2 %
Patient temperature: 97.3
RATE: 8 resp/min
TCO2: 23.5 mmol/L (ref 0–100)
pCO2 arterial: 29.4 mmHg — ABNORMAL LOW (ref 35.0–45.0)
pH, Arterial: 7.494 — ABNORMAL HIGH (ref 7.350–7.450)
pO2, Arterial: 70.7 mmHg — ABNORMAL LOW (ref 80.0–100.0)

## 2015-04-01 LAB — COMPREHENSIVE METABOLIC PANEL
ALBUMIN: 2.7 g/dL — AB (ref 3.5–5.0)
ALT: 12 U/L — ABNORMAL LOW (ref 14–54)
AST: 15 U/L (ref 15–41)
Alkaline Phosphatase: 108 U/L (ref 38–126)
Anion gap: 5 (ref 5–15)
BILIRUBIN TOTAL: 0.9 mg/dL (ref 0.3–1.2)
BUN: 17 mg/dL (ref 6–20)
CHLORIDE: 113 mmol/L — AB (ref 101–111)
CO2: 27 mmol/L (ref 22–32)
Calcium: 9 mg/dL (ref 8.9–10.3)
Creatinine, Ser: 0.75 mg/dL (ref 0.44–1.00)
GFR calc Af Amer: >60 mL/min (ref >60)
GFR calc non Af Amer: >60 mL/min (ref >60)
GLUCOSE: 108 mg/dL — AB (ref 65–99)
POTASSIUM: 4 mmol/L (ref 3.5–5.1)
Sodium: 145 mmol/L (ref 135–145)
TOTAL PROTEIN: 5.4 g/dL — AB (ref 6.5–8.1)

## 2015-04-01 LAB — CBC WITH DIFFERENTIAL/PLATELET
Basophils Absolute: 0 10*3/uL (ref 0.0–0.1)
Basophils Relative: 0 %
EOS PCT: 6 %
Eosinophils Absolute: 0.4 10*3/uL (ref 0.0–0.7)
HCT: 26.6 % — ABNORMAL LOW (ref 36.0–46.0)
Hemoglobin: 8.2 g/dL — ABNORMAL LOW (ref 12.0–15.0)
LYMPHS ABS: 0.8 10*3/uL (ref 0.7–4.0)
LYMPHS PCT: 12 %
MCH: 30.1 pg (ref 26.0–34.0)
MCHC: 30.8 g/dL (ref 30.0–36.0)
MCV: 97.8 fL (ref 78.0–100.0)
MONO ABS: 0.8 10*3/uL (ref 0.1–1.0)
Monocytes Relative: 11 %
Neutro Abs: 5 10*3/uL (ref 1.7–7.7)
Neutrophils Relative %: 71 %
PLATELETS: 430 10*3/uL — AB (ref 150–400)
RBC: 2.72 MIL/uL — ABNORMAL LOW (ref 3.87–5.11)
RDW: 16.4 % — AB (ref 11.5–15.5)
WBC: 6.9 10*3/uL (ref 4.0–10.5)

## 2015-04-01 LAB — MAGNESIUM: Magnesium: 2.1 mg/dL (ref 1.7–2.4)

## 2015-04-01 MED ORDER — PREDNISONE 10 MG PO TABS
10.0000 mg | ORAL_TABLET | Freq: Every day | ORAL | Status: DC
  Administered 2015-04-01 – 2015-04-07 (×7): 10 mg via ORAL
  Filled 2015-04-01 (×7): qty 1

## 2015-04-01 MED ORDER — PYRIDOSTIGMINE BROMIDE 60 MG/5ML PO SYRP
60.0000 mg | ORAL_SOLUTION | Freq: Four times a day (QID) | ORAL | Status: DC
  Administered 2015-04-01 – 2015-04-07 (×23): 60 mg via ORAL
  Filled 2015-04-01 (×28): qty 5

## 2015-04-01 MED ORDER — LORAZEPAM 2 MG/ML IJ SOLN
0.5000 mg | Freq: Three times a day (TID) | INTRAMUSCULAR | Status: DC
  Administered 2015-04-01 – 2015-04-07 (×10): 0.5 mg via INTRAVENOUS
  Filled 2015-04-01 (×11): qty 1

## 2015-04-01 MED ORDER — POTASSIUM CHLORIDE 10 MEQ/50ML IV SOLN
10.0000 meq | INTRAVENOUS | Status: AC
  Administered 2015-04-01 (×4): 10 meq via INTRAVENOUS
  Filled 2015-04-01 (×3): qty 50

## 2015-04-01 NOTE — Progress Notes (Signed)
Subjective: Patient has become somewhat weaker with increasing breathing difficulty requiring BiPAP overnight and he can this morning. She was only able to go without BiPAP about 1 hour. She has not experienced side effects from current dose of Mestinon.  Objective: Current vital signs: BP 113/80 mmHg  Pulse 109  Temp(Src) 97.3 F (36.3 C) (Axillary)  Resp 25  Ht 4\' 8"  (1.422 m)  Wt 57.3 kg (126 lb 5.2 oz)  BMI 28.34 kg/m2  SpO2 100%  Neurologic Exam: Slightly lethargic but otherwise in no acute distress. She was moderately short of breath. She was able to follow commands well. Extraocular movements were full and conjugate with no diplopia. No facial weakness noted. Deltoid muscles are again weak bilaterally with 4/5 strength. Upper extremities otherwise normal except for slight weakness of right triceps. Strength of lower extremities remains normal, including hip flexors.  Vital capacity is morning was 400-500, and NIF was -30.  Medications: I have reviewed the patient's current medications.  Assessment/Plan: 81 year old lady with myasthenia gravis status post IVIG treatment and plasma exchange with continued respiratory if occult a requiring BiPAP. Patient is slightly weaker compared to evaluation 2 days ago.  I will increase Mestinon to 60 mg every 6 hours and will also start immunotherapy with prednisone at 10 mg per day. We will continue to follow this patient closely with you.  C.R. Roseanne RenoStewart, MD [facility] [phone]  04/01/2015  9:37 AM

## 2015-04-01 NOTE — Progress Notes (Signed)
Nutrition Follow-up  DOCUMENTATION CODES:   Severe malnutrition in context of chronic illness  INTERVENTION:  - If within goals of care, consider placing feeding tube. Recommend Jevity 1.2 @ 55 mL/hr, begin feeding at 25 mL/hr and increase slowly by 10 mL / every 4 hours to reach goal rate.  This regime provides 1584 kcal (100%), 73.26 gm protein (100%), and 1065 mL fluids. Free water flushes per MD.   - Modify Ensure Enlive to PRN.   NUTRITION DIAGNOSIS:   Malnutrition related to chronic illness, dysphagia as evidenced by severe depletion of muscle mass, percent weight loss.  Ongoing.  GOAL:   Patient will meet greater than or equal to 90% of their needs  Not Met.   MONITOR:   Vent status, Labs, Weight trends, TF tolerance, I & O's, Diet advancement, Skin  REASON FOR ASSESSMENT:   Consult, Ventilator Enteral/tube feeding initiation and management  ASSESSMENT:   81 year old female with 10 day history of dysphagia. Was admitted to [facility] for what was suspected to be myasthenia gravis flare. She was started on IVIG and mestinon, however eventually required intubation. Transferred to [facility] at [facility] request and for possible PLEX.   Patient visited for follow-up assessment post-extubation from Ventilator and diet advancement to Dysphagia 1.   Since transition from tube feedings, patient has achieved minimal PO intake and has had a poor appetite. Meal completion ranges from 10-40 %, and she receives feeding assistance at all meals. Patient has receiving Ensure Enlive BID, yet recommend modifying order due to minimal intake. Per nurse, patient has had increasing shortness of breath. Patient is on and off BiPap - which has to be stopped at all meals. Patient is missing some meals due to poor respiratory status and BiPAP dependence.  Per chart, Mestinon has been increased, and immunotherapy with Prednisone initiated.   Patient was enrolled in [facility] electrolyte replacement therapy  program with supplementation. Hypokalemia, hypomagnesemia and hypophosphatemia have resolved.   Per nurse, if patient's respiratory status does not stabilize, palliative care may be consulted. Care plan is to discharge patient to SNF.  Patient continues to meet criteria for severe malnutrition in chronic illness.   Labs: high PH, low PCO2, low PO2, low Cl, low ALT, low RBC, low heme  Medications reviewed.   Diet Order:  DIET - DYS 1 Room service appropriate?: Yes; Fluid consistency:: Nectar Thick  Skin:  Reviewed, no issues  Last BM:  03/30/2015  Height:   Ht Readings from Last 1 Encounters:  04/01/15 _0  (1.422 m)    Weight:   Wt Readings from Last 1 Encounters:  04/01/15 126 lb 5.2 oz (57.3 kg)    Ideal Body Weight:  50 kg  BMI:  Body mass index is 28.34 kg/(m^2).  Estimated Nutritional Needs:   Kcal:  1400-1700 kcal  Protein:  75-85 gm  Fluid:  1.5 L/day  EDUCATION NEEDS:   No education needs identified at this time  Kayleen Memos, Dietetic Intern 04/01/2015 3:54 PM

## 2015-04-01 NOTE — Progress Notes (Signed)
Cheyenne Boone TEAM 1 - Stepdown/ICU TEAM Progress Note  Cheyenne Boone EAV:[id number] DOB: 05-29-1924 DOA: 03/15/2015 PCP: Barbette Reichmann, MD  Admit HPI / Brief Narrative: 81 year old WF PMHx 10 day history of dysphagia. Was admitted to [facility] for what was suspected to be myasthenia gravis flare. She was started on on IVIG and mestinon, however eventually required intubation. Transferred to [facility] at family request for possible Plasma Exchange (PLEX).  HPI/Subjective: 10/20 A/O 4, positive waxing and waning SOB requiring BiPAP, negative CP.States did not have diagnosis of myasthenia gravis until this admission   Assessment/Plan: Acute respiratory failure in setting myasthenia gravis flare -BiPAP PRN;consider comfort measures and discontinuation of BiPAP if patient does not improve -8/20 Vital capacity (VC) q shift= 0.45 L -8/20 Negative inspiratory force (NIF) q shift= -32  Respiratory alkalosis noncompensated -Continue to treat underlying issue myasthenia gravis -Ativan 0.5 mg TID  Myasthenia gravis crisis -Neurology following; completed 4 rounds of PLEX  -Neurology increased Mestinon to 60 mg  q 6hr -Start prednisone 10 mg daily   Allergy to citrate w/ tongue swelling 5th PLEX tx cancelled as pt no longer intubated   Agitation -Resolved   Mucus plugging -S/P bronch & BAL 9/29  Aspiration PNA - H parahaemolytics on BAL -Has completed a course of abx tx  -Flutter valve -Neurology starting prednisone 10 mg daily  -Physiotherapy vest BID -DuoNeb QID  Acute Renal Failure  Resolved  Normocytic Anemia -Stable   Large hiatal hernia  Dysphagia -Cleared for D1 nectar diet per SLP   Hyperglycemia CBG well controlled   Hypokalemia Supplement as needed to keep at ~4.0  Hypomag Magnesium at goal  Severe malnutrition in context of chronic illness   Code Status: DO NOT RESUSCITATE Family Communication: None present at time of exam Disposition Plan: Per  neurology   Consultants: Dr.Daniel Daneil Dan PCCM  Dr.[facility] Neuro    Procedure/Significant Events: 9/27 - Admit for dysphagia  9/29 - Transfer to ICU @ [facility] for respiratory failure & intubated>>Bronchoscopy with bilateral secretions & plugging 10/01 - Extubated 10/03 - Reintubated 10/03 - Transfer to [facility] from [facility] 10/4 - HD cath placed 10/5 PLEX planned, EGD for OGT placement. PLEX stopped due to hypotension 10/7 - concern for allergic rxn to UNasyn or plex citrate, improved with treatment 10/8 - delirium severe--> precedex-->PEA short arrest--> woke up 10/13 - Extubated    Culture   Antibiotics: Unasyn 10/04>>10/7 Zosyn 9/29>>10/04 Vancomycin 9/29 >>10/04 Meropenem 10/7>>10/12  DVT prophylaxis: lovenox   Devices    LINES / TUBES:      Continuous Infusions: . sodium chloride 20 mL/hr at 04/01/15 0700    Objective: VITAL SIGNS: Temp: 97.3 F (36.3 C) (10/20 1442) Temp Source: Oral (10/20 1442) BP: 142/96 mmHg (10/20 1600) Pulse Rate: 102 (10/20 1600) SPO2; FIO2:   Intake/Output Summary (Last 24 hours) at 04/01/15 1920 Last data filed at 04/01/15 1900  Gross per 24 hour  Intake    620 ml  Output      0 ml  Net    620 ml     Exam: General: A/O 4, NAD, positive acute respiratory distress (requiring O2 support/BiPAP) Eyes: Negative headache, eye pain, double vision,negative scleral hemorrhage ENT: Negative Runny nose, negative ear pain, negative gingival bleeding, Neck:  Negative scars, masses, torticollis, lymphadenopathy, JVD Lungs: coarse breath sounds diffusely, negative wheezing or crackles  Cardiovascular: Regular rate and rhythm without murmur gallop or rub normal S1 and S2 Abdomen:negative abdominal pain, nondistended, positive soft, bowel sounds,  no rebound, no ascites, no appreciable mass Extremities: No significant cyanosis, clubbing, or edema bilateral lower extremities Psychiatric:  Negative depression,  negative anxiety, negative fatigue, negative mania  Neurologic:  Cranial nerves II through XII intact, tongue/uvula midline, all extremities muscle strength 5/5, sensation intact throughout, negative dysarthria, negative expressive aphasia, negative receptive aphasia.   Data Reviewed: Basic Metabolic Panel:  Recent Labs Lab 03/26/15 0500 03/27/15 0530 03/28/15 0620 03/31/15 0500 04/01/15 0415  NA 134* 134* 136 143 145  K 3.8 3.5 3.8 3.2* 4.0  CL 97* 95* 99* 107 113*  CO2 28 29 29 27 27   GLUCOSE 113* 122* 108* 120* 108*  BUN 21* 21* 22* 22* 17  CREATININE 0.76 0.84 0.92 0.77 0.75  CALCIUM 8.6* 9.0 8.5* 9.0 9.0  MG  --   --  2.0 2.0 2.1   Liver Function Tests:  Recent Labs Lab 03/28/15 0620 03/31/15 0500 04/01/15 0415  AST 16 16 15   ALT 13* 14 12*  ALKPHOS 68 109 108  BILITOT 2.1* 0.9 0.9  PROT 4.9* 5.5* 5.4*  ALBUMIN 2.9* 2.8* 2.7*   No results for input(s): LIPASE, AMYLASE in the last 168 hours. No results for input(s): AMMONIA in the last 168 hours. CBC:  Recent Labs Lab 03/27/15 0530 03/27/15 2338 03/28/15 0620 03/31/15 0500 04/01/15 0415  WBC 16.4* 9.7 10.8* 7.4 6.9  NEUTROABS  --   --   --  5.9 5.0  HGB 10.1* 8.0* 8.1* 8.4* 8.2*  HCT 31.2* 24.6* 25.1* 26.2* 26.6*  MCV 94.3 95.3 95.1 97.0 97.8  PLT 362 316 313 402* 430*   Cardiac Enzymes: No results for input(s): CKTOTAL, CKMB, CKMBINDEX, TROPONINI in the last 168 hours. BNP (last 3 results)  Recent Labs  03/15/15 2246  BNP 314.2*    ProBNP (last 3 results) No results for input(s): PROBNP in the last 8760 hours.  CBG:  Recent Labs Lab 03/28/15 1956 03/28/15 2304 03/29/15 0357 03/29/15 0800 03/29/15 1216  GLUCAP 103* 109* 99 104* 91    No results found for this or any previous visit (from the past 240 hour(s)).   Studies:  Recent x-ray studies have been reviewed in detail by the Attending Physician  Scheduled Meds:  Scheduled Meds: . antiseptic oral rinse  7 mL Mouth Rinse  q12n4p  . chlorhexidine  15 mL Mouth Rinse BID  . enoxaparin (LOVENOX) injection  40 mg Subcutaneous Q24H  . feeding supplement (ENSURE ENLIVE)  237 mL Oral BID BM  . hydroxypropyl methylcellulose / hypromellose  1 drop Both Eyes TID  . ipratropium-albuterol  3 mL Nebulization QID  . latanoprost  1 drop Right Eye QHS  . LORazepam  0.5 mg Intravenous TID  . polyethylene glycol  17 g Oral Daily  . predniSONE  10 mg Oral Q1200  . pyridostigmine  60 mg Oral 4 times per day    Time spent on care of this patient: 40 mins   Evangelynn Lochridge, Roselind MessierURTIS J , MD  [facility] Office  [phone] Pager - [phone]  On-Call/Text Page:      [email or web address] [email or web address]      password [email or web address]  If 7PM-7AM, please contact night-coverage [email or web address] Password [email or web address] 04/01/2015, 7:20 PM   LOS: 17 days   Care during the described time interval was provided by me .  I have reviewed this patient's available data, including medical history, events of note, physical examination, and all test results as part of my evaluation. I have personally reviewed and interpreted all radiology studies.  Catcher Dehoyos, MD [phone] Pager    

## 2015-04-01 NOTE — Progress Notes (Signed)
Respiratory Note:  NIF -32 VC  0.45 L

## 2015-04-01 NOTE — Progress Notes (Signed)
SLP Cancellation Note  Patient Details Name: Cheyenne Boone MRN: [id number] DOB: 03/22/1924   Cancelled treatment:       Reason Eval/Treat Not Completed: Medical issues which prohibited therapy, pt on BiPAP. Discussed feeding with RN, recommend trying dysphagia 1 (puree) and meds at lunch.   Cheyenne Boone, Student-SLP  Cheyenne DubinKristen Geneive Boone 04/01/2015, 11:44 AM

## 2015-04-01 NOTE — Progress Notes (Signed)
Pt NIF -28 and VC 350 with fairly good effort

## 2015-04-01 NOTE — Care Management Important Message (Signed)
Important Message  Patient Details  Name: Cheyenne Boone MRN: [id number] Date of Birth: 05/21/1924   Medicare Important Message Given:  Yes-fourth notification given    Kyla BalzarineShealy, Elyjah Hazan Abena 04/01/2015, 1:16 PM

## 2015-04-02 DIAGNOSIS — J690 Pneumonitis due to inhalation of food and vomit: Secondary | ICD-10-CM | POA: Diagnosis present

## 2015-04-02 LAB — COMPREHENSIVE METABOLIC PANEL
ALT: 12 U/L — ABNORMAL LOW (ref 14–54)
AST: 15 U/L (ref 15–41)
Albumin: 2.8 g/dL — ABNORMAL LOW (ref 3.5–5.0)
Alkaline Phosphatase: 119 U/L (ref 38–126)
Anion gap: 6 (ref 5–15)
BUN: 16 mg/dL (ref 6–20)
CHLORIDE: 110 mmol/L (ref 101–111)
CO2: 26 mmol/L (ref 22–32)
Calcium: 8.9 mg/dL (ref 8.9–10.3)
Creatinine, Ser: 0.78 mg/dL (ref 0.44–1.00)
Glucose, Bld: 118 mg/dL — ABNORMAL HIGH (ref 65–99)
POTASSIUM: 3.6 mmol/L (ref 3.5–5.1)
SODIUM: 142 mmol/L (ref 135–145)
Total Bilirubin: 0.5 mg/dL (ref 0.3–1.2)
Total Protein: 5.5 g/dL — ABNORMAL LOW (ref 6.5–8.1)

## 2015-04-02 LAB — CBC WITH DIFFERENTIAL/PLATELET
Basophils Absolute: 0 10*3/uL (ref 0.0–0.1)
Basophils Relative: 0 %
Eosinophils Absolute: 0.1 10*3/uL (ref 0.0–0.7)
Eosinophils Relative: 1 %
HCT: 27.1 % — ABNORMAL LOW (ref 36.0–46.0)
HEMOGLOBIN: 8.4 g/dL — AB (ref 12.0–15.0)
LYMPHS ABS: 0.9 10*3/uL (ref 0.7–4.0)
LYMPHS PCT: 10 %
MCH: 30.2 pg (ref 26.0–34.0)
MCHC: 31 g/dL (ref 30.0–36.0)
MCV: 97.5 fL (ref 78.0–100.0)
Monocytes Absolute: 0.8 10*3/uL (ref 0.1–1.0)
Monocytes Relative: 9 %
NEUTROS PCT: 80 %
Neutro Abs: 6.6 10*3/uL (ref 1.7–7.7)
Platelets: 424 10*3/uL — ABNORMAL HIGH (ref 150–400)
RBC: 2.78 MIL/uL — AB (ref 3.87–5.11)
RDW: 16.1 % — ABNORMAL HIGH (ref 11.5–15.5)
WBC: 8.4 10*3/uL (ref 4.0–10.5)

## 2015-04-02 LAB — MAGNESIUM: Magnesium: 2 mg/dL (ref 1.7–2.4)

## 2015-04-02 NOTE — Progress Notes (Signed)
Speech Language Pathology Treatment:    Patient Details Name: Cheyenne Boone MRN: [id number] DOB: 11/13/1923 Today's Date: 04/02/2015 Time: [id number] SLP Time Calculation (min) (ACUTE ONLY): 11 min  Assessment / Plan / Recommendation Clinical Impression  Pt seen for check of diet tolerance. RN reports pt was able to eat a good amount of puree meal in am on mask but was noted to have rhonchorous breathing after liquids. Rn also reported this yesterday. Upon arrival pt with wet vocal quality which she cleared with verbal cue to expectorate secretions. Provided minimal trials of honey thick water via teaspoon, which pt tolerated well, though she didn't like it. Given fluctuations in strength and endurance, recommend downgrade to dys 1 (puree) with honey thick liquids. RN aware. Will follow for tolerance.    HPI     Pertinent Vitals    SLP Plan  Continue with current plan of care    Recommendations Diet recommendations: Dysphagia 1 (puree);Honey-thick liquid Liquids provided via: Teaspoon Medication Administration: Crushed with puree Supervision: Full supervision/cueing for compensatory strategies;Trained caregiver to feed patient;Staff to assist with self feeding Compensations: Minimize environmental distractions;Slow rate;Small sips/bites Postural Changes and/or Swallow Maneuvers: Seated upright 90 degrees              Oral Care Recommendations: Oral care BID Follow up Recommendations: Skilled Nursing facility Plan: Continue with current plan of care    GO    Roper St Francis Eye CenterBonnie Eldra Word, MA CCC-SLP [phone]  [name], [name] 04/02/2015, 11:06 AM

## 2015-04-02 NOTE — Clinical Social Work Note (Signed)
[facility] when discharged. Report left for weekend CSW.   Roddie McBryant Dohn Stclair MSW, Milton[place]CSW, [place], [phone]

## 2015-04-02 NOTE — Progress Notes (Signed)
[place] TEAM 1 - Stepdown/ICU TEAM Progress Note  Cheyenne Boone ZOX:[id number] DOB: 09-06-1923 DOA: 03/15/2015 PCP: Barbette Reichmann, MD  Admit HPI / Brief Narrative: 81 year old WF PMHx 10 day history of dysphagia. Was admitted to [facility] for what was suspected to be myasthenia gravis flare. She was started on on IVIG and mestinon, however eventually required intubation. Transferred to [facility] at family request for possible Plasma Exchange (PLEX).  HPI/Subjective: 10/21 A/O 1 (patient did not know where, when, why), continued positive waxing and waning SOB requiring Ventimask/BiPAP, negative CP.   Assessment/Plan: Acute respiratory failure in setting myasthenia gravis flare -BiPAP PRN;consider comfort measures and discontinuation of BiPAP if patient does not improve -8/21 Vital capacity (VC) q shift= 0.45 L -8/21 Negative inspiratory force (NIF) q shift= -20 -Patient NIF has trended down overnight, although clinically patient appears stable, this is concerning. Placed consult for palliative care  Respiratory alkalosis noncompensated -Continue to treat underlying issue myasthenia gravis -Ativan 0.5 mg TID for anxiety  Myasthenia gravis crisis -Neurology following; completed 4 rounds of PLEX  -Neurology increased Mestinon to 60 mg  q 6hr -Continue prednisone 10 mg daily   Allergy to citrate w/ tongue swelling -5th PLEX tx cancelled as pt no longer intubated   Agitation -Ativan 0.5 mg TID   Mucus plugging -S/P bronch & BAL 9/29  Aspiration PNA - H parahaemolytics on BAL -Has completed a course of abx tx  -Flutter valve -Neurology continueing prednisone 10 mg daily  -Physiotherapy vest BID -DuoNeb QID  Acute Renal Failure  -Resolved  Normocytic Anemia -Stable   Large hiatal hernia  Dysphagia -Cleared for D1 nectar diet per SLP   Hyperglycemia -CBG well controlled   Hypokalemia -Supplement as needed to keep at ~4.0  Hypomag -Magnesium at goal  Severe  malnutrition in context of chronic illness  Goals of care -Unsure given severity of patient's disease process she is going to recuperate. Discuss short term vs long-term goals of care   Code Status: DO NOT RESUSCITATE Family Communication: None present at time of exam Disposition Plan: Per neurology   Consultants: Dr.Daniel Daneil Dan PCCM  Dr.Charles Roseanne Reno Neuro    Procedure/Significant Events: 9/27 - Admit for dysphagia  9/29 - Transfer to ICU @ [facility] for respiratory failure & intubated>>Bronchoscopy with bilateral secretions & plugging 10/01 - Extubated 10/03 - Reintubated 10/03 - Transfer to [facility] from [facility] 10/4 - HD cath placed 10/5 PLEX planned, EGD for OGT placement. PLEX stopped due to hypotension 10/7 - concern for allergic rxn to UNasyn or plex citrate, improved with treatment 10/8 - delirium severe--> precedex-->PEA short arrest--> woke up 10/13 - Extubated    Culture   Antibiotics: Unasyn 10/04>>10/7 Zosyn 9/29>>10/04 Vancomycin 9/29 >>10/04 Meropenem 10/7>>10/12  DVT prophylaxis: lovenox   Devices    LINES / TUBES:      Continuous Infusions: . sodium chloride 20 mL/hr at 04/01/15 0700    Objective: VITAL SIGNS: Temp: 98.6 F (37 C) (10/21 0730) Temp Source: Oral (10/21 0730) BP: 143/73 mmHg (10/21 0730) Pulse Rate: 94 (10/21 0700) SPO2; FIO2:   Intake/Output Summary (Last 24 hours) at 04/02/15 1000 Last data filed at 04/02/15 0900  Gross per 24 hour  Intake    860 ml  Output      0 ml  Net    860 ml     Exam: General: A/O 1 (patient did not know where, when, why), NAD, positive acute respiratory distress (requiring O2 support/BiPAP) Eyes: Negative headache, eye pain, double  vision,negative scleral hemorrhage ENT: Negative Runny nose, negative ear pain, negative gingival bleeding, Neck:  Negative scars, masses, torticollis, lymphadenopathy, JVD Lungs: Rhonchi diffusely, believe majority are bronchial in nature,  negative wheezing or crackles  Cardiovascular: Tachycardic, Regular rhythm without murmur gallop or rub normal S1 and S2 Abdomen:negative abdominal pain, nondistended, positive soft, bowel sounds, no rebound, no ascites, no appreciable mass Extremities: No significant cyanosis, clubbing, or edema bilateral lower extremities Psychiatric:  Negative depression, negative anxiety, negative fatigue, negative mania  Neurologic:  Cranial nerves II through XII intact, tongue/uvula midline, all extremities muscle strength 5/5, sensation intact throughout, negative dysarthria, negative expressive aphasia, negative receptive aphasia.   Data Reviewed: Basic Metabolic Panel:  Recent Labs Lab 03/27/15 0530 03/28/15 0620 03/31/15 0500 04/01/15 0415 04/02/15 0435  NA 134* 136 143 145 142  K 3.5 3.8 3.2* 4.0 3.6  CL 95* 99* 107 113* 110  CO2 GLUCOSE 122* 108* 120* 108* 118*  BUN 21* 22* 22* 17 16  CREATININE 0.84 0.92 0.77 0.75 0.78  CALCIUM 9.0 8.5* 9.0 9.0 8.9  MG  --  2.0 2.0 2.1 2.0   Liver Function Tests:  Recent Labs Lab 03/28/15 0620 03/31/15 0500 04/01/15 0415 04/02/15 0435  AST ALT 13* 14 12* 12*  ALKPHOS 68 109 108 119  BILITOT 2.1* 0.9 0.9 0.5  PROT 4.9* 5.5* 5.4* 5.5*  ALBUMIN 2.9* 2.8* 2.7* 2.8*   No results for input(s): LIPASE, AMYLASE in the last 168 hours. No results for input(s): AMMONIA in the last 168 hours. CBC:  Recent Labs Lab 03/27/15 2338 03/28/15 0620 03/31/15 0500 04/01/15 0415 04/02/15 0435  WBC 9.7 10.8* 7.4 6.9 8.4  NEUTROABS  --   --  5.9 5.0 6.6  HGB 8.0* 8.1* 8.4* 8.2* 8.4*  HCT 24.6* 25.1* 26.2* 26.6* 27.1*  MCV 95.3 95.1 97.0 97.8 97.5  PLT 316 313 402* 430* 424*   Cardiac Enzymes: No results for input(s): CKTOTAL, CKMB, CKMBINDEX, TROPONINI in the last 168 hours. BNP (last 3 results)  Recent Labs  03/15/15 2246  BNP 314.2*    ProBNP (last 3 results) No results for input(s): PROBNP in the last 8760  hours.  CBG:  Recent Labs Lab 03/28/15 1956 03/28/15 2304 03/29/15 0357 03/29/15 0800 03/29/15 1216  GLUCAP 103* 109* 99 104* 91    No results found for this or any previous visit (from the past 240 hour(s)).   Studies:  Recent x-ray studies have been reviewed in detail by the Attending Physician  Scheduled Meds:  Scheduled Meds: . antiseptic oral rinse  7 mL Mouth Rinse q12n4p  . chlorhexidine  15 mL Mouth Rinse BID  . enoxaparin (LOVENOX) injection  40 mg Subcutaneous Q24H  . feeding supplement (ENSURE ENLIVE)  237 mL Oral BID BM  . hydroxypropyl methylcellulose / hypromellose  1 drop Both Eyes TID  . ipratropium-albuterol  3 mL Nebulization QID  . latanoprost  1 drop Right Eye QHS  . LORazepam  0.5 mg Intravenous TID  . polyethylene glycol  17 g Oral Daily  . predniSONE  10 mg Oral Q1200  . pyridostigmine  60 mg Oral 4 times per day    Time spent on care of this patient: 40 mins   Caily Rakers, Roselind Messier , MD  [facility] Office  [phone] Pager - [phone]  On-Call/Text Page:      [email or web address]      password [email or web address]  If 7PM-7AM, please contact night-coverage  [email or web address] Password [email or web address] 04/02/2015, 10:00 AM   LOS: 18 days   Care during the described time interval was provided by me .  I have reviewed this patient's available data, including medical history, events of note, physical examination, and all test results as part of my evaluation. I have personally reviewed and interpreted all radiology studies.   Carolyne Littlesurtis Daltin Crist, MD [phone] Pager

## 2015-04-02 NOTE — Progress Notes (Signed)
Placed pt on BiPAP at this time due to high respiratory rate and pt seemed to have labored breathing, RT will continue to monitor

## 2015-04-02 NOTE — Progress Notes (Signed)
Subjective: Patient had no new complaints. She continues to have breathing difficulty but is currently on Ventimask. She still able to swallow adequately. He was started on low-dose prednisone yesterday with no apparent side effects noted.  Objective: Current vital signs: BP 143/73 mmHg  Pulse 94  Temp(Src) 98.6 F (37 C) (Oral)  Resp 24  Ht 4\' 8"  (1.422 m)  Wt 57.5 kg (126 lb 12.2 oz)  BMI 28.44 kg/m2  SpO2 100%  Neurologic Exam: Was alert and out of bed sitting in a chair and in no acute distress. Mental status was normal. Extraocular movements were full and conjugate with no diplopia. She had no ptosis of eyelids and prolonged upgaze. There was no facial weakness. Strength of extremities was unchanged with mild weakness of deltoid, laterally as well as mild weakness of hip flexors bilaterally. Extremity strength was otherwise normal.  Medications: I have reviewed the patient's current medications.  Assessment/Plan: 81 year old lady with myasthenia gravis who presented with crisis, requiring plasma exchange following failure of IVIG course of treatment. She improved with plasma exchange which to be discontinued because of allergic reactions, presumably due to citrate buffer. She remains extubated and respiratory status fluctuates from using Ventimask and oxygen to requiring BiPAP intermittently. Her ability to swallow remained stable.  Recommend no changes in current management with continuing Mestinon at 60 mg every 6 hours and prednisone 10 mg per day. Physical therapy, occupational therapy and SLP intervention to continue as well.  We will continue to follow this patient closely with you  C.R. Roseanne RenoStewart, MD [facility] [phone]  04/02/2015  9:09 AM

## 2015-04-02 NOTE — Progress Notes (Signed)
UR COMPLETED  

## 2015-04-02 NOTE — Progress Notes (Signed)
NIF -20, VC .  Patient tolerated fair.

## 2015-04-03 DIAGNOSIS — E876 Hypokalemia: Secondary | ICD-10-CM | POA: Diagnosis present

## 2015-04-03 DIAGNOSIS — R739 Hyperglycemia, unspecified: Secondary | ICD-10-CM

## 2015-04-03 DIAGNOSIS — Z515 Encounter for palliative care: Secondary | ICD-10-CM

## 2015-04-03 LAB — CBC WITH DIFFERENTIAL/PLATELET
Basophils Absolute: 0 10*3/uL (ref 0.0–0.1)
Basophils Relative: 0 %
EOS ABS: 0.2 10*3/uL (ref 0.0–0.7)
EOS PCT: 3 %
HCT: 22.7 % — ABNORMAL LOW (ref 36.0–46.0)
Hemoglobin: 7.2 g/dL — ABNORMAL LOW (ref 12.0–15.0)
LYMPHS ABS: 0.9 10*3/uL (ref 0.7–4.0)
Lymphocytes Relative: 14 %
MCH: 30.9 pg (ref 26.0–34.0)
MCHC: 31.7 g/dL (ref 30.0–36.0)
MCV: 97.4 fL (ref 78.0–100.0)
MONOS PCT: 11 %
Monocytes Absolute: 0.7 10*3/uL (ref 0.1–1.0)
Neutro Abs: 4.5 10*3/uL (ref 1.7–7.7)
Neutrophils Relative %: 72 %
PLATELETS: 359 10*3/uL (ref 150–400)
RBC: 2.33 MIL/uL — AB (ref 3.87–5.11)
RDW: 16.1 % — AB (ref 11.5–15.5)
WBC: 6.2 10*3/uL (ref 4.0–10.5)

## 2015-04-03 LAB — COMPREHENSIVE METABOLIC PANEL
ALT: 12 U/L — AB (ref 14–54)
ANION GAP: 5 (ref 5–15)
AST: 17 U/L (ref 15–41)
Albumin: 2.5 g/dL — ABNORMAL LOW (ref 3.5–5.0)
Alkaline Phosphatase: 108 U/L (ref 38–126)
BILIRUBIN TOTAL: 0.6 mg/dL (ref 0.3–1.2)
BUN: 17 mg/dL (ref 6–20)
CO2: 28 mmol/L (ref 22–32)
CREATININE: 0.72 mg/dL (ref 0.44–1.00)
Calcium: 8.4 mg/dL — ABNORMAL LOW (ref 8.9–10.3)
Chloride: 108 mmol/L (ref 101–111)
GFR calc non Af Amer: >60 mL/min (ref >60)
Glucose, Bld: 97 mg/dL (ref 65–99)
POTASSIUM: 3.6 mmol/L (ref 3.5–5.1)
Sodium: 141 mmol/L (ref 135–145)
Total Protein: 4.8 g/dL — ABNORMAL LOW (ref 6.5–8.1)

## 2015-04-03 LAB — MAGNESIUM: MAGNESIUM: 2 mg/dL (ref 1.7–2.4)

## 2015-04-03 NOTE — Progress Notes (Signed)
Spoke with pt's daughter Racheal PatchesCathy Garwood at [phone] about setting up a family meeting. She lives in [place] and is planning to be here by noon. Plan is for Mrs. Lynnell ChadGarwood to call Palliative Care when she is on the road. Pt has 2 daughters, Mrs. Lynnell ChadGarwood as well as Baltazar ApoBrenda Lee at [phone]. Mrs. Lynnell ChadGarwood is going to call her sister to see if she would like to be part of discussion. There are no other siblings per her report. Neither have written HCPOA. Mrs. Lynnell ChadGarwood is in agreement with DNR/DNI but would want BIPAP placed if she were to get into respiratory trouble. Will address this as well as other goals and what to possibly expect going forward. Mrs. Lynnell ChadGarwood would very mcuh like to have physician at meeting.  Eduard RouxSarah Alisa Stjames, ANP Palliative Medicine Team

## 2015-04-03 NOTE — Progress Notes (Addendum)
[place] TEAM 1 - Stepdown/ICU TEAM Progress Note  Cheyenne Boone:[id number] DOB: [birth year] DOA: 03/15/2015 PCP: Barbette Reichmann, MD  Admit HPI / Brief Narrative: 81 year old WF PMHx 10 day history of dysphagia. Was admitted to [facility] for what was suspected to be myasthenia gravis flare. She was started on on IVIG and mestinon, however eventually required intubation. Transferred to [facility] at family request for possible Plasma Exchange (PLEX).  HPI/Subjective: 10/22 A/O 1 (patient did not know where, when, why), continued positive waxing and waning SOB requiring Ventimask/BiPAP, negative CP. Patient using accessory muscles to breathe, can talk in 3 word sentences only   Assessment/Plan: Acute respiratory failure in setting myasthenia gravis flare -BiPAP PRN;consider comfort measures and discontinuation of BiPAP if patient does not improve -8/22 Vital capacity (VC) q shift= 0.50 L -8/22 Negative inspiratory force (NIF) q shift= -20 -Patient NIF borderline for intubation except patient DO NOT RESUSCITATE. Patient only able to remain off on Ventimask/BiPAP for short periods without becoming extremely tired secondary to WOB. -Palliative Care has arranged meeting for 10/23, will contact Dr.Charles Stewart Neuro in a.m. to see if he would like to attend. Patient appears to be declining.  Respiratory alkalosis noncompensated -Continue to treat underlying issue myasthenia gravis -Ativan 0.5 mg TID for anxiety  Myasthenia gravis crisis -Neurology following; completed 4 rounds of PLEX  -Neurology increased Mestinon to 60 mg  q 6hr -Continue prednisone 10 mg daily   Allergy to citrate w/ tongue swelling -5th PLEX tx cancelled as pt no longer intubated   Agitation -Ativan 0.5 mg TID   Mucus plugging -S/P bronch & BAL 9/29  Aspiration PNA - H parahaemolytics on BAL -Has completed a course of abx tx  -Flutter valve -Neurology continueing prednisone 10 mg daily  -Physiotherapy vest  BID -DuoNeb QID  Acute Renal Failure  -Resolved  Normocytic Anemia -Stable   Large hiatal hernia  Dysphagia -Cleared for D1 nectar diet per SLP   Hyperglycemia -CBG well controlled   Hypokalemia -Supplement as needed to keep at ~4.0  Hypomag -Magnesium at goal  Severe malnutrition in context of chronic illness  Goals of care -Unsure given severity of patient's disease process she is going to recuperate. Discuss short term vs long-term goals of care -10/22 Contacted Racheal Patches at [phone], daughter and answered all questions. Will inform staff in the a.m. when daughter arrives for palliative care meeting to page me for meeting.   Code Status: DO NOT RESUSCITATE Family Communication: None present at time of exam Disposition Plan: Per neurology   Consultants: Dr.Daniel Daneil Dan PCCM  Dr.Charles Roseanne Reno Neuro    Procedure/Significant Events: 9/27 - Admit for dysphagia  9/29 - Transfer to ICU @ [facility] for respiratory failure & intubated>>Bronchoscopy with bilateral secretions & plugging 10/01 - Extubated 10/03 - Reintubated 10/03 - Transfer to [facility] 10/4 - HD cath placed 10/5 PLEX planned, EGD for OGT placement. PLEX stopped due to hypotension 10/7 - concern for allergic rxn to UNasyn or plex citrate, improved with treatment 10/8 - delirium severe--> precedex-->PEA short arrest--> woke up 10/13 - Extubated    Culture   Antibiotics: Unasyn 10/04>>10/7 Zosyn 9/29>>10/04 Vancomycin 9/29 >>10/04 Meropenem 10/7>>10/12  DVT prophylaxis: lovenox   Devices    LINES / TUBES:      Continuous Infusions: . sodium chloride 20 mL/hr at 04/03/15 2000    Objective: VITAL SIGNS: Temp: 98.1 F (36.7 C) (10/22 1933) Temp Source: Oral (10/22 1933) BP: 156/104 mmHg (10/22 2000) Pulse Rate:  95 (10/22 2000) SPO2; FIO2:   Intake/Output Summary (Last 24 hours) at 04/03/15 2047 Last data filed at 04/03/15 2000  Gross per 24  hour  Intake    500 ml  Output      0 ml  Net    500 ml     Exam: General: A/O 1 (patient did not know where, when, why), NAD, positive acute respiratory distress (requiring O2 support/BiPAP) Eyes: Negative headache, eye pain, double vision,negative scleral hemorrhage ENT: Negative Runny nose, negative ear pain, negative gingival bleeding, Neck:  Negative scars, masses, torticollis, lymphadenopathy, JVD Lungs: poor air movement, negative wheezing or crackles,Patient using accessory muscles to breathe, can talk in 3 word sentences only  Cardiovascular: Tachycardic, Regular rhythm without murmur gallop or rub normal S1 and S2 Abdomen:negative abdominal pain, nondistended, positive soft, bowel sounds, no rebound, no ascites, no appreciable mass Extremities: No significant cyanosis, clubbing, or edema bilateral lower extremities Psychiatric:  Negative depression, negative anxiety, negative fatigue, negative mania  Neurologic:  Cranial nerves II through XII intact, tongue/uvula midline, all extremities muscle strength 5/5, sensation intact throughout, negative dysarthria, negative expressive aphasia, negative receptive aphasia.   Data Reviewed: Basic Metabolic Panel:  Recent Labs Lab 03/28/15 0620 03/31/15 0500 04/01/15 0415 04/02/15 0435 04/03/15 0515  NA 136 143 145 142 141  K 3.8 3.2* 4.0 3.6 3.6  CL 99* 107 113* 110 108  CO2 GLUCOSE 108* 120* 108* 118* 97  BUN 22* 22* CREATININE 0.92 0.77 0.75 0.78 0.72  CALCIUM 8.5* 9.0 9.0 8.9 8.4*  MG 2.0 2.0 2.1 2.0 2.0   Liver Function Tests:  Recent Labs Lab 03/28/15 0620 03/31/15 0500 04/01/15 0415 04/02/15 0435 04/03/15 0515  AST ALT 13* 14 12* 12* 12*  ALKPHOS 68 109 108 119 108  BILITOT 2.1* 0.9 0.9 0.5 0.6  PROT 4.9* 5.5* 5.4* 5.5* 4.8*  ALBUMIN 2.9* 2.8* 2.7* 2.8* 2.5*   No results for input(s): LIPASE, AMYLASE in the last 168 hours. No results for input(s): AMMONIA in the  last 168 hours. CBC:  Recent Labs Lab 03/28/15 0620 03/31/15 0500 04/01/15 0415 04/02/15 0435 04/03/15 0515  WBC 10.8* 7.4 6.9 8.4 6.2  NEUTROABS  --  5.9 5.0 6.6 4.5  HGB 8.1* 8.4* 8.2* 8.4* 7.2*  HCT 25.1* 26.2* 26.6* 27.1* 22.7*  MCV 95.1 97.0 97.8 97.5 97.4  PLT 313 402* 430* 424* 359   Cardiac Enzymes: No results for input(s): CKTOTAL, CKMB, CKMBINDEX, TROPONINI in the last 168 hours. BNP (last 3 results)  Recent Labs  03/15/15 2246  BNP 314.2*    ProBNP (last 3 results) No results for input(s): PROBNP in the last 8760 hours.  CBG:  Recent Labs Lab 03/28/15 1956 03/28/15 2304 03/29/15 0357 03/29/15 0800 03/29/15 1216  GLUCAP 103* 109* 99 104* 91    No results found for this or any previous visit (from the past 240 hour(s)).   Studies:  Recent x-ray studies have been reviewed in detail by the Attending Physician  Scheduled Meds:  Scheduled Meds: . antiseptic oral rinse  7 mL Mouth Rinse q12n4p  . chlorhexidine  15 mL Mouth Rinse BID  . enoxaparin (LOVENOX) injection  40 mg Subcutaneous Q24H  . feeding supplement (ENSURE ENLIVE)  237 mL Oral BID BM  . hydroxypropyl methylcellulose / hypromellose  1 drop Both Eyes TID  . ipratropium-albuterol  3 mL Nebulization QID  . latanoprost  1 drop  Right Eye QHS  . LORazepam  0.5 mg Intravenous TID  . polyethylene glycol  17 g Oral Daily  . predniSONE  10 mg Oral Q1200  . pyridostigmine  60 mg Oral 4 times per day    Time spent on care of this patient: 40 mins   Tiera Mensinger, Roselind MessierURTIS J , MD  [facility] Office  [phone] Pager - [phone]  On-Call/Text Page:      [email or web address]      password [email or web address]  If 7PM-7AM, please contact night-coverage [email or web address] Password [email or web address] 04/03/2015, 8:47 PM   LOS: 19 days   Care during the described time interval was provided by me .  I have reviewed this patient's available data, including medical history, events of note, physical examination, and all test  results as part of my evaluation. I have personally reviewed and interpreted all radiology studies.   Carolyne Littlesurtis Roel Douthat, MD [phone] Pager

## 2015-04-03 NOTE — Progress Notes (Signed)
Subjective: Patient had no new complaints. She is still intermittently requiring BiPAP because of breathing difficulty. She is currently on a Ventimask with no complaints. She apparently has been agitated and is wearing mittens.  Objective: Current vital signs: BP 146/82 mmHg  Pulse 88  Temp(Src) 97.7 F (36.5 C) (Oral)  Resp 19  Ht 4\' 8"  (1.422 m)  Wt 54.8 kg (120 lb 13 oz)  BMI 27.10 kg/m2  SpO2 100%  Neurologic Exam: Patient was alert and in no acute distress. Mental status was normal. Extraocular movements were full and conjugate with no diplopia in any field of gaze. Facial strength was normal.  Speech was much improved, particularly phonation. Strength of upper extremities was normal except for minimal left deltoid weakness. Hip flexors strength was normal bilaterally, as well as distal lower extremity strength.  Medications: I have reviewed the patient's current medications.  Assessment/Plan: 81 year old lady with myasthenia gravis who presented with crisis, requiring plasma exchange following failure of IVIG course of treatment. She improved with plasma exchange which to be discontinued because of allergic reactions, presumably due to citrate buffer. Patient is stronger today than yesterday and speech is clearly better.  Recommend no changes in current management. We will continue to follow as well.  C.R. Roseanne RenoStewart, MD [facility] [phone]  04/03/2015  8:56 AM

## 2015-04-03 NOTE — Progress Notes (Signed)
NIF: -20, VC: 500 with fairly good patient effort.

## 2015-04-04 LAB — CBC WITH DIFFERENTIAL/PLATELET
Basophils Absolute: 0 10*3/uL (ref 0.0–0.1)
Basophils Relative: 0 %
EOS PCT: 4 %
Eosinophils Absolute: 0.3 10*3/uL (ref 0.0–0.7)
HCT: 25 % — ABNORMAL LOW (ref 36.0–46.0)
Hemoglobin: 7.8 g/dL — ABNORMAL LOW (ref 12.0–15.0)
LYMPHS ABS: 0.9 10*3/uL (ref 0.7–4.0)
LYMPHS PCT: 12 %
MCH: 30 pg (ref 26.0–34.0)
MCHC: 31.2 g/dL (ref 30.0–36.0)
MCV: 96.2 fL (ref 78.0–100.0)
MONO ABS: 0.8 10*3/uL (ref 0.1–1.0)
Monocytes Relative: 11 %
Neutro Abs: 5.4 10*3/uL (ref 1.7–7.7)
Neutrophils Relative %: 73 %
PLATELETS: 379 10*3/uL (ref 150–400)
RBC: 2.6 MIL/uL — ABNORMAL LOW (ref 3.87–5.11)
RDW: 15.7 % — AB (ref 11.5–15.5)
WBC: 7.4 10*3/uL (ref 4.0–10.5)

## 2015-04-04 LAB — COMPREHENSIVE METABOLIC PANEL
ALBUMIN: 2.6 g/dL — AB (ref 3.5–5.0)
ALT: 14 U/L (ref 14–54)
AST: 18 U/L (ref 15–41)
Alkaline Phosphatase: 119 U/L (ref 38–126)
Anion gap: 4 — ABNORMAL LOW (ref 5–15)
BILIRUBIN TOTAL: 0.6 mg/dL (ref 0.3–1.2)
BUN: 16 mg/dL (ref 6–20)
CHLORIDE: 108 mmol/L (ref 101–111)
CO2: 28 mmol/L (ref 22–32)
Calcium: 8.5 mg/dL — ABNORMAL LOW (ref 8.9–10.3)
Creatinine, Ser: 0.73 mg/dL (ref 0.44–1.00)
GFR calc Af Amer: >60 mL/min (ref >60)
GFR calc non Af Amer: >60 mL/min (ref >60)
GLUCOSE: 96 mg/dL (ref 65–99)
POTASSIUM: 3.5 mmol/L (ref 3.5–5.1)
Sodium: 140 mmol/L (ref 135–145)
Total Protein: 5.1 g/dL — ABNORMAL LOW (ref 6.5–8.1)

## 2015-04-04 LAB — MAGNESIUM: Magnesium: 1.8 mg/dL (ref 1.7–2.4)

## 2015-04-04 MED ORDER — ALBUTEROL SULFATE (2.5 MG/3ML) 0.083% IN NEBU: 2.5000 mg | INHALATION_SOLUTION | RESPIRATORY_TRACT | Status: DC | PRN

## 2015-04-04 MED ORDER — MORPHINE SULFATE (PF) 2 MG/ML IV SOLN
1.0000 mg | INTRAVENOUS | Status: DC | PRN
  Administered 2015-04-04: 2 mg via INTRAVENOUS
  Filled 2015-04-04: qty 1

## 2015-04-04 MED ORDER — POTASSIUM CHLORIDE CRYS ER 20 MEQ PO TBCR
40.0000 meq | EXTENDED_RELEASE_TABLET | Freq: Once | ORAL | Status: AC
  Administered 2015-04-04: 40 meq via ORAL

## 2015-04-04 MED ORDER — MORPHINE SULFATE (CONCENTRATE) 10 MG/0.5ML PO SOLN
2.5000 mg | ORAL | Status: DC | PRN
Start: 2015-04-04 — End: 2015-04-07

## 2015-04-04 MED ORDER — ALBUTEROL SULFATE (2.5 MG/3ML) 0.083% IN NEBU
2.5000 mg | INHALATION_SOLUTION | Freq: Two times a day (BID) | RESPIRATORY_TRACT | Status: DC
  Administered 2015-04-04 – 2015-04-07 (×6): 2.5 mg via RESPIRATORY_TRACT
  Filled 2015-04-04 (×6): qty 3

## 2015-04-04 NOTE — Progress Notes (Signed)
NIF -20 and VC 

## 2015-04-04 NOTE — Progress Notes (Signed)
Subjective: Patient had no new complaints. Currently wearing BiPAP and is still intermittently requiring BiPAP because of breathing difficulty.   Most recent NIF -20 and VC 425ml  Objective: Current vital signs: BP 155/77 mmHg  Pulse 86  Temp(Src) 98.1 F (36.7 C) (Axillary)  Resp 23  Ht 4\' 8"  (1.422 m)  Wt 56.2 kg (123 lb 14.4 oz)  BMI 27.79 kg/m2  SpO2 100%  Neurologic Exam: Patient was alert and in no acute distress. Mental status was normal. Extraocular movements were full and conjugate with no diplopia in any field of gaze. Facial strength was normal.  Speech was much improved, particularly phonation. Strength of upper extremities was normal except for minimal left deltoid weakness. Hip flexors strength was normal bilaterally, as well as distal lower extremity strength.  Medications: I have reviewed the patient's current medications.  Assessment/Plan: 81 year old lady with myasthenia gravis who presented with crisis, requiring plasma exchange following failure of IVIG course of treatment. She improved with plasma exchange which to be discontinued because of allergic reactions, presumably due to citrate buffer. Currently stable  Recommend no changes in current management. We will continue to follow as well.  Elspeth Choeter Courtez Twaddle, DO [facility] [phone]  If 7pm- 7am, please page neurology on call as listed in AMION.   04/04/2015  7:35 AM

## 2015-04-04 NOTE — Progress Notes (Signed)
[place] TEAM 1 - Stepdown/ICU TEAM Progress Note  Cheyenne Boone SNK:[id number] DOB: 1924-05-02 DOA: 03/15/2015 PCP: Tracie Harrier, MD  Admit HPI / Brief Narrative: 81 year old WF PMHx 10 day history of dysphagia. Was admitted to [facility] for what was suspected to be myasthenia gravis flare. She was started on on IVIG and mestinon, however eventually required intubation. Transferred to [facility] at family request for possible Plasma Exchange (PLEX).  HPI/Subjective: 10/23, sleepy,  A/O 1 (patient did not know where, when, why), continued positive waxing and waning SOB requiring Ventimask/BiPAP, negative CP.   Assessment/Plan: Acute respiratory failure in setting myasthenia gravis flare -BiPAP PRN;consider comfort measures and discontinuation of BiPAP if patient does not improve In next 48 hours. See goals of care below -8/23 Vital capacity (VC) q shift= 425 ml -8/23 Negative inspiratory force (NIF) q shift= -20 -Patient NIF borderline for intubation except patient DO NOT RESUSCITATE. Patient only able to remain off on Ventimask/BiPAP for short periods without becoming extremely tired secondary to WOB. -Palliative Care; met with [name] (daughter)  and NP [name] (palliative care). It was agreed that we would allow additional 48 hours of current treatment and if no improvement hospice care.  -[name] would like patient transported to [facility].   Respiratory alkalosis noncompensated -Continue to treat underlying issue myasthenia gravis -Ativan 0.5 mg TID for anxiety  Myasthenia gravis crisis (Acety choline binding ab positive 9/28) -Neurology following; completed 4 rounds of PLEX  -Neurology increased Mestinon to 60 mg  q 6hr -Continue prednisone 10 mg daily  -Spoke with [facility] neuro hospitalist and he concurs that if no significant improvement in the next 48 hours then would be consider treatment failure.  Allergy to citrate w/ tongue swelling -5th  PLEX tx cancelled as pt no longer intubated   Agitation -Ativan 0.5 mg TID   Mucus plugging -S/P bronch & BAL 9/29  Aspiration PNA - H parahaemolytics on BAL -Has completed a course of abx tx  -Flutter valve -Neurology continueing prednisone 10 mg daily  -Physiotherapy vest BID -DuoNeb QID  Acute Renal Failure  -Resolved  Normocytic Anemia -Stable   Large hiatal hernia  Dysphagia -Cleared for D1 nectar diet per SLP   Hyperglycemia -CBG well controlled   Hypokalemia -Supplement as needed to keep at ~4.0 -K-Dur 40 mEq 1  Hypomag -Magnesium at goal  Severe malnutrition in context of chronic illness   Goals of care -10/2 NP [name] (palliative care) and I spoke at length with [name] daughter [phone], and counseled her that if mother's condition did not improve within the next 48 hours it would not improve.  -[name] expressed interest in bringing mother down to [place] for hospice care if no improvement.  -NOTE; per NP [name] (palliative care), after I left meeting [name] expressed that family members as well as her felt that her sister may have poisoned mother causing the myasthenia gravis or myasthenia gravis type symptoms. She would like to have the medical examiner perform autopsy at time of death but keep findings from [place] (sister). In addition would like to pursue any tests now which would confirm her suspicion.   Code Status: DO NOT RESUSCITATE Family Communication: None present at time of exam Disposition Plan: Per neurology   Consultants: Dr.Daniel Lily Kocher PCCM  [facility]  NP [name] (palliative care).   Procedure/Significant Events: 9/27 - Admit for dysphagia  9/29 - Transfer to ICU @ [facility] for respiratory failure &  intubated>>Bronchoscopy with bilateral secretions & plugging 10/01 - Extubated 10/03 - Reintubated 10/03 - Transfer to [facility] from [facility] 10/4 - HD cath  placed 10/5 PLEX planned, EGD for OGT placement. PLEX stopped due to hypotension 10/7 - concern for allergic rxn to UNasyn or plex citrate, improved with treatment 10/8 - delirium severe--> precedex-->PEA short arrest--> woke up 10/13 - Extubated    Culture   Antibiotics: Unasyn 10/04>>10/7 Zosyn 9/29>>10/04 Vancomycin 9/29 >>10/04 Meropenem 10/7>>10/12  DVT prophylaxis: lovenox   Devices    LINES / TUBES:      Continuous Infusions: . sodium chloride 20 mL/hr at 04/03/15 2000    Objective: VITAL SIGNS: Temp: 97.1 F (36.2 C) (10/23 0800) Temp Source: Axillary (10/23 0800) BP: 155/77 mmHg (10/23 0400) Pulse Rate: 86 (10/23 0400) SPO2; FIO2:   Intake/Output Summary (Last 24 hours) at 04/04/15 0845 Last data filed at 04/03/15 2000  Gross per 24 hour  Intake    300 ml  Output      0 ml  Net    300 ml     Exam: General: Sleepy, very difficult to arouse  A/O 1 (patient did not know where, when, why), NAD,negative respiratory distress (requiring O2 support/BiPAP) Eyes: Negative headache, eye pain, double vision,negative scleral hemorrhage ENT: Negative Runny nose, negative ear pain, negative gingival bleeding, Neck:  Negative scars, masses, torticollis, lymphadenopathy, JVD Lungs: poor air movement,positive expiratory  wheezing, negative crackles,Patient using accessory muscles to breathe.  Cardiovascular: Regular rhythm and rate  without murmur gallop or rub normal S1 and S2 Abdomen:negative abdominal pain, nondistended, positive soft, bowel sounds, no rebound, no ascites, no appreciable mass Extremities: No significant cyanosis, clubbing, or edema bilateral lower extremities Psychiatric:  Negative depression, negative anxiety, negative fatigue, negative mania  Neurologic:  Cranial nerves II through XII intact, tongue/uvula midline, all extremities muscle strength 5/5, sensation intact throughout, negative dysarthria, negative expressive aphasia, negative  receptive aphasia.   Data Reviewed: Basic Metabolic Panel:  Recent Labs Lab 03/31/15 0500 04/01/15 0415 04/02/15 0435 04/03/15 0515 04/04/15 0500  NA 143 145 142 141 140  K 3.2* 4.0 3.6 3.6 3.5  CL 107 113* 110 108 108  CO2 27 27 26 28 28   GLUCOSE 120* 108* 118* 97 96  BUN 22* 17 16 17 16   CREATININE 0.77 0.75 0.78 0.72 0.73  CALCIUM 9.0 9.0 8.9 8.4* 8.5*  MG 2.0 2.1 2.0 2.0 1.8   Liver Function Tests:  Recent Labs Lab 03/31/15 0500 04/01/15 0415 04/02/15 0435 04/03/15 0515 04/04/15 0500  AST 16 15 15 17 18   ALT 14 12* 12* 12* 14  ALKPHOS 109 108 119 108 119  BILITOT 0.9 0.9 0.5 0.6 0.6  PROT 5.5* 5.4* 5.5* 4.8* 5.1*  ALBUMIN 2.8* 2.7* 2.8* 2.5* 2.6*   No results for input(s): LIPASE, AMYLASE in the last 168 hours. No results for input(s): AMMONIA in the last 168 hours. CBC:  Recent Labs Lab 03/31/15 0500 04/01/15 0415 04/02/15 0435 04/03/15 0515 04/04/15 0500  WBC 7.4 6.9 8.4 6.2 7.4  NEUTROABS 5.9 5.0 6.6 4.5 5.4  HGB 8.4* 8.2* 8.4* 7.2* 7.8*  HCT 26.2* 26.6* 27.1* 22.7* 25.0*  MCV 97.0 97.8 97.5 97.4 96.2  PLT 402* 430* 424* 359 379   Cardiac Enzymes: No results for input(s): CKTOTAL, CKMB, CKMBINDEX, TROPONINI in the last 168 hours. BNP (last 3 results)  Recent Labs  03/15/15 2246  BNP 314.2*    ProBNP (last 3 results) No results for input(s): PROBNP in the last 8760  hours.  CBG:  Recent Labs Lab 03/28/15 1956 03/28/15 2304 03/29/15 0357 03/29/15 0800 03/29/15 1216  GLUCAP 103* 109* 99 104* 91    No results found for this or any previous visit (from the past 240 hour(s)).   Studies:  Recent x-ray studies have been reviewed in detail by the Attending Physician  Scheduled Meds:  Scheduled Meds: . antiseptic oral rinse  7 mL Mouth Rinse q12n4p  . chlorhexidine  15 mL Mouth Rinse BID  . enoxaparin (LOVENOX) injection  40 mg Subcutaneous Q24H  . feeding supplement (ENSURE ENLIVE)  237 mL Oral BID BM  . hydroxypropyl  methylcellulose / hypromellose  1 drop Both Eyes TID  . ipratropium-albuterol  3 mL Nebulization QID  . latanoprost  1 drop Right Eye QHS  . LORazepam  0.5 mg Intravenous TID  . polyethylene glycol  17 g Oral Daily  . predniSONE  10 mg Oral Q1200  . pyridostigmine  60 mg Oral 4 times per day    Time spent on care of this patient: 40 mins   WOODS, Geraldo Docker , MD  [facility] Office  [phone] Pager - [phone]  On-Call/Text Page:      [email or web address]      password [email or web address]  If 7PM-7AM, please contact night-coverage [email or web address] Password [email or web address] 04/04/2015, 8:45 AM   LOS: 20 days   Care during the described time interval was provided by me .  I have reviewed this patient's available data, including medical history, events of note, physical examination, and all test results as part of my evaluation. I have personally reviewed and interpreted all radiology studies.   Dia Crawford, MD [phone] Pager

## 2015-04-04 NOTE — Progress Notes (Signed)
Pt. Will not wake up to do NIF & VC at this time. RN aware.

## 2015-04-04 NOTE — Progress Notes (Signed)
Family mtg held with daughter Cheyenne Boone. Per Mrs. Cheyenne Boone her sister Cheyenne Boone was invited but could not make meeting. Plan is to continue to monitor for improvement for the next 48 hours including BIPAP but with the caveat to try and minimize usage as daughter is prepared that we will have to stop this soon. Plan more than likely for transfer to [place]-pt hospice [place] in [place] or [place]. Dr. Joseph Boone attended mtg. Full note to follow Thank you, Cheyenne RouxSarah Henslee Boone, ANP

## 2015-04-05 LAB — CBC WITH DIFFERENTIAL/PLATELET
BASOS ABS: 0 10*3/uL (ref 0.0–0.1)
Basophils Relative: 0 %
EOS PCT: 4 %
Eosinophils Absolute: 0.2 10*3/uL (ref 0.0–0.7)
HCT: 23.6 % — ABNORMAL LOW (ref 36.0–46.0)
HEMOGLOBIN: 7.5 g/dL — AB (ref 12.0–15.0)
LYMPHS ABS: 1 10*3/uL (ref 0.7–4.0)
LYMPHS PCT: 16 %
MCH: 30.9 pg (ref 26.0–34.0)
MCHC: 31.8 g/dL (ref 30.0–36.0)
MCV: 97.1 fL (ref 78.0–100.0)
Monocytes Absolute: 0.7 10*3/uL (ref 0.1–1.0)
Monocytes Relative: 12 %
NEUTROS PCT: 68 %
Neutro Abs: 4.1 10*3/uL (ref 1.7–7.7)
PLATELETS: 356 10*3/uL (ref 150–400)
RBC: 2.43 MIL/uL — AB (ref 3.87–5.11)
RDW: 15.3 % (ref 11.5–15.5)
WBC: 6.1 10*3/uL (ref 4.0–10.5)

## 2015-04-05 LAB — MAGNESIUM: MAGNESIUM: 1.8 mg/dL (ref 1.7–2.4)
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
## 2015-04-05 NOTE — Care Management Important Message (Signed)
Important Message  Patient Details  Name: Cheyenne GoldKatherine M Stimmel MRN: [id number] Date of Birth: 05/28/1924   Medicare Important Message Given:  Yes-second notification given    Kyla BalzarineShealy, Naftuli Dalsanto Abena 04/05/2015, 10:34 AM

## 2015-04-05 NOTE — Progress Notes (Signed)
Pt. Is unable to do NIF & VC at this time.

## 2015-04-05 NOTE — Clinical Social Work Note (Signed)
Clinical Social Worker continuing to follow patient and family for support and discharge planning needs.  Patient family met with Palliative Care Services yesterday and would like to continue with care for 48 hours to see any improvements.  Patient family did discuss possibility of residential hospice [facility] and/or [place]).  Patient continues to have a bed available at [facility] if needed at discharge.  CSW remains available for support and to facilitate patient discharge needs once medically stable.  Barbette Or, [place]

## 2015-04-05 NOTE — Progress Notes (Signed)
UR COMPLETED  

## 2015-04-05 NOTE — Progress Notes (Signed)
Speech Language Pathology Treatment: Dysphagia  Patient Details Name: Cheyenne Boone MRN: [id number] DOB: 03/07/1924 Today's Date: 04/05/2015 Time: [id number] SLP Time Calculation (min) (ACUTE ONLY): 15 min  Assessment / Plan / Recommendation Clinical Impression  Pt seen with am meal. Initiated session with trials of ice chips and one sips of liquid to confirm pts ability to sense aspiration. Pt tolerated ice chips well, but had immediate hard cough with with water. Continued session alternating puree and teaspoon of honey with intermittent cues to clear throat and swallow twice if audible wet vocal quality observed. Pt followed these cued well and consumed 50% of breakfast without overt fatigue or change in vital signs. Suspect ongoing neuromuscular weakness leading to delayed swallow trigger and residuals that clear with aforementioned cues. Discussed with RN, will continue efforts.    HPI Other Pertinent Information: 81 year old female with 10 day history of dysphagia. Was [facility] for what was suspected to be myasthenia gravis flare. She was started on on IVIG and mestinon, however eventually required intubation (9/29-10/01, reintubated 03-14-09/13). Pt had barium swallow 9/27 which did not show aspiration of small sips of thin liquids, although pt did not consume larger quantities (making study limited).    Pertinent Vitals Pain Assessment: No/denies pain  SLP Plan  Continue with current plan of care    Recommendations Diet recommendations: Dysphagia 1 (puree);Honey-thick liquid Liquids provided via: Teaspoon Medication Administration: Crushed with puree Supervision: Full supervision/cueing for compensatory strategies;Trained caregiver to feed patient;Staff to assist with self feeding Compensations: Slow rate;Small sips/bites;Follow solids with liquid;Externally pace;Clear throat intermittently Postural Changes and/or Swallow Maneuvers: Seated upright 90 degrees               Follow up Recommendations: Skilled Nursing facility;24 hour supervision/assistance Plan: Continue with current plan of care    GO    [facility] Medical CenterBonnie Kirsten Spearing, MA CCC-SLP [phone]  [name], Queena Monrreal Caroline 04/05/2015, 11:12 AM

## 2015-04-05 NOTE — Progress Notes (Signed)
Pt is unable to do NIF/VC at this time. She is wear BIPAP mask.

## 2015-04-05 NOTE — Progress Notes (Signed)
[facility] TEAM 1 - Stepdown/ICU TEAM PROGRESS NOTE  Cheyenne GoldKatherine M Boone VWU:[id number] DOB: 07/21/1923 DOA: 03/15/2015 PCP: Barbette ReichmannHANDE,VISHWANATH, MD  Admit HPI / Brief Narrative: 81 year old female with 10 day history of dysphagia. [facility] for what was suspected to be myasthenia gravis flare. She was started on on IVIG and mestinon, however eventually required intubation. Transferred to [facility] at family request for possible PLEX.  Significant Events: 9/27 - Admit for dysphagia  9/29 - [facility] for respiratory failure & intubated>>Bronchoscopy with bilateral secretions & plugging 10/01 - Extubated 10/03 - Reintubated 10/03 - [facility] 10/4 - HD cath placed 10/5 PLEX planned, EGD for OGT placement. PLEX stopped due to hypotension 10/7 - concern for allergic rxn to UNasyn or plex citrate, improved with treatment 10/8 - delirium severe--> precedex-->PEA short arrest--> woke up 10/13 - Extubated  HPI/Subjective: Patient is off BiPAP on nasal cannula at the time of visit.  She is alert and conversant though mildly confused.  She does not appear to be uncomfortable.  She denies shortness of breath chest pain nausea vomiting or abdominal pain.  Assessment/Plan:  Acute respiratory failure in setting myasthenia gravis flare Appears to be making some progress at this time though any progress is certainly guarded - continue to follow and attempt to keep off of BiPAP as long as possible  Myasthenia gravis crisis Neurology following - completed 4 rounds of PLEX - mestinon continues - not felt to be safe for return to PLEX in absence of secured airway given hx of tongue swelling w/ citrate - no further treatment options beyond what she is currently being afforded  Allergy to citrate w/ tongue swelling 5th PLEX tx cancelled as pt no longer intubated   Agitation Appears to have resolved  Mucus plugging S/P bronch & BAL 9/29  Aspiration PNA - H  parahaemolytics on BAL Has completed a course of abx tx   Acute Renal Failure  Resolved  Normocytic Anemia No evidence of acute blood loss - hemoglobin waxing and waning - follow trend  Large hiatal hernia  Dysphagia Cleared for D1 honey diet per SLP  Hyperglycemia CBG remains well controlled   Hypokalemia Supplement as needed to keep at ~4.0  Hypomag Magnesium at goal  Severe malnutrition in context of chronic illness  Code Status: NO CODE - DNR Family Communication: no family present at time of exam Disposition Plan: SDU   Consultants: PCCM  Neuro   Antibiotics: Unasyn 10/04>10/7 Zosyn 9/29>10/04 Vancomycin 9/29 >10/04 Meropenem 10/7>10/12  DVT prophylaxis: lovenox  Objective: Blood pressure 123/65, pulse 79, temperature 97.6 F (36.4 C), temperature source Oral, resp. rate 17, height 4\' 8"  (1.422 m), weight 57.2 kg (126 lb 1.7 oz), SpO2 99 %.  Intake/Output Summary (Last 24 hours) at 04/05/15 1616 Last data filed at 04/05/15 0700  Gross per 24 hour  Intake    940 ml  Output      0 ml  Net    940 ml   Exam: General: Appears comfortable on nasal cannula only  Lungs: Clear to auscultation bilaterally without wheezes or crackles  Cardiovascular: Regular rate and rhythm  Abdomen: Nontender, nondistended, soft, bowel sounds positive, no rebound, no ascites, no appreciable mass Extremities: No significant cyanosis, clubbing, edema bilateral lower extremities  Data Reviewed: Basic Metabolic Panel:  Recent Labs Lab 03/31/15 0500 04/01/15 0415 04/02/15 0435 04/03/15 0515 04/04/15 0500 04/05/15 0430  NA 143 145 142 141 140  --  K 3.2* 4.0 3.6 3.6 3.5  --   CL 107 113* 110 108 108  --   CO2 --   GLUCOSE 120* 108* 118* 97 96  --   BUN 22* --   CREATININE 0.77 0.75 0.78 0.72 0.73  --   CALCIUM 9.0 9.0 8.9 8.4* 8.5*  --   MG 2.0 2.1 2.0 2.0 1.8 1.8    CBC:  Recent Labs Lab 04/01/15 0415 04/02/15 0435  04/03/15 0515 04/04/15 0500 04/05/15 0430  WBC 6.9 8.4 6.2 7.4 6.1  NEUTROABS 5.0 6.6 4.5 5.4 4.1  HGB 8.2* 8.4* 7.2* 7.8* 7.5*  HCT 26.6* 27.1* 22.7* 25.0* 23.6*  MCV 97.8 97.5 97.4 96.2 97.1  PLT 430* 424* 359 379 356    Liver Function Tests:  Recent Labs Lab 03/31/15 0500 04/01/15 0415 04/02/15 0435 04/03/15 0515 04/04/15 0500  AST ALT 14 12* 12* 12* 14  ALKPHOS 109 108 119 108 119  BILITOT 0.9 0.9 0.5 0.6 0.6  PROT 5.5* 5.4* 5.5* 4.8* 5.1*  ALBUMIN 2.8* 2.7* 2.8* 2.5* 2.6*    Studies:   Recent x-ray studies have been reviewed in detail by the Attending Physician  Scheduled Meds:  Scheduled Meds: . albuterol  2.5 mg Nebulization BID  . antiseptic oral rinse  7 mL Mouth Rinse q12n4p  . chlorhexidine  15 mL Mouth Rinse BID  . enoxaparin (LOVENOX) injection  40 mg Subcutaneous Q24H  . feeding supplement (ENSURE ENLIVE)  237 mL Oral BID BM  . hydroxypropyl methylcellulose / hypromellose  1 drop Both Eyes TID  . latanoprost  1 drop Right Eye QHS  . LORazepam  0.5 mg Intravenous TID  . polyethylene glycol  17 g Oral Daily  . predniSONE  10 mg Oral Q1200  . pyridostigmine  60 mg Oral 4 times per day    Time spent on care of this patient: 25 mins   [facility] T , MD   [facility] Office  [phone] Pager - Text Page per Loretha Stapler as per below:  On-Call/Text Page:      [email or web address]      password [email or web address]  If 7PM-7AM, please contact night-coverage [email or web address] Password [email or web address] 04/05/2015, 4:16 PM   LOS: 21 days

## 2015-04-05 NOTE — Progress Notes (Addendum)
Daily Progress Note   Patient Name: Cheyenne Boone       Date: 04/05/2015 DOB: December 10, 1923  Age: 81 y.o. MRN#: [id number] Attending Physician: Cherene Altes, MD Primary Care Physician: Tracie Harrier, MD Admit Date: 03/15/2015  Reason for Consultation/Follow-up: Establishing goals of care  Subjective: Cheyenne Boone is lying in bed. I have not met with her previously but she is today alert and pleasant. She can tell me name, birthdate/age, she is at [facility], and month/year however is confused to situation. Breakfast tray at bedside and appears to have ate ~50%. Per RN did struggle with breakfast and aspiration. She seemed to have progressed some today although I did not see her previously. Her main barrier is her continued need for BiPAP as well as aspiration risk. I did speak with her daughter Cheyenne Boone who says that she has picked out [facility] in [place] for her mother and that her sister Cheyenne Boone has agreed (I have not spoken to sister Cheyenne Boone).   Interval Events: 10/23: Decided to give another 48 hrs and then likely placement in hospice facility.   Length of Stay: 21 days  Current Medications: Scheduled Meds:  . albuterol  2.5 mg Nebulization BID  . antiseptic oral rinse  7 mL Mouth Rinse q12n4p  . chlorhexidine  15 mL Mouth Rinse BID  . enoxaparin (LOVENOX) injection  40 mg Subcutaneous Q24H  . feeding supplement (ENSURE ENLIVE)  237 mL Oral BID BM  . hydroxypropyl methylcellulose / hypromellose  1 drop Both Eyes TID  . latanoprost  1 drop Right Eye QHS  . LORazepam  0.5 mg Intravenous TID  . polyethylene glycol  17 g Oral Daily  . predniSONE  10 mg Oral Q1200  . pyridostigmine  60 mg Oral 4 times per day    Continuous Infusions: . sodium chloride 20 mL/hr  at 04/03/15 2000    PRN Meds: albuterol, camphor-menthol, diphenhydrAMINE, haloperidol lactate, hydrALAZINE, morphine injection, morphine CONCENTRATE, ondansetron (ZOFRAN) IV, RESOURCE THICKENUP CLEAR  Physical Exam: Physical Exam  Constitutional: She appears well-developed.  HENT:  Temporal muscle wasting  Cardiovascular: Normal rate and regular rhythm.   Pulmonary/Chest: Effort normal. She has rales.  Abdominal: Soft. She exhibits no distension. There is no tenderness.  Musculoskeletal: Normal range of motion.  Neurological: She is alert. She is  disoriented.  Psychiatric: She has a normal mood and affect.                Vital Signs: BP 123/65 mmHg  Pulse 79  Temp(Src) 97.4 F (36.3 C) (Axillary)  Resp 17  Ht _0  (1.422 m)  Wt 57.2 kg (126 lb 1.7 oz)  BMI 28.29 kg/m2  SpO2 99% SpO2: SpO2: 99 % O2 Device: O2 Device: Nasal Cannula O2 Flow Rate: O2 Flow Rate (L/min): 4 L/min  Intake/output summary:  Intake/Output Summary (Last 24 hours) at 04/05/15 1018 Last data filed at 04/05/15 0700  Gross per 24 hour  Intake    940 ml  Output      0 ml  Net    940 ml   Baseline Weight: Weight: 49.1 kg (108 lb 3.9 oz) Most recent weight: Weight: 57.2 kg (126 lb 1.7 oz)       Palliative Assessment/Data: Flowsheet Rows        Most Recent Value   Intake Tab    Referral Department  Hospitalist   Unit at Time of Referral  Intermediate Care Unit   Palliative Care Primary Diagnosis  Neurology   Date Notified  04/02/15   Palliative Care Type  New Palliative care   Reason for referral  Clarify Goals of Care, Non-pain Symptom   Date of Admission  03/15/15   Date first seen by Palliative Care  04/03/15   # of days Palliative referral response time  1 Day(s)   # of days IP prior to Palliative referral  18   Clinical Assessment    Palliative Performance Scale Score  20%   Pain Max last 24 hours  Not able to report   Pain Min Last 24 hours  Not able to report   Dyspnea Max Last 24  Hours  Not able to report   Dyspnea Min Last 24 hours  Not able to report   Nausea Max Last 24 Hours  Not able to report   Nausea Min Last 24 Hours  Not able to report   Anxiety Max Last 24 Hours  Not able to report   Anxiety Min Last 24 Hours  Not able to report   Other Max Last 24 Hours  Not able to report   Psychosocial & Spiritual Assessment    Social Work Plan of Care  Clarified patient/family wishes with healthcare team   Palliative Care Outcomes    Patient/Family meeting held?  Yes   Who was at the meeting?  Daughter [name]   Palliative Care Outcomes  Improved non-pain symptom therapy, Counseled regarding hospice, Changed to focus on comfort, Provided psychosocial or spiritual support, Clarified goals of care   Patient/Family wishes: Interventions discontinued/not started   Mechanical Ventilation, BiPAP, Vasopressors, PEG   Palliative Care follow-up planned  No   Other Treatment Preference Instructions  monitoring for the next 48 hours for imrpvoement then likely comfort care      Additional Data Reviewed: Recent Labs     04/03/15  0515  04/04/15  0500  04/05/15  0430  WBC  6.2  7.4  6.1  HGB  7.2*  7.8*  7.5*  PLT  359  379  356  NA  141  140   --   BUN  17  16   --   CREATININE  0.72  0.73   --      Problem List:  Patient Active Problem List   Diagnosis Date Noted  .  Palliative care encounter   . Hypokalemia   . Hypomagnesemia   . Hyperglycemia   . Aspiration pneumonia due to vomit ([place])   . Respiratory alkalosis   . Myasthenia gravis ([place])   . Encounter for feeding tube placement   . Pneumonia   . Acute respiratory failure ([place])   . Protein-calorie malnutrition, severe 03/16/2015  . Neuromuscular weakness ([place])   . Acute renal failure ([place])   . Aspiration pneumonia ([place])   . Encounter for central line placement   . Acute respiratory failure with hypoxemia ([place]) 03/15/2015  . Dyspnea   . Respiratory failure ([place])   . Acute respiratory failure  with hypoxia ([place])   . Dysphagia 03/09/2015  . Hip fracture ([place]) 10/31/2014     Palliative Care Assessment & Plan    1.Code Status:  DNR    Code Status Orders        Start     Ordered   03/25/15 4114  Do not attempt resuscitation (DNR)   Continuous    Question Answer Comment  In the event of cardiac or respiratory ARREST Do not call a "code blue"   In the event of cardiac or respiratory ARREST Do not perform Intubation, CPR, defibrillation or ACLS   In the event of cardiac or respiratory ARREST Use medication by any route, position, wound care, and other measures to relive pain and suffering. May use oxygen, suction and manual treatment of airway obstruction as needed for comfort.      03/25/15 1049       2. Goals of Care:  Continue supportive care and BiPAP for now (although please limit and use as last resort).   Desire for further Chaplaincy support:no  Psycho-social Needs: Caregiving  Support/Resources  3. Symptom Management:      Anxiety: Continue ativan as ordered.  Dyspnea: Utilize morphine prn prior to utilizing BiPAP.    4. Palliative Prophylaxis:   Bowel Regimen, Delirium Protocol, Oral Care and Turn Reposition  5. Prognosis: < 2 weeks - if still requiring BiPAP and with aspiration.   6. Discharge Planning:  Hospice facility   Thank you for allowing the Palliative Medicine Team to assist in the care of this patient.   Time In: 0940 Time Out: 1020 Total Time 58mn Prolonged Time Billed  no        APershing Proud NP  164/31/4276 10:18 AM  Please contact Palliative Medicine Team phone at [phone] questions and concerns.

## 2015-04-06 DIAGNOSIS — R1310 Dysphagia, unspecified: Secondary | ICD-10-CM

## 2015-04-06 LAB — HEMOGLOBIN AND HEMATOCRIT, BLOOD
HCT: 27.4 % — ABNORMAL LOW (ref 36.0–46.0)
Hemoglobin: 8.5 g/dL — ABNORMAL LOW (ref 12.0–15.0)

## 2015-04-06 LAB — GLUCOSE, CAPILLARY: GLUCOSE-CAPILLARY: 96 mg/dL (ref 65–99)

## 2015-04-06 MED ORDER — GLYCOPYRROLATE 0.2 MG/ML IJ SOLN
0.2000 mg | INTRAMUSCULAR | Status: DC | PRN
  Administered 2015-04-07: 0.2 mg via INTRAVENOUS
  Filled 2015-04-06 (×2): qty 1

## 2015-04-06 NOTE — Progress Notes (Signed)
Patient off BIPAP at this time. Resting comfortably on 2 lpm [place]. No distress noted.

## 2015-04-06 NOTE — Progress Notes (Signed)
[place] TEAM 1 - Stepdown/ICU TEAM Progress Note  Cheyenne Boone IHW:[id number] DOB: 21-Feb-1924 DOA: 03/15/2015 PCP: Tracie Harrier, MD  Admit HPI / Brief Narrative: 81 year old WF PMHx 10 day history of dysphagia. Was admitted to [facility] for what was suspected to be myasthenia gravis flare. She was started on on IVIG and mestinon, however eventually required intubation. Transferred to [facility] at family request for possible Plasma Exchange (PLEX).  HPI/Subjective: 10/25,  A/O 3 (patient did not know where,). Although patient new year, why she was here Patient's condition has deteriorated to the point she was unable to perform NIF, VC this morning with respiratory therapy. Continued positive waxing and waning SOB requiring Ventimask/BiPAP, negative CP.   Assessment/Plan: Acute respiratory failure in setting myasthenia gravis flare -BiPAP PRN; patient has not improved in the last 48 hours. Had discussed with family that if this occurred she would be considered a treatment failure. All treatment options have been exhausted. -8/25 Vital capacity (VC) q shift= unable to obtain secondary to increased weakness  -8/25 Negative inspiratory force (NIF) q shift= unable to obtain secondary to increased weakness  -10/23 Palliative Care; met with [name] (daughter)  and NP [name] (palliative care). It was agreed that we would allow additional 48 hours of current treatment and if no improvement hospice care.  -10/25 spoke with palliative care NP [name] will begin proceedings to get patient transferred to hospice.   Respiratory alkalosis noncompensated -Continue to treat underlying issue myasthenia gravis -Ativan 0.5 mg TID for anxiety  Myasthenia gravis crisis (Acety choline binding ab positive 9/28) -Neurology following; completed 4 rounds of PLEX  -Neurology increased Mestinon to 60 mg  q 6hr -Continue prednisone 10 mg daily  -No improvement has taken place within the last 48  hours per [facility] neuro hospitalist guidance. Family understands patient is now considered treatment failure -Hospice  Allergy to citrate w/ tongue swelling -5th PLEX tx cancelled as pt no longer intubated   Agitation -Ativan 0.5 mg TID   Mucus plugging -S/P bronch & BAL 9/29  Aspiration PNA - H parahaemolytics on BAL -Has completed a course of abx tx  -Flutter valve -Neurology continueing prednisone 10 mg daily  -Physiotherapy vest BID -DuoNeb QID  Acute Renal Failure  -Resolved  Normocytic Anemia -Stable   Large hiatal hernia  Dysphagia -Cleared for D1 nectar diet per SLP   Hyperglycemia -CBG well controlled   Hypokalemia -Supplement as needed to keep at ~4.0  Hypomagnesemia -Magnesium at goal  Severe malnutrition in context of chronic illness   Goals of care -10/2 NP [name] (palliative care) and I spoke at length with [name] daughter [phone], and counseled her that if mother's condition did not improve within the next 48 hours it would not improve.  -[name] expressed interest in bringing mother down to [place] for hospice care if no improvement.  -NOTE; per NP [name] (palliative care), after I left meeting [name] expressed that family members as well as her felt that her sister may have poisoned mother causing the myasthenia gravis or myasthenia gravis type symptoms. She would like to have the medical examiner perform autopsy at time of death but keep findings from [place] (sister). In addition would like to pursue any tests now which would confirm her suspicion. -NP [name] (palliative care) working on hospice near [place]   Code Status: DO NOT RESUSCITATE Family Communication: None present at time of exam Disposition Plan: Hospice   Consultants: Dr.Daniel Lenna Sciara  [facility]  NP [name] (palliative care).   Procedure/Significant Events: 9/27 - Admit for dysphagia   9/29 - Transfer to ICU @ [facility] for respiratory failure & intubated>>Bronchoscopy with bilateral secretions & plugging 10/01 - Extubated 10/03 - Reintubated 10/03 - Transfer to [facility] 10/4 - HD cath placed 10/5 PLEX planned, EGD for OGT placement. PLEX stopped due to hypotension 10/7 - concern for allergic rxn to UNasyn or plex citrate, improved with treatment 10/8 - delirium severe--> precedex-->PEA short arrest--> woke up 10/13 - Extubated    Culture   Antibiotics: Unasyn 10/04>>10/7 Zosyn 9/29>>10/04 Vancomycin 9/29 >>10/04 Meropenem 10/7>>10/12  DVT prophylaxis: lovenox   Devices    LINES / TUBES:      Continuous Infusions:    Objective: VITAL SIGNS: Temp: 97.7 F (36.5 C) (10/25 0349) Temp Source: Oral (10/25 0349) BP: 122/55 mmHg (10/25 0650) Pulse Rate: 100 (10/25 0650) SPO2; FIO2:   Intake/Output Summary (Last 24 hours) at 04/06/15 7588 Last data filed at 04/05/15 2000  Gross per 24 hour  Intake      0 ml  Output      0 ml  Net      0 ml     Exam: General: A/O 3 (patient did not know where,), NAD,negative respiratory distress (requiring O2 support/BiPAP) Eyes: Negative headache, eye pain, double vision,negative scleral hemorrhage ENT: Negative Runny nose, negative ear pain, negative gingival bleeding, Neck:  Negative scars, masses, torticollis, lymphadenopathy, JVD Lungs: poor air movement, positive rhonchi LUL/lingula, negative wheezing, negative crackles. Cardiovascular: Regular rhythm and rate  without murmur gallop or rub normal S1 and S2 Abdomen:negative abdominal pain, nondistended, positive soft, bowel sounds, no rebound, no ascites, no appreciable mass Extremities: No significant cyanosis, clubbing, or edema bilateral lower extremities Psychiatric:  Negative depression, negative anxiety, negative fatigue, negative mania  Neurologic:  Cranial nerves II through XII intact, tongue/uvula midline, all extremities muscle  strength 5/5, sensation intact throughout, negative dysarthria, negative expressive aphasia, negative receptive aphasia.   Data Reviewed: Basic Metabolic Panel:  Recent Labs Lab 03/31/15 0500 04/01/15 0415 04/02/15 0435 04/03/15 0515 04/04/15 0500 04/05/15 0430  NA 143 145 142 141 140  --   K 3.2* 4.0 3.6 3.6 3.5  --   CL 107 113* 110 108 108  --   CO2 _0 --   GLUCOSE 120* 108* 118* 97 96  --   BUN 22* _1 --   CREATININE 0.77 0.75 0.78 0.72 0.73  --   CALCIUM 9.0 9.0 8.9 8.4* 8.5*  --   MG 2.0 2.1 2.0 2.0 1.8 1.8   Liver Function Tests:  Recent Labs Lab 03/31/15 0500 04/01/15 0415 04/02/15 0435 04/03/15 0515 04/04/15 0500  AST _2 ALT 14 12* 12* 12* 14  ALKPHOS 109 108 119 108 119  BILITOT 0.9 0.9 0.5 0.6 0.6  PROT 5.5* 5.4* 5.5* 4.8* 5.1*  ALBUMIN 2.8* 2.7* 2.8* 2.5* 2.6*   No results for input(s): LIPASE, AMYLASE in the last 168 hours. No results for input(s): AMMONIA in the last 168 hours. CBC:  Recent Labs Lab 04/01/15 0415 04/02/15 0435 04/03/15 0515 04/04/15 0500 04/05/15 0430 04/06/15 0257  WBC 6.9 8.4 6.2 7.4 6.1  --   NEUTROABS 5.0 6.6 4.5 5.4 4.1  --   HGB 8.2* 8.4* 7.2* 7.8* 7.5* 8.5*  HCT 26.6* 27.1* 22.7* 25.0* 23.6* 27.4*  MCV 97.8 97.5 97.4 96.2  97.1  --   PLT 430* 424* 359 379 356  --    Cardiac Enzymes: No results for input(s): CKTOTAL, CKMB, CKMBINDEX, TROPONINI in the last 168 hours. BNP (last 3 results)  Recent Labs  03/15/15 2246  BNP 314.2*    ProBNP (last 3 results) No results for input(s): PROBNP in the last 8760 hours.  CBG: No results for input(s): GLUCAP in the last 168 hours.  No results found for this or any previous visit (from the past 240 hour(s)).   Studies:  Recent x-ray studies have been reviewed in detail by the Attending Physician  Scheduled Meds:  Scheduled Meds: . albuterol  2.5 mg Nebulization BID  . antiseptic oral rinse  7 mL Mouth Rinse q12n4p  .  chlorhexidine  15 mL Mouth Rinse BID  . enoxaparin (LOVENOX) injection  40 mg Subcutaneous Q24H  . feeding supplement (ENSURE ENLIVE)  237 mL Oral BID BM  . hydroxypropyl methylcellulose / hypromellose  1 drop Both Eyes TID  . latanoprost  1 drop Right Eye QHS  . LORazepam  0.5 mg Intravenous TID  . polyethylene glycol  17 g Oral Daily  . predniSONE  10 mg Oral Q1200  . pyridostigmine  60 mg Oral 4 times per day    Time spent on care of this patient: 40 mins   Zayden Hahne, Geraldo Docker , MD  [facility] Office  [phone] Pager - [phone]  On-Call/Text Page:      [email or web address]      password [email or web address]  If 7PM-7AM, please contact night-coverage [email or web address] Password [email or web address] 04/06/2015, 9:27 AM   LOS: 22 days   Care during the described time interval was provided by me .  I have reviewed this patient's available data, including medical history, events of note, physical examination, and all test results as part of my evaluation. I have personally reviewed and interpreted all radiology studies.   Dia Crawford, MD [phone] Pager

## 2015-04-06 NOTE — Progress Notes (Signed)
Pt unable to do NIF and FVC at this time

## 2015-04-06 NOTE — Progress Notes (Signed)
RT Note:  Patient remains on BIPAP and too weak to perform NIF and VC at this time.

## 2015-04-06 NOTE — Progress Notes (Signed)
Daily Progress Note   Patient Name: Cheyenne Boone       Date: 04/06/2015 DOB: 05/14/1924  Age: 81 y.o. MRN#: [id number] Attending Physician: Drema Dallasurtis J Woods, MD Primary Care Physician: Barbette ReichmannHANDE,VISHWANATH, MD Admit Date: 03/15/2015  Reason for Consultation/Follow-up: Establishing goals of care  Subjective: Cheyenne Boone is very sleepy this morning and says she had a rough night and just wants to sleep - has no other complaints. I attempted to call daughter, Baltazar ApoBrenda Lee, multiple times today but no answer and voicemail full. I have spoken with daughter, Lynden AngCathy, who understands that Cheyenne Boone continues to aspirate with severe aspiration with a small amount of breakfast this morning and she is eating mostly very little other than the occasional meal she will eat ~50% but otherwise only a few bites. Discussed use of medication to manage secretions vs NTS as a comfort measure. Prognosis continues to be very poor and Lynden AngCathy wishes to proceed with transition [place]. Lynden AngCathy says she has discussed with her sister. Emotional support provided.   Discussed with Dr. Joseph ArtWoods, RN, CMRN, CSW.   Interval Events: 10/23: Decided to give another 48 hrs and then likely placement in hospice facility.   Length of Stay: 22 days  Current Medications: Scheduled Meds:  . albuterol  2.5 mg Nebulization BID  . antiseptic oral rinse  7 mL Mouth Rinse q12n4p  . chlorhexidine  15 mL Mouth Rinse BID  . enoxaparin (LOVENOX) injection  40 mg Subcutaneous Q24H  . feeding supplement (ENSURE ENLIVE)  237 mL Oral BID BM  . hydroxypropyl methylcellulose / hypromellose  1 drop Both Eyes TID  . latanoprost  1 drop Right Eye QHS  . LORazepam  0.5 mg Intravenous TID  . polyethylene glycol  17 g  Oral Daily  . predniSONE  10 mg Oral Q1200  . pyridostigmine  60 mg Oral 4 times per day    Continuous Infusions:    PRN Meds: albuterol, camphor-menthol, diphenhydrAMINE, haloperidol lactate, hydrALAZINE, morphine CONCENTRATE, ondansetron (ZOFRAN) IV, RESOURCE THICKENUP CLEAR  Physical Exam: Physical Exam  Constitutional: She appears well-developed.  HENT:  Temporal muscle wasting  Cardiovascular: Normal rate and regular rhythm.   Pulmonary/Chest: Effort normal. She has rales.  Audible secretions  Abdominal: Soft. She exhibits no distension. There is no tenderness.  Musculoskeletal: Normal range of motion.  Neurological: She is disoriented.  Sleeping, arousable, confused  Psychiatric: She has a normal mood and affect.                Vital Signs: BP 119/58 mmHg  Pulse 99  Temp(Src) 98.5 F (36.9 C) (Oral)  Resp 22  Ht  (1.422 m)  Wt 58.4 kg (128 lb 12 oz)  BMI 28.88 kg/m2  SpO2 97% SpO2: SpO2: 97 % O2 Device: O2 Device: Nasal Cannula O2 Flow Rate: O2 Flow Rate (L/min): 2 L/min  Intake/output summary:   Intake/Output Summary (Last 24 hours) at 04/06/15 1349 Last data filed at 04/05/15 2000  Gross per 24 hour  Intake      0 ml  Output      0 ml  Net      0 ml   Baseline Weight: Weight: 49.1 kg (108 lb 3.9 oz) Most recent weight: Weight: 58.4 kg (128 lb 12 oz)       Palliative Assessment/Data: Flowsheet Rows        Most Recent Value   Intake Tab    Referral Department  Hospitalist   Unit at Time of Referral  Intermediate Care Unit   Palliative Care Primary Diagnosis  Neurology   Date Notified  04/02/15   Palliative Care Type  New Palliative care   Reason for referral  Clarify Goals of Care, Non-pain Symptom   Date of Admission  03/15/15   Date first seen by Palliative Care  04/03/15   # of days Palliative referral response time  1 Day(s)   # of days IP prior to Palliative referral  18   Clinical Assessment    Palliative Performance Scale Score  20%    Pain Max last 24 hours  Not able to report   Pain Min Last 24 hours  Not able to report   Dyspnea Max Last 24 Hours  Not able to report   Dyspnea Min Last 24 hours  Not able to report   Nausea Max Last 24 Hours  Not able to report   Nausea Min Last 24 Hours  Not able to report   Anxiety Max Last 24 Hours  Not able to report   Anxiety Min Last 24 Hours  Not able to report   Other Max Last 24 Hours  Not able to report   Psychosocial & Spiritual Assessment    Social Work Plan of Care  Clarified patient/family wishes with healthcare team   Palliative Care Outcomes    Patient/Family meeting held?  Yes   Who was at the meeting?  Daughter Racheal Patches   Palliative Care Outcomes  Improved non-pain symptom therapy, Counseled regarding hospice, Changed to focus on comfort, Provided psychosocial or spiritual support, Clarified goals of care   Patient/Family wishes: Interventions discontinued/not started   Mechanical Ventilation, BiPAP, Vasopressors, PEG   Palliative Care follow-up planned  No   Other Treatment Preference Instructions  monitoring for the next 48 hours for imrpvoement then likely comfort care      Additional Data Reviewed: Recent Labs     04/04/15  0500  04/05/15  0430  04/06/15  0257  WBC  7.4  6.1   --   HGB  7.8*  7.5*  8.5*  PLT  379  356   --   NA  140   --    --   BUN  16   --    --   CREATININE  0.73   --    --      Problem List:  Patient Active Problem List   Diagnosis Date Noted  . Palliative care encounter   . Hypokalemia   . Hypomagnesemia   . Hyperglycemia   . Aspiration pneumonia due to vomit (H[facility])   . Respiratory alkalosis   . Myasthenia gravis (H[facility])   . Encounter for feeding tube placement   . Pneumonia   . Acute respiratory failure (H[facility])   . Protein-calorie malnutrition, severe 03/16/2015  . Neuromuscular weakness (HCC)   . Acute renal failure (HCC)   . Aspiration pneumonia (HCC)   . Encounter for central line placement   . Acute  respiratory failure with hypoxemia (HCC) 03/15/2015  . Dyspnea   . Respiratory failure (HCC)   . Acute respiratory failure with hypoxia (HCC)   . Dysphagia 03/09/2015  . Hip fracture (HCC) 10/31/2014     Palliative Care Assessment & Plan    1.Code Status:  DNR    Code Status Orders        Start     Ordered   03/25/15 1049  Do not attempt resuscitation (DNR)   Continuous    Question Answer Comment  In the event of cardiac or respiratory ARREST Do not call a "code blue"   In the event of cardiac or respiratory ARREST Do not perform Intubation, CPR, defibrillation or ACLS   In the event of cardiac or respiratory ARREST Use medication by any route, position, wound care, and other measures to relive pain and suffering. May use oxygen, suction and manual treatment of airway obstruction as needed for comfort.      03/25/15 1049       2. Goals of Care:  Transition to comfort care.   Desire for further Chaplaincy support:no  Psycho-social Needs: Caregiving  Support/Resources  3. Symptom Management:      Anxiety: Continue ativan as ordered.  Dyspnea: Utilize morphine prn.  Secretions: Robinul prn.     4. Palliative Prophylaxis:   Bowel Regimen, Delirium Protocol, Oral Care and Turn Reposition  5. Prognosis: < 2 weeks - with continued severe aspiration.   6. Discharge Planning:  Hospice facility   Thank you for allowing the Palliative Medicine Team to assist in the care of this patient.   Time In: 0920 Time Out: 1000 Total Time Prolonged Time Billed  no        Ulice Bold, NP  04/06/2015, 1:49 PM  Please contact Palliative Medicine Team phone at [phone] for questions and concerns.

## 2015-04-07 MED ORDER — ONDANSETRON HCL 4 MG/2ML IJ SOLN: 4.0000 mg | Freq: Four times a day (QID) | INTRAMUSCULAR | Status: AC | PRN

## 2015-04-07 MED ORDER — MORPHINE SULFATE (CONCENTRATE) 10 MG/0.5ML PO SOLN: 2.5000 mg | ORAL | Status: AC | PRN

## 2015-04-07 MED ORDER — PREDNISONE 10 MG PO TABS: 10.0000 mg | ORAL_TABLET | Freq: Every day | ORAL | Status: AC

## 2015-04-07 MED ORDER — HYPROMELLOSE (GONIOSCOPIC) 2.5 % OP SOLN: 1.0000 [drp] | Freq: Three times a day (TID) | OPHTHALMIC | Status: AC

## 2015-04-07 MED ORDER — ALBUTEROL SULFATE (2.5 MG/3ML) 0.083% IN NEBU: 2.5000 mg | INHALATION_SOLUTION | RESPIRATORY_TRACT | Status: AC | PRN

## 2015-04-07 MED ORDER — LORAZEPAM 0.5 MG PO TABS: 0.5000 mg | ORAL_TABLET | Freq: Three times a day (TID) | ORAL | Status: DC

## 2015-04-07 MED ORDER — DIPHENHYDRAMINE HCL 12.5 MG/5ML PO ELIX: 12.5000 mg | ORAL_SOLUTION | Freq: Four times a day (QID) | ORAL | Status: AC | PRN

## 2015-04-07 MED ORDER — PYRIDOSTIGMINE BROMIDE 60 MG/5ML PO SYRP: 60.0000 mg | ORAL_SOLUTION | Freq: Four times a day (QID) | ORAL | Status: AC

## 2015-04-07 MED ORDER — GLYCOPYRROLATE 0.2 MG/ML IJ SOLN: 0.2000 mg | INTRAMUSCULAR | Status: AC | PRN

## 2015-04-07 MED ORDER — LORAZEPAM 0.5 MG PO TABS: 0.5000 mg | ORAL_TABLET | Freq: Three times a day (TID) | ORAL | Status: AC

## 2015-04-07 NOTE — Discharge Summary (Signed)
DISCHARGE SUMMARY  Cheyenne Boone  MR#: [id number]  DOB:August 10, 1923  Date of Admission: 03/15/2015 Date of Discharge: 04/07/2015  Attending Physician:Bucky Grigg T  Patient's JXB:JYNWG,NFAOZHYQMV, MD  Consults: PCCM Neurology Palliative Care   Disposition: D/C to Hospice House   Discharge Diagnoses: Acute respiratory failure in setting myasthenia gravis flare Myasthenia gravis crisis Allergy to citrate w/ tongue swelling Agitation Mucus plugging Aspiration PNA - H parahaemolytics on BAL Acute Renal Failure  Normocytic Anemia Large hiatal hernia Dysphagia Hyperglycemia Hypokalemia Hypomag Severe malnutrition in context of chronic illness  Initial presentation: 81 year old female with 10 day history of dysphagia. Was admitted to [facility] for what was suspected to be myasthenia gravis flare. She was started on on IVIG and mestinon, however eventually required intubation. Transferred to [facility] at family request for possible PLEX.  Hospital Course:  Significant Events: 9/27 - Admit for dysphagia  9/29 - Transfer to ICU @ [facility] for respiratory failure & intubated>>Bronchoscopy with bilateral secretions & plugging 10/01 - Extubated 10/03 - Reintubated 10/03 - Transfer to [facility] from [facility] 10/4 - HD cath placed 10/5 PLEX planned, EGD for OGT placement. PLEX stopped due to hypotension 10/7 - concern for allergic rxn to UNasyn or plex citrate, improved with treatment 10/8 - delirium severe--> precedex-->PEA short arrest--> woke up 10/13 - Extubated  Acute respiratory failure in setting of newly diagnosed myasthenia gravis flare The patient has been weaned from BiPAP and appears to be tolerating this without great difficulty at the present time - unfortunately however her overall condition continues to decline - her nutritional intake is exceedingly limited - the decision has been made to focus on comfort and quality of life and in keeping with this the patient is  being discharged to a hospice house  Myasthenia gravis crisis Neurology followed - completed 4 rounds of PLEX - mestinon initiated thereafter and titrated to max dose - not felt to be safe for return to PLEX in absence of secured airway given hx of tongue swelling w/ citrate - no further treatment options beyond what she is currently being afforded  Allergy to citrate w/ tongue swelling 5th PLEX tx cancelled as pt no longer intubated   Agitation Appears to have resolved - patient is quite pleasant and conversant at the time of discharge  Mucus plugging S/P bronch & BAL 9/29  Aspiration PNA - H parahaemolytics on BAL Has completed a course of abx tx   Acute Renal Failure  Resolved  Normocytic Anemia No evidence of acute blood loss - hemoglobin waxing and waning   Large hiatal hernia  Dysphagia Cleared for D1 diet w/ honey thick liquids per SLP  Hyperglycemia CBG well controlled   Hypokalemia Supplemented as needed  Hypomag Magnesium at goal  Severe malnutrition in context of chronic illness    Medication List    STOP taking these medications        alendronate 70 MG tablet  Commonly known as:  FOSAMAX     ALPRAZolam 0.25 MG tablet  Commonly known as:  XANAX     amLODipine 2.5 MG tablet  Commonly known as:  NORVASC     docusate sodium 100 MG capsule  Commonly known as:  COLACE     fluticasone 50 MCG/ACT nasal spray  Commonly known as:  FLONASE     HYDROcodone-acetaminophen 5-325 MG tablet  Commonly known as:  NORCO/VICODIN     simvastatin 20 MG tablet  Commonly known as:  ZOCOR      TAKE these medications  albuterol (2.5 MG/3ML) 0.083% nebulizer solution  Commonly known as:  PROVENTIL  Take 3 mLs (2.5 mg total) by nebulization every 4 (four) hours as needed for wheezing or shortness of breath.     diphenhydrAMINE 12.5 MG/5ML elixir  Commonly known as:  BENADRYL  Take 5 mLs (12.5 mg total) by mouth every 6 (six) hours as needed for  itching.     glycopyrrolate 0.2 MG/ML injection  Commonly known as:  ROBINUL  Inject 1 mL (0.2 mg total) into the vein every 4 (four) hours as needed (secretions).     hydroxypropyl methylcellulose / hypromellose 2.5 % ophthalmic solution  Commonly known as:  ISOPTO TEARS / GONIOVISC  Place 1 drop into both eyes 3 (three) times daily.     latanoprost 0.005 % ophthalmic solution  Commonly known as:  XALATAN  Place 1 drop into the left eye at bedtime.     LORazepam 0.5 MG tablet  Commonly known as:  ATIVAN  Take 1 tablet (0.5 mg total) by mouth 3 (three) times daily.     morphine CONCENTRATE 10 MG/0.5ML Soln concentrated solution  Take 0.13-0.25 mLs (2.6-5 mg total) by mouth every 2 (two) hours as needed for severe pain (shortness of breath or pain).     ondansetron 4 MG/2ML Soln injection  Commonly known as:  ZOFRAN  Inject 2 mLs (4 mg total) into the vein every 6 (six) hours as needed for nausea or vomiting.     polyethylene glycol packet  Commonly known as:  MIRALAX / GLYCOLAX  Take 17 g by mouth daily as needed for mild constipation or moderate constipation.     predniSONE 10 MG tablet  Commonly known as:  DELTASONE  Take 1 tablet (10 mg total) by mouth daily at 12 noon.     pyridostigmine 60 MG/5ML syrup  Commonly known as:  MESTINON  Take 5 mLs (60 mg total) by mouth every 6 (six) hours.       Day of Discharge BP 165/81 mmHg  Pulse 100  Temp(Src) 97.5 F (36.4 C) (Axillary)  Resp 24  Ht  (1.422 m)  Wt 56.8 kg (125 lb 3.5 oz)  BMI 28.09 kg/m2  SpO2 97%  Physical Exam: General: No acute respiratory distress on day of d/c - cachectic  Lungs: Clear to auscultation bilaterally without wheezes or crackles Cardiovascular: Regular rate and rhythm without murmur gallop or rub Abdomen: Nontender, nondistended, soft, bowel sounds positive, no rebound, no ascites, no appreciable mass Extremities: No significant cyanosis, clubbing, or edema bilateral lower  extremities  Basic Metabolic Panel:  Recent Labs Lab 04/01/15 0415 04/02/15 0435 04/03/15 0515 04/04/15 0500 04/05/15 0430  NA 145 142 141 140  --   K 4.0 3.6 3.6 3.5  --   CL 113* 110 108 108  --   CO2 --   GLUCOSE 108* 118* 97 96  --   BUN --   CREATININE 0.75 0.78 0.72 0.73  --   CALCIUM 9.0 8.9 8.4* 8.5*  --   MG 2.1 2.0 2.0 1.8 1.8    Liver Function Tests:  Recent Labs Lab 04/01/15 0415 04/02/15 0435 04/03/15 0515 04/04/15 0500  AST ALT 12* 12* 12* 14  ALKPHOS 108 119 108 119  BILITOT 0.9 0.5 0.6 0.6  PROT 5.4* 5.5* 4.8* 5.1*  ALBUMIN 2.7* 2.8* 2.5* 2.6*   CBC:  Recent Labs Lab 04/01/15 0415 04/02/15 0435  04/03/15 0515 04/04/15 0500 04/05/15 0430 04/06/15 0257  WBC 6.9 8.4 6.2 7.4 6.1  --   NEUTROABS 5.0 6.6 4.5 5.4 4.1  --   HGB 8.2* 8.4* 7.2* 7.8* 7.5* 8.5*  HCT 26.6* 27.1* 22.7* 25.0* 23.6* 27.4*  MCV 97.8 97.5 97.4 96.2 97.1  --   PLT 430* 424* 359 379 356  --    BNP (last 3 results)  Recent Labs  03/15/15 2246  BNP 314.2*   CBG:  Recent Labs Lab 04/06/15 1214  GLUCAP 96   Time spent in discharge (includes decision making & examination of pt): >35 minutes  04/07/2015, 1:52 PM   Lonia BloodJeffrey T. Ignatius Kloos, MD [facility] Office  [phone] Pager [phone]  On-Call/Text Page:      [email or web address]      password [email or web address]

## 2015-04-07 NOTE — Progress Notes (Signed)
[facility] Anticipated discharge date:04/07/15 Family notified: pt daughter- Vassie [employer] by [employer]- called at 2:30pm  CSW signing off.  Merlyn LotJenna Holoman, LCSWA Clinical Social Worker [phone]

## 2015-04-07 NOTE — Progress Notes (Signed)
Pt unable to do NIF or VC at this time.

## 2015-04-07 NOTE — Progress Notes (Signed)
Pt DC to hospice facility report called to Amy nurse. Belongings sent with patient including dentures and glasses, eye drops sent. Transportation picked up pt at 1600, on 2LPM O2 per [place]. PIV DC, hemostasis achieved. Pt sent in paper gown. VSS. eICU and CCMD notified of facility transfer.

## 2015-04-07 NOTE — Progress Notes (Addendum)
12:30pm [facility] can accept patient today- pt dtr aware and agreeable  8am CSW [facility]- CSW made referral to facility  CSW will continue to follow  Merlyn LotJenna Holoman, [facility]SWA Clinical Social Worker [phone]

## 2015-04-07 NOTE — Progress Notes (Addendum)
Pt on 2L N/C and vitals WNL. Bipap not needed. No distress noted.

## 2015-04-07 NOTE — Progress Notes (Signed)
Speech Language Pathology Treatment: Dysphagia  Patient Details Name: Cheyenne GoldKatherine M Petruska MRN: [id number] DOB: 07/30/1923 Today's Date: 04/07/2015 Time: [id number] SLP Time Calculation (min) (ACUTE ONLY): 16 min  Assessment / Plan / Recommendation Clinical Impression  Pt demonstrates function stable in comparison with Monday. Pt is able to tolerate small meals, alternating teaspoon bites of honey and puree with cues for an intermittent throat clear. Aspiration risk persists and overt hard coughing observed with any thinner liquids. Given decision for hospice care, recommend pt continue current diet and precautions unless full comfort diet is desired. Suspect alteration of textures will result in increased discomfort due to coughing and lack of dentition. No SLP f/u needed, will sign off.    HPI     Pertinent Vitals    SLP Plan  Continue with current plan of care    Recommendations Diet recommendations: Dysphagia 1 (puree);Honey-thick liquid Liquids provided via: Teaspoon Medication Administration: Crushed with puree Supervision: Full supervision/cueing for compensatory strategies;Trained caregiver to feed patient;Staff to assist with self feeding Compensations: Slow rate;Small sips/bites;Follow solids with liquid;Externally pace;Clear throat intermittently              Plan: Continue with current plan of care    GO     Izola Teague, Riley NearingBonnie Caroline 04/07/2015, 10:49 AM

## 2015-04-07 NOTE — Discharge Instructions (Signed)
Hospice °Hospice is a service that is designed to provide people who are terminally ill and their families with medical, spiritual, and psychological support. Its aim is to improve your quality of life by keeping you as alert and comfortable as possible. Hospice is performed by a team of health care professionals and volunteers who: °· Help keep you comfortable. Hospice can be provided in your home or in a homelike setting. The hospice staff works with your family and friends to help meet your needs. You will enjoy the support of loved ones by receiving much of your basic care from family and friends. °· Provide pain relief and manage your symptoms. The staff supply all necessary medicines and equipment. °· Provide companionship when you are alone. °· Allow you and your family to rest. They may do light housekeeping, prepare meals, and run errands. °· Provide counseling. They will make sure your emotional, spiritual, and social needs and those of your family are being met. °· Provide spiritual care. Spiritual care is individualized to meet your needs and your family's needs. It may involve helping you look at what death means to you, say goodbye, or perform a specific religious ceremony or ritual. °Hospice teams often include: °· A nurse. °· A doctor. °· Social workers. °· Religious leaders (such as a chaplain). °· Trained volunteers. °WHEN SHOULD HOSPICE CARE BEGIN? °Most people who use hospice are believed to have fewer than 6 months to live. Your family and health care providers can help you decide when hospice services should begin. If your condition improves, you may discontinue the program. °WHAT SHOULD I CONSIDER BEFORE SELECTING A PROGRAM? °Most hospice programs are run by nonprofit, independent organizations. Some are affiliated with hospitals, nursing homes, or home health care agencies. Hospice programs can take place in the home or at a hospice center, hospital, or skilled nursing facility. When choosing  a hospice program, ask the following questions: °· What services are available to me? °· What services are offered to my loved ones? °· How involved are my loved ones? °· How involved is my health care provider? °· Who makes up the hospice care team? How are they trained or screened? °· How will my pain and symptoms be managed? °· If my circumstances change, can the services be provided in a different setting, such as my home or in the hospital? °· Is the program reviewed and licensed by the state or certified in some other way? °WHERE CAN I LEARN MORE ABOUT HOSPICE? °You can learn about existing hospice programs in your area from your health care providers. You can also read more about hospice online. The websites of the following organizations contain helpful information: °· The National Hospice and Palliative Care Organization (NHPCO). °· The Hospice Association of America (HAA). °· The Hospice Education Institute. °· The American Cancer Society (ACS). °· Hospice Net. °  °This information is not intended to replace advice given to you by your health care provider. Make sure you discuss any questions you have with your health care provider. °  °Document Released: 09/15/2003 Document Revised: 06/03/2013 Document Reviewed: 04/08/2013 °Elsevier Interactive Patient Education ©2016 Elsevier Inc. ° °

## 2015-04-13 DEATH — deceased

## 2015-04-24 LAB — ACID FAST SMEAR+CULTURE W/RFLX (ARMC ONLY)
ACID FAST SMEAR: NEGATIVE
Acid Fast Culture: NEGATIVE

## 2015-06-01 ENCOUNTER — Other Ambulatory Visit: Payer: Self-pay | Admitting: *Deleted

## 2015-06-01 NOTE — Patient Outreach (Signed)
[facility] Care Management  06/01/2015  Cheyenne GoldKatherine M Kiedrowski 02/03/1924 [id number]   Subjective:  Telephone call to patient's home number, no answer and message states phone has been disconnected or is no longer in service.  Telephone call to patient's primary MD's office (Dr. Lin GivensVishwanatha Hande), spoke with Morrie SheldonAshley, HIPAA verified, states they were notified on 04/12/15 by the Hospice House that patient died on 08/13/14.       Objective: Received [employer] Tier 4 list referral on 05/31/15.  Diagnosis not listed on referral, patient with 3 ED visits and 3 admits.   Per Epic review, patient [facility] on 04/07/15 for comfort care.   Assessment:  Referral follow up completed.   RNCM will not send case closure letter to primary MD since primary MD is aware of patient's death.    Plan: RNCM will notify Damita Rhodie to close case due to patient's death on 08/13/14.    Sadonna Kotara H. Gardiner Barefootooper RN, BSN, CCM [facility] Care Management [facility] Telephonic CM Phone: [phone] Fax: [phone]

## 2016-03-02 IMAGING — CR DG CHEST 1V PORT
1 series · 1 of 1 positions shown · non-contrast
Comparison: 03/24/2015

CLINICAL DATA: Acute respiratory failure

EXAM:
PORTABLE CHEST 1 VIEW

[AP]
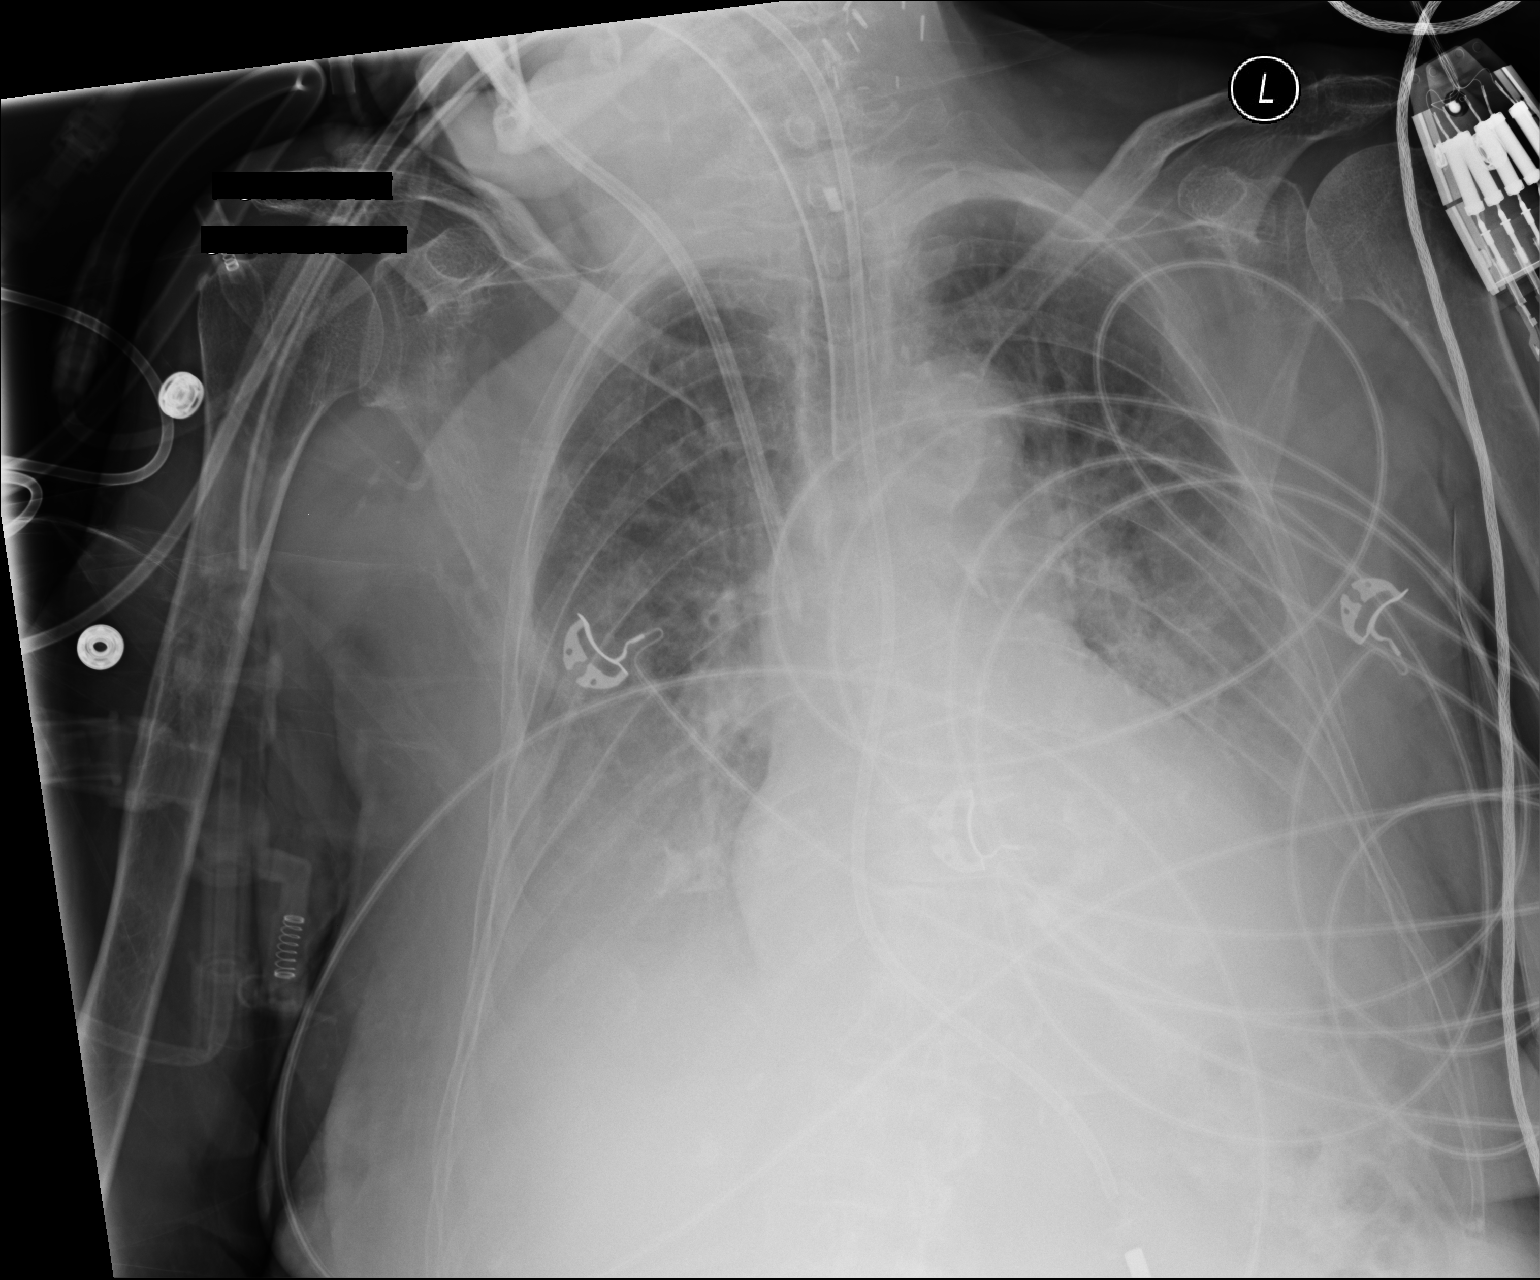

[1 of 1 positions shown; findings below may reference images not displayed]

FINDINGS: Endotracheal tube in good position. Right jugular catheter tip SVC
unchanged. Feeding tube in the proximal stomach.

Bibasilar consolidation unchanged. This may represent atelectasis or
pneumonia. Bilateral effusions unchanged. Vascular congestion with
possible fluid overload.
IMPRESSION: Support lines remain in good position and are unchanged

Bilateral airspace disease with bibasilar consolidation and
bilateral effusions unchanged. Possible pneumonia versus congestive
heart failure.

## 2016-03-04 IMAGING — CR DG CHEST 1V PORT
1 series · 1 of 1 positions shown · non-contrast
Comparison: 03/26/2015

CLINICAL DATA: Acute respiratory failure.  Hypertension and stroke.

EXAM:
PORTABLE CHEST 1 VIEW

[AP]
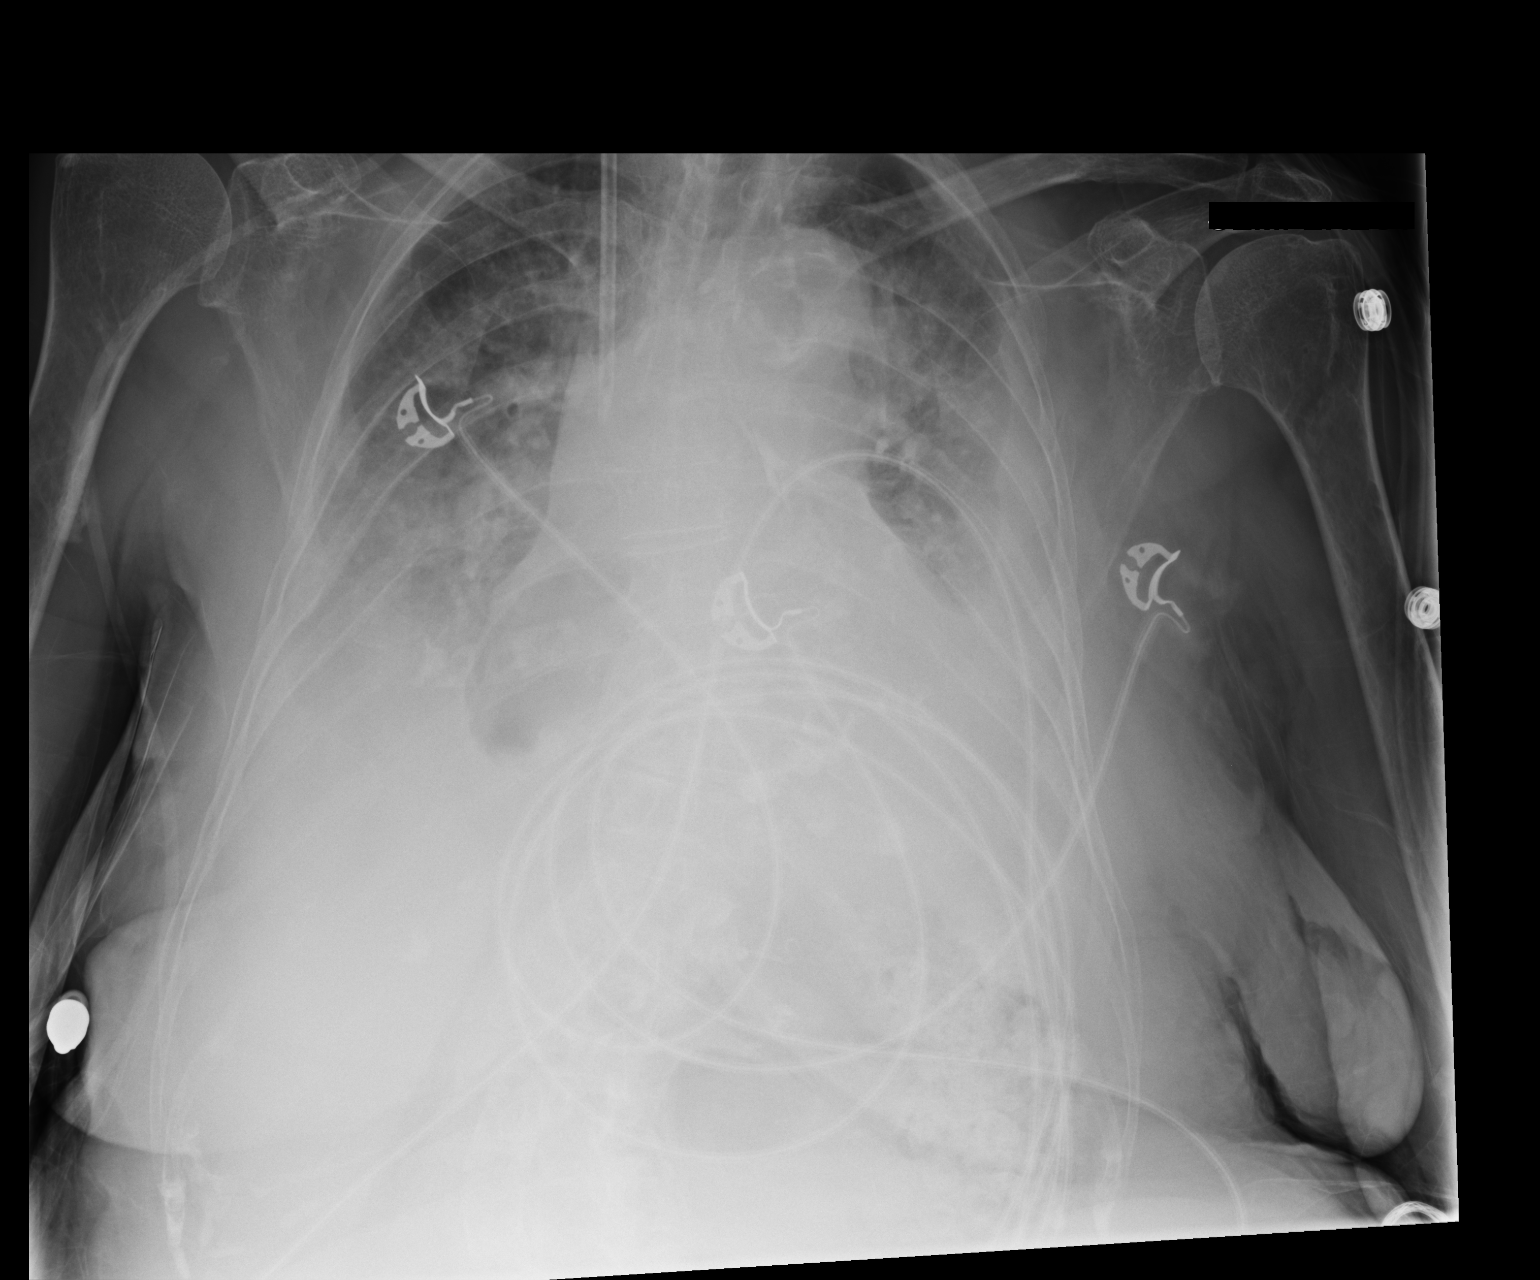

[1 of 1 positions shown; findings below may reference images not displayed]

FINDINGS: Right IJ Cordis sheath unchanged. Midline trachea. Cardiomegaly
accentuated by AP portable technique. Increase in small to moderate
bilateral pleural effusions. Suspect loculation laterally of the
left-sided effusion. No pneumothorax. Interstitial edema is
moderate. Similar left and worsened right base airspace disease.
Numerous leads and wires project over the chest.
IMPRESSION: Congestive heart failure with increased bilateral pleural effusions.
Probable loculation of the left-sided effusion.

Worsened right and similar left base atelectasis or infection.
